# Patient Record
Sex: Female | Born: 1940 | Race: White | Hispanic: No | Smoking: Never smoker
Health system: Southern US, Community
[De-identification: ages and names within clinical notes are randomized; demographics above are authoritative.]

## PROBLEM LIST (undated history)

## (undated) DIAGNOSIS — N200 Calculus of kidney: Secondary | ICD-10-CM

## (undated) DIAGNOSIS — J449 Chronic obstructive pulmonary disease, unspecified: Secondary | ICD-10-CM

## (undated) DIAGNOSIS — M1990 Unspecified osteoarthritis, unspecified site: Secondary | ICD-10-CM

## (undated) DIAGNOSIS — I38 Endocarditis, valve unspecified: Secondary | ICD-10-CM

## (undated) DIAGNOSIS — J45909 Unspecified asthma, uncomplicated: Secondary | ICD-10-CM

## (undated) DIAGNOSIS — I8390 Asymptomatic varicose veins of unspecified lower extremity: Secondary | ICD-10-CM

## (undated) DIAGNOSIS — I10 Essential (primary) hypertension: Secondary | ICD-10-CM

## (undated) DIAGNOSIS — C801 Malignant (primary) neoplasm, unspecified: Secondary | ICD-10-CM

## (undated) DIAGNOSIS — E079 Disorder of thyroid, unspecified: Secondary | ICD-10-CM

## (undated) DIAGNOSIS — G4730 Sleep apnea, unspecified: Secondary | ICD-10-CM

## (undated) HISTORY — DX: Essential (primary) hypertension: I10

## (undated) HISTORY — PX: TONSILLECTOMY: SUR1361

## (undated) HISTORY — DX: Chronic obstructive pulmonary disease, unspecified: J44.9

## (undated) HISTORY — PX: CATARACT EXTRACTION: SUR2

## (undated) HISTORY — PX: VEIN SURGERY: SHX48

## (undated) HISTORY — DX: Disorder of thyroid, unspecified: E07.9

## (undated) HISTORY — DX: Asymptomatic varicose veins of unspecified lower extremity: I83.90

## (undated) HISTORY — DX: Calculus of kidney: N20.0

## (undated) HISTORY — DX: Unspecified osteoarthritis, unspecified site: M19.90

## (undated) HISTORY — DX: Sleep apnea, unspecified: G47.30

## (undated) HISTORY — PX: OTHER SURGICAL HISTORY: SHX169

## (undated) HISTORY — DX: Endocarditis, valve unspecified: I38

---

## 2005-01-22 ENCOUNTER — Ambulatory Visit: Payer: Self-pay | Admitting: Family Medicine

## 2005-07-02 ENCOUNTER — Ambulatory Visit: Payer: Self-pay | Admitting: Family Medicine

## 2005-10-27 ENCOUNTER — Emergency Department: Payer: Self-pay | Admitting: Emergency Medicine

## 2005-10-29 ENCOUNTER — Ambulatory Visit: Payer: Self-pay | Admitting: Family Medicine

## 2006-09-23 ENCOUNTER — Ambulatory Visit: Payer: Self-pay | Admitting: Family Medicine

## 2006-12-17 ENCOUNTER — Ambulatory Visit: Payer: Self-pay | Admitting: Unknown Physician Specialty

## 2008-02-10 ENCOUNTER — Ambulatory Visit: Payer: Self-pay | Admitting: Family Medicine

## 2008-09-06 ENCOUNTER — Ambulatory Visit: Payer: Self-pay | Admitting: Urology

## 2008-09-11 ENCOUNTER — Ambulatory Visit: Payer: Self-pay | Admitting: Urology

## 2008-09-14 ENCOUNTER — Ambulatory Visit: Payer: Self-pay | Admitting: Urology

## 2008-09-19 ENCOUNTER — Ambulatory Visit: Payer: Self-pay | Admitting: Urology

## 2008-09-22 ENCOUNTER — Ambulatory Visit: Payer: Self-pay | Admitting: Urology

## 2008-09-27 ENCOUNTER — Ambulatory Visit: Payer: Self-pay | Admitting: Urology

## 2008-09-29 ENCOUNTER — Ambulatory Visit: Payer: Self-pay | Admitting: Urology

## 2008-10-12 ENCOUNTER — Ambulatory Visit: Payer: Self-pay | Admitting: Urology

## 2008-11-06 ENCOUNTER — Ambulatory Visit: Payer: Self-pay | Admitting: Urology

## 2008-12-05 ENCOUNTER — Ambulatory Visit: Payer: Self-pay | Admitting: Unknown Physician Specialty

## 2008-12-12 ENCOUNTER — Ambulatory Visit: Payer: Self-pay | Admitting: Unknown Physician Specialty

## 2009-01-24 LAB — HM PAP SMEAR: HM Pap smear: NORMAL

## 2009-02-13 ENCOUNTER — Ambulatory Visit: Payer: Self-pay | Admitting: Family Medicine

## 2009-05-08 ENCOUNTER — Ambulatory Visit: Payer: Self-pay | Admitting: Urology

## 2009-10-22 ENCOUNTER — Ambulatory Visit: Payer: Self-pay | Admitting: Family Medicine

## 2009-12-26 ENCOUNTER — Ambulatory Visit: Payer: Self-pay | Admitting: Unknown Physician Specialty

## 2010-02-27 ENCOUNTER — Ambulatory Visit: Payer: Self-pay | Admitting: Internal Medicine

## 2010-03-26 ENCOUNTER — Inpatient Hospital Stay: Payer: Self-pay | Admitting: Internal Medicine

## 2010-06-10 ENCOUNTER — Ambulatory Visit: Payer: Self-pay | Admitting: Physician Assistant

## 2010-06-19 ENCOUNTER — Ambulatory Visit: Payer: Self-pay | Admitting: Unknown Physician Specialty

## 2010-06-21 LAB — PATHOLOGY REPORT

## 2010-09-12 ENCOUNTER — Ambulatory Visit: Payer: Self-pay | Admitting: Unknown Physician Specialty

## 2011-03-18 ENCOUNTER — Ambulatory Visit: Payer: Self-pay | Admitting: Internal Medicine

## 2011-03-31 ENCOUNTER — Encounter: Payer: Self-pay | Admitting: Internal Medicine

## 2011-04-11 ENCOUNTER — Encounter: Payer: Self-pay | Admitting: Internal Medicine

## 2011-05-11 ENCOUNTER — Encounter: Payer: Self-pay | Admitting: Internal Medicine

## 2011-07-02 ENCOUNTER — Ambulatory Visit (INDEPENDENT_AMBULATORY_CARE_PROVIDER_SITE_OTHER): Payer: Medicare Other | Admitting: Internal Medicine

## 2011-07-02 ENCOUNTER — Encounter: Payer: Self-pay | Admitting: Internal Medicine

## 2011-07-02 ENCOUNTER — Telehealth: Payer: Self-pay | Admitting: Internal Medicine

## 2011-07-02 DIAGNOSIS — E039 Hypothyroidism, unspecified: Secondary | ICD-10-CM | POA: Insufficient documentation

## 2011-07-02 DIAGNOSIS — R319 Hematuria, unspecified: Secondary | ICD-10-CM

## 2011-07-02 DIAGNOSIS — Z136 Encounter for screening for cardiovascular disorders: Secondary | ICD-10-CM

## 2011-07-02 DIAGNOSIS — E669 Obesity, unspecified: Secondary | ICD-10-CM

## 2011-07-02 DIAGNOSIS — K219 Gastro-esophageal reflux disease without esophagitis: Secondary | ICD-10-CM

## 2011-07-02 DIAGNOSIS — R42 Dizziness and giddiness: Secondary | ICD-10-CM | POA: Insufficient documentation

## 2011-07-02 LAB — COMPREHENSIVE METABOLIC PANEL
ALT: 23 U/L (ref 0–35)
AST: 31 U/L (ref 0–37)
Alkaline Phosphatase: 109 U/L (ref 39–117)
CO2: 27 mEq/L (ref 19–32)
Sodium: 142 mEq/L (ref 135–145)
Total Bilirubin: 0.7 mg/dL (ref 0.3–1.2)
Total Protein: 6.9 g/dL (ref 6.0–8.3)

## 2011-07-02 LAB — CBC WITH DIFFERENTIAL/PLATELET
Basophils Relative: 0.4 % (ref 0.0–3.0)
Eosinophils Absolute: 0.1 10*3/uL (ref 0.0–0.7)
Eosinophils Relative: 1.9 % (ref 0.0–5.0)
Hemoglobin: 14 g/dL (ref 12.0–15.0)
MCHC: 34 g/dL (ref 30.0–36.0)
MCV: 92.1 fl (ref 78.0–100.0)
Monocytes Absolute: 0.7 10*3/uL (ref 0.1–1.0)
Neutro Abs: 4.6 10*3/uL (ref 1.4–7.7)
RBC: 4.48 Mil/uL (ref 3.87–5.11)

## 2011-07-02 LAB — POCT URINALYSIS DIPSTICK
Bilirubin, UA: NEGATIVE
Blood, UA: NEGATIVE
Glucose, UA: NEGATIVE
Ketones, UA: NEGATIVE
Spec Grav, UA: 1.025

## 2011-07-02 MED ORDER — MELOXICAM 15 MG PO TABS: 15.0000 mg | ORAL_TABLET | Freq: Every day | ORAL | Status: DC

## 2011-07-02 MED ORDER — OMEPRAZOLE 20 MG PO CPDR: 40.0000 mg | DELAYED_RELEASE_CAPSULE | Freq: Two times a day (BID) | ORAL | Status: DC

## 2011-07-02 MED ORDER — LEVOTHYROXINE SODIUM 75 MCG PO TABS: 75.0000 ug | ORAL_TABLET | Freq: Every day | ORAL | Status: DC

## 2011-07-02 NOTE — Progress Notes (Signed)
Patient was referred to Dr Gwen Pounds on 07/09/11 at 11:15, I spoke with Lupita Leash at [facility] Patient is aware of appt./slj

## 2011-07-02 NOTE — Progress Notes (Signed)
Subjective:    Patient ID: Darlene Robbins, female    DOB: [birth year], 70 y.o.   MRN: [id number]  HPI Comments: Dizziness -  Patient complains of an episode of lightheadedness or dizziness. This was a single event. She reports sensation of lightheadedness upon waking. Feeling that she might "fall over." She was diaphoretic during this time. She denies any chest pain or palpitations. She also denies nausea, vomiting, or diarrhea. She took her pulse during this event and noted her pulse to be below 40. She reports that her symptoms gradually improved and she did not seek medical care for this event. She has been followed in the past by Dr. Gwen Pounds from cardiology. But has not had recent followup with him. She has not had any recurrent events.   Hypothyroidism - Patient with hypothyroidism. She reports some fatigue. But denies hot or cold intolerance, and constipation. Her most recent TSH was normal.  Abdominal Pain This is a chronic problem. The current episode started more than 1 year ago. The problem occurs intermittently. The problem has been waxing and waning. The pain is located in the periumbilical region and suprapubic region. The pain is moderate. The quality of the pain is aching and cramping. The abdominal pain does not radiate. Associated symptoms include dysuria (occasional) and nausea. Pertinent negatives include no arthralgias, constipation, diarrhea, fever, frequency, headaches, hematochezia, hematuria, myalgias, vomiting or weight loss. The pain is aggravated by nothing. The pain is relieved by nothing. She has tried antacids, antibiotics and proton pump inhibitors for the symptoms. The treatment provided mild relief. Prior diagnostic workup includes GI consult. kidney stones and bladder infections    Outpatient Encounter Prescriptions as of 07/02/2011  Medication Sig Dispense Refill  . albuterol (VENTOLIN HFA) 108 (90 BASE) MCG/ACT inhaler Inhale 2 puffs into the lungs daily.        .  diclofenac sodium (VOLTAREN) 1 % GEL Apply 1 application topically 2 (two) times daily as needed.        . fish oil-omega-3 fatty acids 1000 MG capsule Take 2 g by mouth daily.        . furosemide (LASIX) 20 MG tablet Take 20 mg by mouth daily as needed.        Marland Kitchen levothyroxine (SYNTHROID, LEVOTHROID) 75 MCG tablet Take 1 tablet (75 mcg total) by mouth daily.  90 tablet  4  . meloxicam (MOBIC) 15 MG tablet Take 1 tablet (15 mg total) by mouth daily.  90 tablet  4  . metoCLOPramide (REGLAN) 5 MG tablet Take 5 mg by mouth 2 (two) times daily as needed.           Review of Systems  Constitutional: Positive for diaphoresis and fatigue. Negative for fever, chills, weight loss, appetite change and unexpected weight change.  HENT: Negative for ear pain, congestion, sore throat, trouble swallowing, neck pain, voice change and sinus pressure.   Eyes: Negative for visual disturbance.  Respiratory: Negative for cough, chest tightness, shortness of breath, wheezing and stridor.   Cardiovascular: Negative for chest pain, palpitations and leg swelling.       Bradycardia symptomatic, to 40s  Gastrointestinal: Positive for nausea and abdominal pain. Negative for vomiting, diarrhea, constipation, blood in stool, hematochezia, abdominal distention and anal bleeding.  Genitourinary: Positive for dysuria (occasional). Negative for urgency, frequency, hematuria and flank pain.  Musculoskeletal: Negative for myalgias, arthralgias and gait problem.  Skin: Negative for color change and rash.  Neurological: Positive for dizziness. Negative for headaches.  Hematological: Negative  for adenopathy. Does not bruise/bleed easily.  Psychiatric/Behavioral: Negative for sleep disturbance and dysphoric mood. The patient is not nervous/anxious.    BP 158/86  Pulse 73  Temp(Src) 98.2 F (36.8 C) (Oral)  Resp 12  Ht 5' 4.5" (1.638 m)  Wt 262 lb (118.842 kg)  BMI 44.28 kg/m2  SpO2 100%     Objective:   Physical Exam    Constitutional: She is oriented to person, place, and time. She appears well-developed and well-nourished. No distress.  HENT:  Head: Normocephalic and atraumatic.  Nose: Nose normal.  Mouth/Throat: No oropharyngeal exudate.  Eyes: Conjunctivae are normal. Pupils are equal, round, and reactive to light. Right eye exhibits no discharge. Left eye exhibits no discharge. No scleral icterus.  Neck: Normal range of motion. Neck supple. No tracheal deviation present. No thyromegaly present.  Cardiovascular: Normal rate, regular rhythm, normal heart sounds and intact distal pulses.  Exam reveals no gallop and no friction rub.   No murmur heard. Pulmonary/Chest: Effort normal and breath sounds normal. No respiratory distress. She has no wheezes. She has no rales. She exhibits no tenderness.  Abdominal: Soft. She exhibits no distension. There is no tenderness. There is no guarding.  Musculoskeletal: Normal range of motion. She exhibits no edema and no tenderness.  Lymphadenopathy:    She has no cervical adenopathy.  Neurological: She is alert and oriented to person, place, and time. No cranial nerve deficit. She exhibits normal muscle tone. Coordination normal.  Skin: Skin is warm and dry. No rash noted. She is not diaphoretic. No erythema. No pallor.  Psychiatric: She has a normal mood and affect. Her behavior is normal. Judgment and thought content normal.          Assesment & Plan:  1. Lightheadedness -  Patient with a single episode of lightheadedness, during which time she describes being bradycardic to the 40s. She has been followed by Dr. Gwen Pounds from cardiology in the past. We will plan to set her up for followup appointment with him for evaluation to include a Holter monitor. She had an EKG performed in clinic today which was normal. She will also have electrolytes and thyroid lab work today. She will followup in our clinic in 2 weeks.  2. Hypothyroidism - Patient with hypothyroidism  which has been stable. Will recheck TSH with labs today.  3. Abdominal pain - Patient has chronic intermittent abdominal pain. She has no symptoms to suggest GI etiology and has had extensive workup in the past. She has a history of urinary tract infections in the past and has mild dysuria today. We will repeat a urinalysis and urine culture today.

## 2011-07-03 NOTE — Progress Notes (Signed)
Addended by: Melody Comas L on: 07/03/2011 11:45 AM   Modules accepted: Orders

## 2011-07-04 NOTE — Telephone Encounter (Signed)
I have faxed notes to [facility] to Dr. Vanessa Ralphs.,MD.  Ph  [phone].

## 2011-07-06 LAB — URINE CULTURE

## 2011-07-10 ENCOUNTER — Other Ambulatory Visit: Payer: Self-pay | Admitting: *Deleted

## 2011-07-10 MED ORDER — SULFAMETHOXAZOLE-TRIMETHOPRIM 800-160 MG PO TABS: 1.0000 | ORAL_TABLET | Freq: Two times a day (BID) | ORAL | Status: AC

## 2011-07-30 ENCOUNTER — Telehealth: Payer: Self-pay | Admitting: Internal Medicine

## 2011-07-30 NOTE — Telephone Encounter (Signed)
Pt received generic synriod from her mail order ph[facility].  Dr Dan Humphreys stated she did not want pt to take generic.  In order for her to returned this she needs a note from dr walker stated that she doesn't want her to take the generic synroid.  Then she needs another rx stated brand name only of synroid.

## 2011-07-31 ENCOUNTER — Encounter: Payer: Self-pay | Admitting: Internal Medicine

## 2011-07-31 NOTE — Telephone Encounter (Signed)
Fine to call in brand name synthroid and/or write a letter if she needs it.

## 2011-08-01 ENCOUNTER — Other Ambulatory Visit: Payer: Self-pay | Admitting: Internal Medicine

## 2011-08-01 MED ORDER — LEVOTHYROXINE SODIUM 75 MCG PO TABS: 75.0000 ug | ORAL_TABLET | Freq: Every day | ORAL | Status: DC

## 2011-08-04 ENCOUNTER — Encounter: Payer: Self-pay | Admitting: Internal Medicine

## 2011-08-04 ENCOUNTER — Ambulatory Visit (INDEPENDENT_AMBULATORY_CARE_PROVIDER_SITE_OTHER): Payer: Medicare Other | Admitting: Internal Medicine

## 2011-08-04 VITALS — BP 139/78 | HR 59 | Temp 97.6°F | Resp 12 | Ht 67.0 in | Wt 262.5 lb

## 2011-08-04 DIAGNOSIS — Z23 Encounter for immunization: Secondary | ICD-10-CM

## 2011-08-04 DIAGNOSIS — E785 Hyperlipidemia, unspecified: Secondary | ICD-10-CM

## 2011-08-04 DIAGNOSIS — R42 Dizziness and giddiness: Secondary | ICD-10-CM

## 2011-08-04 DIAGNOSIS — R300 Dysuria: Secondary | ICD-10-CM

## 2011-08-04 DIAGNOSIS — F4321 Adjustment disorder with depressed mood: Secondary | ICD-10-CM

## 2011-08-04 LAB — POCT URINALYSIS DIPSTICK
Nitrite, UA: NEGATIVE
Protein, UA: NEGATIVE
Spec Grav, UA: 1.01
Urobilinogen, UA: 0.2

## 2011-08-04 MED ORDER — SULFAMETHOXAZOLE-TRIMETHOPRIM 800-160 MG PO TABS: 1.0000 | ORAL_TABLET | Freq: Two times a day (BID) | ORAL | Status: DC

## 2011-08-04 MED ORDER — SULFAMETHOXAZOLE-TRIMETHOPRIM 800-160 MG PO TABS: 1.0000 | ORAL_TABLET | Freq: Two times a day (BID) | ORAL | Status: AC

## 2011-08-04 NOTE — Progress Notes (Signed)
Subjective:    Patient ID: Darlene Robbins, female    DOB: [birth year], 71 y.o.   MRN: [id number]  HPI Darlene Robbins is a 70 year old female who presents to followup dizziness. In the interim since her last visit she was seen by Dr. Gwen Pounds with cardiology and underwent both Holter monitor and echocardiogram which she is scheduled to followup today. She reports her dizziness is unchanged. She continues to have intermittent periods of dizziness described as lightheadedness. She denies any chest pain, diaphoresis, palpitations.  Also in the interim since her last visit she was seen for cognitive evaluation. Her cognitive evaluation was completely normal. The interviewer did notice some mild depression. We discussed this in detail today. Darlene Robbins reports a recent history of several friends passing away. She also reports some anxiety associated with caring for her grandchildren. She does not feel this is severe enough toward medication.  Darlene Robbins reports that after her last visit her symptoms of dysuria and frequency completely resolved with treatment with Bactrim. However, her symptoms have recurred over the last couple of days. She reports having to use the restroom up to every 2 hours. She denies any fever, chills, flank pain.  Outpatient Encounter Prescriptions as of 08/04/2011  Medication Sig Dispense Refill  . albuterol (VENTOLIN HFA) 108 (90 BASE) MCG/ACT inhaler Inhale 2 puffs into the lungs daily.        . diclofenac sodium (VOLTAREN) 1 % GEL Apply 1 application topically 2 (two) times daily as needed.        . fish oil-omega-3 fatty acids 1000 MG capsule Take 2 g by mouth daily.        . furosemide (LASIX) 20 MG tablet Take 20 mg by mouth daily as needed.        Marland Kitchen levothyroxine (SYNTHROID) 75 MCG tablet Take 1 tablet (75 mcg total) by mouth daily.  30 tablet  11  . meloxicam (MOBIC) 15 MG tablet Take 1 tablet (15 mg total) by mouth daily.  90 tablet  4  . metoCLOPramide (REGLAN) 5 MG tablet Take  5 mg by mouth 2 (two) times daily as needed.        . nystatin (MYCOSTATIN) cream       . omeprazole (PRILOSEC) 20 MG capsule Take 2 capsules (40 mg total) by mouth 2 (two) times daily.  180 capsule  4  . sulfamethoxazole-trimethoprim (SEPTRA DS) 800-160 MG per tablet Take 1 tablet by mouth 2 (two) times daily.  14 tablet  0    Review of Systems  Constitutional: Negative for fever, chills, appetite change, fatigue and unexpected weight change.  HENT: Negative for ear pain, congestion, sore throat, trouble swallowing, neck pain, voice change and sinus pressure.   Eyes: Negative for visual disturbance.  Respiratory: Negative for cough, shortness of breath, wheezing and stridor.   Cardiovascular: Negative for chest pain, palpitations and leg swelling.  Gastrointestinal: Negative for nausea, vomiting, abdominal pain, diarrhea, constipation, blood in stool, abdominal distention and anal bleeding.  Genitourinary: Positive for dysuria and frequency. Negative for flank pain.  Musculoskeletal: Negative for myalgias, arthralgias and gait problem.  Skin: Negative for color change and rash.  Neurological: Positive for dizziness. Negative for headaches.  Hematological: Negative for adenopathy. Does not bruise/bleed easily.  Psychiatric/Behavioral: Positive for dysphoric mood. Negative for suicidal ideas and sleep disturbance. The patient is nervous/anxious.    BP 139/78  Pulse 59  Temp(Src) 97.6 F (36.4 C) (Oral)  Resp 12  Ht 5\' 7"  (1.702 m)  Wt 262 lb 8 oz (119.069 kg)  BMI 41.11 kg/m2  SpO2 99%     Objective:   Physical Exam  Constitutional: She is oriented to person, place, and time. She appears well-developed and well-nourished. No distress.  HENT:  Head: Normocephalic and atraumatic.  Right Ear: External ear normal.  Left Ear: External ear normal.  Nose: Nose normal.  Mouth/Throat: Oropharynx is clear and moist. No oropharyngeal exudate.  Eyes: Conjunctivae are normal. Pupils are  equal, round, and reactive to light. Right eye exhibits no discharge. Left eye exhibits no discharge. No scleral icterus.  Neck: Normal range of motion. Neck supple. No tracheal deviation present. No thyromegaly present.  Cardiovascular: Normal rate, regular rhythm, normal heart sounds and intact distal pulses.  Exam reveals no gallop and no friction rub.   No murmur heard. Pulmonary/Chest: Effort normal and breath sounds normal. No respiratory distress. She has no wheezes. She has no rales. She exhibits no tenderness.  Abdominal: There is no CVA tenderness.  Musculoskeletal: Normal range of motion. She exhibits no edema and no tenderness.  Lymphadenopathy:    She has no cervical adenopathy.  Neurological: She is alert and oriented to person, place, and time. No cranial nerve deficit. She exhibits normal muscle tone. Coordination normal.  Skin: Skin is warm and dry. No rash noted. She is not diaphoretic. No erythema. No pallor.  Psychiatric: She has a normal mood and affect. Her behavior is normal. Judgment and thought content normal.          Assessment & Plan:  1. Dysuria - patient with dysuria. Urine dip is remarkable for leukocytes and blood. Will send urine for culture. Will treat with Bactrim double strength tablet twice daily x7 days. Patient will call or return to clinic if symptoms are not improving.  2. Dizziness - patient with recurrent episodes of dizziness. She has completed cardiac workup including Holter monitor and echocardiogram. She has an appointment this afternoon with Dr. Gwen Pounds for followup. We have asked her to request records from Dr. Philemon Kingdom visit.  3. Memory loss - patient complained of memory loss. We set her up for her cognitive testing. She completed this and cognitive testing showed no evidence of memory loss. They did note a history of mild depression. See below.  4. Anxiety/Depression - discussed today patient's recent life stressors including caring for  her grandchildren and several recent deaths. Patient admits that she has had increased stress recently, however does not feel that medication is worn to at this time. We will continue to revisit this issue. She will return to clinic in one month.

## 2011-08-07 LAB — URINE CULTURE

## 2011-08-07 NOTE — Progress Notes (Signed)
Addended by: Jobie Quaker on: 08/07/2011 11:36 AM   Modules accepted: Orders

## 2011-08-08 ENCOUNTER — Telehealth: Payer: Self-pay | Admitting: *Deleted

## 2011-08-08 LAB — LIPID PANEL
HDL: 45.3 mg/dL (ref >39.00)
LDL Cholesterol: 112 mg/dL — ABNORMAL HIGH (ref 0–99)
Total CHOL/HDL Ratio: 4
Triglycerides: 103 mg/dL (ref 0.0–149.0)

## 2011-08-08 MED ORDER — CIPROFLOXACIN HCL 250 MG PO TABS: 250.0000 mg | ORAL_TABLET | Freq: Two times a day (BID) | ORAL | Status: AC

## 2011-08-08 NOTE — Telephone Encounter (Signed)
Based on her recent kidney function, I think the macrobid is fine. However, if there are concerns, she can take Cipro 250mg  by mouth twice daily x 7 days.

## 2011-08-08 NOTE — Telephone Encounter (Signed)
Patient says that when she went to pick up the macrobid the pharmacist told her that she should not be taking that at her age. She is asking for something different to be called in.

## 2011-08-08 NOTE — Telephone Encounter (Signed)
Patient notified. [facility]

## 2011-09-01 ENCOUNTER — Encounter: Payer: Self-pay | Admitting: Internal Medicine

## 2011-09-04 ENCOUNTER — Encounter: Payer: Self-pay | Admitting: Internal Medicine

## 2011-09-04 ENCOUNTER — Ambulatory Visit (INDEPENDENT_AMBULATORY_CARE_PROVIDER_SITE_OTHER): Payer: Medicare Other | Admitting: Internal Medicine

## 2011-09-04 VITALS — BP 142/82 | HR 52 | Temp 98.0°F | Resp 12 | Ht 67.0 in | Wt 261.0 lb

## 2011-09-04 DIAGNOSIS — N390 Urinary tract infection, site not specified: Secondary | ICD-10-CM

## 2011-09-04 DIAGNOSIS — F419 Anxiety disorder, unspecified: Secondary | ICD-10-CM | POA: Insufficient documentation

## 2011-09-04 DIAGNOSIS — K219 Gastro-esophageal reflux disease without esophagitis: Secondary | ICD-10-CM

## 2011-09-04 DIAGNOSIS — R300 Dysuria: Secondary | ICD-10-CM

## 2011-09-04 DIAGNOSIS — F411 Generalized anxiety disorder: Secondary | ICD-10-CM

## 2011-09-04 DIAGNOSIS — R112 Nausea with vomiting, unspecified: Secondary | ICD-10-CM

## 2011-09-04 DIAGNOSIS — M1990 Unspecified osteoarthritis, unspecified site: Secondary | ICD-10-CM

## 2011-09-04 LAB — POCT URINALYSIS DIPSTICK
Ketones, UA: NEGATIVE
Protein, UA: NEGATIVE
pH, UA: 7

## 2011-09-04 MED ORDER — CITALOPRAM HYDROBROMIDE 20 MG PO TABS: 20.0000 mg | ORAL_TABLET | Freq: Every day | ORAL | Status: DC

## 2011-09-04 MED ORDER — OMEPRAZOLE 40 MG PO CPDR: 40.0000 mg | DELAYED_RELEASE_CAPSULE | Freq: Two times a day (BID) | ORAL | Status: DC

## 2011-09-04 MED ORDER — MELOXICAM 15 MG PO TABS: 15.0000 mg | ORAL_TABLET | Freq: Every day | ORAL | Status: DC

## 2011-09-04 NOTE — Patient Instructions (Signed)
Start celexa today. Follow up in 1 month.

## 2011-09-04 NOTE — Progress Notes (Signed)
Subjective:    Patient ID: Darlene Robbins, female    DOB: 1941/09/19, 70 y.o.   MRN: [id number]  HPI 70 year old female with a history of hypothyroidism presents for followup. Over the last several years, she has had intermittent episodes of spontaneous nausea and vomiting. This has been evaluated both by a local GI physician and at [facility]. She has had upper GI study and endoscopy both of which were normal. She notes that last week she had a recurrent episode of sudden onset nausea and vomiting. This resolved without any intervention. There was no obvious cause of the vomiting. She has not had any recurrent symptoms. She denies any abdominal pain, constipation, fever or chills.  Her primary concern today is a recent increase in anxiety. She notes that she has been short with her temper. She notes that other family members have noticed this as well. She denies any major recent stressors. She does care for several grandchildren on a regular basis. She would like to try medication to help with coping with normal daily stresses. She has occasional dysphoric mood. She denies any suicidal or homicidal ideation.  She also reports several days of dysuria and increased urinary frequency. She denies any fever or chills. She denies any flank pain. She denies any hematuria. Symptoms are consistent with previous urinary tract infections.  Outpatient Encounter Prescriptions as of 09/04/2011  Medication Sig Dispense Refill  . albuterol (VENTOLIN HFA) 108 (90 BASE) MCG/ACT inhaler Inhale 2 puffs into the lungs daily.        . citalopram (CELEXA) 20 MG tablet Take 1 tablet (20 mg total) by mouth daily.  30 tablet  2  . diclofenac sodium (VOLTAREN) 1 % GEL Apply 1 application topically 2 (two) times daily as needed.        . fish oil-omega-3 fatty acids 1000 MG capsule Take 2 g by mouth daily.        . furosemide (LASIX) 20 MG tablet Take 20 mg by mouth daily as needed.        Marland Kitchen levothyroxine (SYNTHROID) 75 MCG tablet  Take 1 tablet (75 mcg total) by mouth daily.  30 tablet  11  . meloxicam (MOBIC) 15 MG tablet Take 1 tablet (15 mg total) by mouth daily.  90 tablet  4  . metoCLOPramide (REGLAN) 5 MG tablet Take 5 mg by mouth 2 (two) times daily as needed.        . nystatin (MYCOSTATIN) cream         Review of Systems  Constitutional: Negative for fever, chills, appetite change, fatigue and unexpected weight change.  HENT: Negative for ear pain, congestion, sore throat, trouble swallowing, neck pain, voice change and sinus pressure.   Eyes: Negative for visual disturbance.  Respiratory: Negative for cough, shortness of breath, wheezing and stridor.   Cardiovascular: Negative for chest pain, palpitations and leg swelling.  Gastrointestinal: Positive for nausea and vomiting. Negative for abdominal pain, diarrhea, constipation, blood in stool, abdominal distention and anal bleeding.  Genitourinary: Negative for dysuria and flank pain.  Musculoskeletal: Negative for myalgias, arthralgias and gait problem.  Skin: Negative for color change and rash.  Neurological: Negative for dizziness and headaches.  Hematological: Negative for adenopathy. Does not bruise/bleed easily.  Psychiatric/Behavioral: Positive for dysphoric mood. Negative for suicidal ideas and sleep disturbance. The patient is nervous/anxious.    BP 142/82  Pulse 52  Temp(Src) 98 F (36.7 C) (Oral)  Resp 12  Ht 5\' 7"  (1.702 m)  Wt 261 lb (  118.389 kg)  BMI 40.88 kg/m2  SpO2 99%     Objective:   Physical Exam  Constitutional: She is oriented to person, place, and time. She appears well-developed and well-nourished. No distress.  HENT:  Head: Normocephalic and atraumatic.  Right Ear: External ear normal.  Left Ear: External ear normal.  Nose: Nose normal.  Mouth/Throat: Oropharynx is clear and moist. No oropharyngeal exudate.  Eyes: Conjunctivae are normal. Pupils are equal, round, and reactive to light. Right eye exhibits no discharge.  Left eye exhibits no discharge. No scleral icterus.  Neck: Normal range of motion. Neck supple. No tracheal deviation present. No thyromegaly present.  Cardiovascular: Normal rate, regular rhythm, normal heart sounds and intact distal pulses.  Exam reveals no gallop and no friction rub.   No murmur heard. Pulmonary/Chest: Effort normal and breath sounds normal. No respiratory distress. She has no wheezes. She has no rales. She exhibits no tenderness.  Musculoskeletal: Normal range of motion. She exhibits no edema and no tenderness.  Lymphadenopathy:    She has no cervical adenopathy.  Neurological: She is alert and oriented to person, place, and time. No cranial nerve deficit. She exhibits normal muscle tone. Coordination normal.  Skin: Skin is warm and dry. No rash noted. She is not diaphoretic. No erythema. No pallor.  Psychiatric: She has a normal mood and affect. Her behavior is normal. Judgment and thought content normal.          Assessment & Plan:  1. Anxiety - Offered support today. Will try adding citalopram to see if any improvement. Patient will take this daily and will return to clinic in one month for reevaluation. She will call or return to clinic sooner should she develop any problems at this medication.  2. Dysuria - urine positive for leukocytes and blood. Will treat with Bactrim double strength tablet twice daily x7 days. Will send urine for culture. Patient will call or return to clinic if symptoms are not improving.  3. Nausea/Vomiting -patient has had intermittent episodes of nausea and vomiting over the last several years. She has had extensive evaluation both locally and at [facility] for this. Question if she may have achalasia. Given that the episodes are so infrequent, will not add any additional medications at this time. She uses Reglan and Zofran as needed during acute episodes.

## 2011-09-08 ENCOUNTER — Other Ambulatory Visit: Payer: Self-pay | Admitting: Internal Medicine

## 2011-09-08 DIAGNOSIS — K219 Gastro-esophageal reflux disease without esophagitis: Secondary | ICD-10-CM

## 2011-09-08 MED ORDER — OMEPRAZOLE 40 MG PO CPDR: 40.0000 mg | DELAYED_RELEASE_CAPSULE | Freq: Two times a day (BID) | ORAL | Status: DC

## 2011-09-11 ENCOUNTER — Other Ambulatory Visit: Payer: Self-pay | Admitting: *Deleted

## 2011-09-11 MED ORDER — CIPROFLOXACIN HCL 500 MG PO TABS: 500.0000 mg | ORAL_TABLET | Freq: Two times a day (BID) | ORAL | Status: DC

## 2011-09-11 NOTE — Progress Notes (Signed)
Addended by: Ronna Polio A on: 09/11/2011 04:51 PM   Modules accepted: Orders

## 2011-09-12 ENCOUNTER — Telehealth: Payer: Self-pay | Admitting: Internal Medicine

## 2011-09-12 MED ORDER — CIPROFLOXACIN HCL 500 MG PO TABS: 500.0000 mg | ORAL_TABLET | Freq: Two times a day (BID) | ORAL | Status: AC

## 2011-09-12 NOTE — Telephone Encounter (Signed)
Done - needs to go to [facility] in [place].

## 2011-09-12 NOTE — Telephone Encounter (Signed)
Patient wants her cipro 500mg  called into [facility] in [place] and cancel the order at [facility].

## 2011-09-18 ENCOUNTER — Encounter: Payer: Self-pay | Admitting: Internal Medicine

## 2011-10-07 ENCOUNTER — Ambulatory Visit: Payer: Medicare Other | Admitting: Internal Medicine

## 2011-10-09 ENCOUNTER — Ambulatory Visit: Payer: Self-pay | Admitting: Urology

## 2011-10-17 ENCOUNTER — Telehealth: Payer: Self-pay | Admitting: *Deleted

## 2011-10-17 ENCOUNTER — Ambulatory Visit (INDEPENDENT_AMBULATORY_CARE_PROVIDER_SITE_OTHER): Payer: Medicare Other | Admitting: Internal Medicine

## 2011-10-17 ENCOUNTER — Encounter: Payer: Self-pay | Admitting: Internal Medicine

## 2011-10-17 VITALS — BP 140/78 | HR 68 | Temp 98.8°F | Wt 260.0 lb

## 2011-10-17 DIAGNOSIS — N390 Urinary tract infection, site not specified: Secondary | ICD-10-CM

## 2011-10-17 DIAGNOSIS — IMO0001 Reserved for inherently not codable concepts without codable children: Secondary | ICD-10-CM

## 2011-10-17 DIAGNOSIS — F411 Generalized anxiety disorder: Secondary | ICD-10-CM

## 2011-10-17 DIAGNOSIS — F419 Anxiety disorder, unspecified: Secondary | ICD-10-CM

## 2011-10-17 LAB — POCT URINALYSIS DIPSTICK
Bilirubin, UA: NEGATIVE
Ketones, UA: NEGATIVE
pH, UA: 6

## 2011-10-17 MED ORDER — CIPROFLOXACIN HCL 250 MG PO TABS: 250.0000 mg | ORAL_TABLET | Freq: Two times a day (BID) | ORAL | Status: AC

## 2011-10-17 NOTE — Telephone Encounter (Signed)
Message copied by Vernie Murders on Fri Oct 17, 2011  1:08 PM ------      Message from: Ronna Polio A      Created: Fri Oct 17, 2011 10:57 AM       Urine showed few leukocytes.  I have sent urine for culture. Until it is back, I would like to start Cipro 250mg  po bid x 7 days

## 2011-10-17 NOTE — Telephone Encounter (Signed)
Patient informed. 

## 2011-10-17 NOTE — Progress Notes (Signed)
Subjective:    Patient ID: Darlene Robbins, female    DOB: 06/18/1941, 70 y.o.   MRN: [id number]  HPI 70-year-old female with a history of anxiety/depression and recurrent urinary tract infections presents for followup. At her last visit, she was started on Celexa. She reports improvement in her mood and stress level with this medication. She reports feeling less irritable. She denies any side effects from the medications. She reports full compliance with the Celexa.  She notes that over the last week she has had some increased urinary frequency. She notes that at time she has urinated every 2 hours. She denies any burning with urination. She denies any flank pain. She denies any fever or chills. Next   Outpatient Encounter Prescriptions as of 10/17/2011  Medication Sig Dispense Refill  . albuterol (VENTOLIN HFA) 108 (90 BASE) MCG/ACT inhaler Inhale 2 puffs into the lungs daily.        . citalopram (CELEXA) 20 MG tablet Take 1 tablet (20 mg total) by mouth daily.  30 tablet  2  . diclofenac sodium (VOLTAREN) 1 % GEL Apply 1 application topically 2 (two) times daily as needed.        . fish oil-omega-3 fatty acids 1000 MG capsule Take 2 g by mouth daily.        . furosemide (LASIX) 20 MG tablet Take 20 mg by mouth daily as needed.        Marland Kitchen levothyroxine (SYNTHROID) 75 MCG tablet Take 1 tablet (75 mcg total) by mouth daily.  30 tablet  11  . meloxicam (MOBIC) 15 MG tablet Take 1 tablet (15 mg total) by mouth daily.  90 tablet  4  . metoCLOPramide (REGLAN) 5 MG tablet Take 5 mg by mouth 2 (two) times daily as needed.        . nystatin (MYCOSTATIN) cream       . omeprazole (PRILOSEC) 40 MG capsule Take 1 capsule (40 mg total) by mouth 2 (two) times daily.  180 capsule  4    Review of Systems  Constitutional: Negative for fever, chills, appetite change, fatigue and unexpected weight change.  Eyes: Negative for visual disturbance.  Respiratory: Negative for cough and shortness of breath.     Cardiovascular: Negative for chest pain, palpitations and leg swelling.  Genitourinary: Positive for urgency and frequency. Negative for dysuria, flank pain and pelvic pain.  Musculoskeletal: Negative for myalgias, arthralgias and gait problem.  Skin: Negative for color change and rash.  Neurological: Negative for dizziness and headaches.  Hematological: Negative for adenopathy. Does not bruise/bleed easily.  Psychiatric/Behavioral: Negative for suicidal ideas, sleep disturbance and dysphoric mood. The patient is not nervous/anxious.    BP 140/78  Pulse 68  Temp(Src) 98.8 F (37.1 C) (Oral)  Wt 260 lb (117.935 kg)  SpO2 98%     Objective:   Physical Exam  Constitutional: She is oriented to person, place, and time. She appears well-developed and well-nourished. No distress.  HENT:  Head: Normocephalic and atraumatic.  Right Ear: External ear normal.  Left Ear: External ear normal.  Nose: Nose normal.  Mouth/Throat: Oropharynx is clear and moist. No oropharyngeal exudate.  Eyes: Conjunctivae are normal. Pupils are equal, round, and reactive to light. Right eye exhibits no discharge. Left eye exhibits no discharge. No scleral icterus.  Neck: Normal range of motion. Neck supple. No tracheal deviation present. No thyromegaly present.  Cardiovascular: Normal rate, regular rhythm, normal heart sounds and intact distal pulses.  Exam reveals no gallop and  no friction rub.   No murmur heard. Pulmonary/Chest: Effort normal and breath sounds normal. No respiratory distress. She has no wheezes. She has no rales. She exhibits no tenderness.  Abdominal: There is no CVA tenderness.  Musculoskeletal: Normal range of motion. She exhibits no edema and no tenderness.  Lymphadenopathy:    She has no cervical adenopathy.  Neurological: She is alert and oriented to person, place, and time. No cranial nerve deficit. She exhibits normal muscle tone. Coordination normal.  Skin: Skin is warm and dry. No  rash noted. She is not diaphoretic. No erythema. No pallor.  Psychiatric: She has a normal mood and affect. Her behavior is normal. Judgment and thought content normal.          Assessment & Plan:  1. Anxiety/Depression - improved with Celexa. We'll continue to monitor. Patient will return to clinic in 3 months or sooner as needed.  2. Urinary tract infection -urinalysis today is remarkable for leukocytes. Will send urine for culture. Will treat patient with Cipro 250 mg twice daily for 7 days. She also has followup with urology next week.

## 2011-10-19 LAB — URINE CULTURE
Colony Count: NO GROWTH
Organism ID, Bacteria: NO GROWTH

## 2011-11-17 ENCOUNTER — Emergency Department: Payer: Self-pay | Admitting: *Deleted

## 2011-11-17 DIAGNOSIS — R0989 Other specified symptoms and signs involving the circulatory and respiratory systems: Secondary | ICD-10-CM | POA: Diagnosis not present

## 2011-11-17 DIAGNOSIS — J018 Other acute sinusitis: Secondary | ICD-10-CM | POA: Diagnosis not present

## 2011-11-17 DIAGNOSIS — J209 Acute bronchitis, unspecified: Secondary | ICD-10-CM | POA: Diagnosis not present

## 2011-11-17 DIAGNOSIS — R05 Cough: Secondary | ICD-10-CM | POA: Diagnosis not present

## 2011-11-17 DIAGNOSIS — Z79899 Other long term (current) drug therapy: Secondary | ICD-10-CM | POA: Diagnosis not present

## 2011-11-17 DIAGNOSIS — J45909 Unspecified asthma, uncomplicated: Secondary | ICD-10-CM | POA: Diagnosis not present

## 2011-11-17 DIAGNOSIS — IMO0002 Reserved for concepts with insufficient information to code with codable children: Secondary | ICD-10-CM | POA: Diagnosis not present

## 2011-11-17 LAB — URINALYSIS, COMPLETE
Glucose,UR: NEGATIVE mg/dL (ref 0–75)
Ph: 6 (ref 4.5–8.0)
Protein: NEGATIVE
WBC UR: <1 /HPF (ref 0–5)

## 2011-11-17 LAB — CBC
HCT: 43.2 % (ref 35.0–47.0)
MCH: 31.2 pg (ref 26.0–34.0)
MCHC: 33.7 g/dL (ref 32.0–36.0)
MCV: 92 fL (ref 80–100)
Platelet: 185 10*3/uL (ref 150–440)
RBC: 4.68 10*6/uL (ref 3.80–5.20)
RDW: 12.8 % (ref 11.5–14.5)
WBC: 13.3 10*3/uL — ABNORMAL HIGH (ref 3.6–11.0)

## 2011-11-17 LAB — COMPREHENSIVE METABOLIC PANEL
Anion Gap: 7 (ref 7–16)
Calcium, Total: 9.1 mg/dL (ref 8.5–10.1)
Co2: 29 mmol/L (ref 21–32)
EGFR (African American): >60
EGFR (Non-African Amer.): >60
Osmolality: 274 (ref 275–301)
Potassium: 4.8 mmol/L (ref 3.5–5.1)
SGPT (ALT): 44 U/L
Sodium: 136 mmol/L (ref 136–145)

## 2011-11-20 ENCOUNTER — Telehealth: Payer: Self-pay | Admitting: *Deleted

## 2011-11-20 NOTE — Telephone Encounter (Signed)
Pt c/o severe pain in legs and "swelling" of veins. Advised ER to f/u DVT, pt understands and will f/u w/us after ER.

## 2011-11-20 NOTE — Telephone Encounter (Signed)
Agree 

## 2011-11-24 DIAGNOSIS — J209 Acute bronchitis, unspecified: Secondary | ICD-10-CM | POA: Diagnosis not present

## 2011-11-24 DIAGNOSIS — R05 Cough: Secondary | ICD-10-CM | POA: Diagnosis not present

## 2011-12-09 ENCOUNTER — Encounter: Payer: Self-pay | Admitting: Internal Medicine

## 2011-12-15 ENCOUNTER — Telehealth: Payer: Self-pay | Admitting: Internal Medicine

## 2011-12-15 DIAGNOSIS — K219 Gastro-esophageal reflux disease without esophagitis: Secondary | ICD-10-CM

## 2011-12-15 DIAGNOSIS — R350 Frequency of micturition: Secondary | ICD-10-CM | POA: Diagnosis not present

## 2011-12-15 DIAGNOSIS — F419 Anxiety disorder, unspecified: Secondary | ICD-10-CM

## 2011-12-15 MED ORDER — OMEPRAZOLE 40 MG PO CPDR: 40.0000 mg | DELAYED_RELEASE_CAPSULE | Freq: Two times a day (BID) | ORAL | Status: DC

## 2011-12-15 MED ORDER — CITALOPRAM HYDROBROMIDE 20 MG PO TABS: 20.0000 mg | ORAL_TABLET | Freq: Every day | ORAL | Status: DC

## 2011-12-15 NOTE — Telephone Encounter (Signed)
Patient informed of Rf's

## 2011-12-25 ENCOUNTER — Telehealth: Payer: Self-pay | Admitting: *Deleted

## 2011-12-25 NOTE — Telephone Encounter (Signed)
Insurance requires PA on Omeprazole 40 mg 1 bid. PA # [id number] ID# [id number]

## 2012-01-05 DIAGNOSIS — L03211 Cellulitis of face: Secondary | ICD-10-CM | POA: Diagnosis not present

## 2012-01-05 DIAGNOSIS — L0201 Cutaneous abscess of face: Secondary | ICD-10-CM | POA: Diagnosis not present

## 2012-01-05 DIAGNOSIS — K1120 Sialoadenitis, unspecified: Secondary | ICD-10-CM | POA: Diagnosis not present

## 2012-01-09 NOTE — Telephone Encounter (Signed)
Form requested, being faxed today

## 2012-01-15 ENCOUNTER — Encounter: Payer: Medicare Other | Admitting: Internal Medicine

## 2012-01-15 DIAGNOSIS — Z0289 Encounter for other administrative examinations: Secondary | ICD-10-CM

## 2012-01-15 NOTE — Telephone Encounter (Signed)
Completed and response from insurance was that it was de

## 2012-01-15 NOTE — Telephone Encounter (Signed)
Completed and response from insurance was denial. Need to discuss at next OV. She missed OV today. I called patient, cell phone connection was poor, she stated she was at the [place] w/her husband. I will call her back tomorrow to get her rescheduled for medicare cpx and discuss omeprazole. She will need to call her GI MD (? Dr Markham Jordan) and they will need to try to get omeprazole covered.

## 2012-01-20 NOTE — Telephone Encounter (Signed)
Spoke w/pt - she states that she called herself and it was able to be filled. She will call office if she needs anything. Pt's husband was dx w/esophageal cancer. She will be tied up w/him for the next couple months during treatment. I advised her to call w/any concerns and schedule medicare wellness when things are less busy at home.

## 2012-02-11 ENCOUNTER — Telehealth: Payer: Self-pay | Admitting: Internal Medicine

## 2012-02-11 ENCOUNTER — Encounter: Payer: Self-pay | Admitting: Internal Medicine

## 2012-02-11 ENCOUNTER — Ambulatory Visit (INDEPENDENT_AMBULATORY_CARE_PROVIDER_SITE_OTHER): Payer: Medicare Other | Admitting: Internal Medicine

## 2012-02-11 VITALS — BP 128/78 | HR 75 | Temp 98.4°F | Wt 260.0 lb

## 2012-02-11 DIAGNOSIS — J40 Bronchitis, not specified as acute or chronic: Secondary | ICD-10-CM

## 2012-02-11 MED ORDER — BENZONATATE 200 MG PO CAPS: 200.0000 mg | ORAL_CAPSULE | Freq: Three times a day (TID) | ORAL | Status: AC | PRN

## 2012-02-11 MED ORDER — LEVOFLOXACIN 750 MG PO TABS: 750.0000 mg | ORAL_TABLET | Freq: Every day | ORAL | Status: AC

## 2012-02-11 NOTE — Telephone Encounter (Signed)
The pt was recently treated with 3 weeks of Levaquin and tolerated fine.  I think okay to proceed

## 2012-02-11 NOTE — Assessment & Plan Note (Signed)
Symptoms most consistent with bronchitis. Will treat with Levaquin. No wheezing or prolonged expiration noted on exam to suggest need for steroids. However, if symptoms persist, would favor adding prednisone taper. If symptoms persist, would also favor repeating chest x-ray. Patient will call with update in 48 hours. She will return to clinic in 2 weeks.

## 2012-02-11 NOTE — Telephone Encounter (Signed)
Pharm informed

## 2012-02-11 NOTE — Telephone Encounter (Signed)
Darlene Robbins is calling as there is an interaction with Levaquin and Celexa causing a prolonged QT interval. She questions if Dr. Dan Humphreys would like to change the abx.

## 2012-02-11 NOTE — Progress Notes (Signed)
Subjective:    Patient ID: Darlene Robbins, female    DOB: Sep 19, 1941, 71 y.o.   MRN: [id number]  HPI 71 year old female presents for acute visit complaining of a two-week history of sore throat, cough productive of thick purulent sputum, and shortness of breath with exertion. She denies any fever or chills. She denies any chest pain but does note some chest tightness with cough. She has been using [employer] which he had from a previous upper respiratory infection with minimal improvement in her symptoms. She notes that she was previously treated with Levaquin for a three-week period in January because of pneumonia. Her symptoms improved, then recurred approximately 2 weeks ago.  Outpatient Encounter Prescriptions as of 02/11/2012  Medication Sig Dispense Refill  . albuterol (VENTOLIN HFA) 108 (90 BASE) MCG/ACT inhaler Inhale 2 puffs into the lungs daily.        . citalopram (CELEXA) 20 MG tablet Take 1 tablet (20 mg total) by mouth daily.  90 tablet  1  . diclofenac sodium (VOLTAREN) 1 % GEL Apply 1 application topically 2 (two) times daily as needed.        . fish oil-omega-3 fatty acids 1000 MG capsule Take 2 g by mouth daily.        . furosemide (LASIX) 20 MG tablet Take 20 mg by mouth daily as needed.        Marland Kitchen levothyroxine (SYNTHROID) 75 MCG tablet Take 1 tablet (75 mcg total) by mouth daily.  30 tablet  11  . meloxicam (MOBIC) 15 MG tablet Take 1 tablet (15 mg total) by mouth daily.  90 tablet  4  . metoCLOPramide (REGLAN) 5 MG tablet Take 5 mg by mouth 2 (two) times daily as needed.        . nystatin (MYCOSTATIN) cream       . omeprazole (PRILOSEC) 40 MG capsule Take 1 capsule (40 mg total) by mouth 2 (two) times daily.  180 capsule  3  . benzonatate (TESSALON) 200 MG capsule Take 1 capsule (200 mg total) by mouth 3 (three) times daily as needed for cough.  20 capsule  0  . levofloxacin (LEVAQUIN) 750 MG tablet Take 1 tablet (750 mg total) by mouth daily.  7 tablet  0    Review of  Systems  Constitutional: Positive for fatigue. Negative for fever, chills and unexpected weight change.  HENT: Positive for congestion, rhinorrhea, postnasal drip and sinus pressure. Negative for hearing loss, ear pain, nosebleeds, sore throat, facial swelling, sneezing, mouth sores, trouble swallowing, neck pain, neck stiffness, voice change, tinnitus and ear discharge.   Eyes: Negative for pain, discharge, redness and visual disturbance.  Respiratory: Positive for cough and shortness of breath. Negative for chest tightness, wheezing and stridor.   Cardiovascular: Negative for chest pain, palpitations and leg swelling.  Musculoskeletal: Negative for myalgias and arthralgias.  Skin: Negative for color change and rash.  Neurological: Negative for dizziness, weakness, light-headedness and headaches.  Hematological: Negative for adenopathy.   BP 128/78  Pulse 75  Temp(Src) 98.4 F (36.9 C) (Oral)  Wt 260 lb (117.935 kg)  SpO2 98%     Objective:   Physical Exam  Constitutional: She is oriented to person, place, and time. She appears well-developed and well-nourished. No distress.  HENT:  Head: Normocephalic and atraumatic.  Right Ear: External ear normal.  Left Ear: External ear normal.  Nose: Nose normal.  Mouth/Throat: Oropharynx is clear and moist. No oropharyngeal exudate.  Eyes: Conjunctivae are normal. Pupils are  equal, round, and reactive to light. Right eye exhibits no discharge. Left eye exhibits no discharge. No scleral icterus.  Neck: Normal range of motion. Neck supple. No tracheal deviation present. No thyromegaly present.  Cardiovascular: Normal rate, regular rhythm, normal heart sounds and intact distal pulses.  Exam reveals no gallop and no friction rub.   No murmur heard. Pulmonary/Chest: Effort normal. No accessory muscle usage. Not tachypneic. No respiratory distress. She has no decreased breath sounds. She has no wheezes. She has rhonchi (scattered). She has no rales.  She exhibits no tenderness.  Musculoskeletal: Normal range of motion. She exhibits no edema and no tenderness.  Lymphadenopathy:    She has no cervical adenopathy.  Neurological: She is alert and oriented to person, place, and time. No cranial nerve deficit. She exhibits normal muscle tone. Coordination normal.  Skin: Skin is warm and dry. No rash noted. She is not diaphoretic. No erythema. No pallor.  Psychiatric: She has a normal mood and affect. Her behavior is normal. Judgment and thought content normal.          Assessment & Plan:

## 2012-02-26 ENCOUNTER — Ambulatory Visit (INDEPENDENT_AMBULATORY_CARE_PROVIDER_SITE_OTHER): Payer: Medicare Other | Admitting: Internal Medicine

## 2012-02-26 ENCOUNTER — Encounter: Payer: Self-pay | Admitting: Internal Medicine

## 2012-02-26 VITALS — BP 124/74 | HR 61 | Temp 98.4°F | Wt 263.5 lb

## 2012-02-26 DIAGNOSIS — J40 Bronchitis, not specified as acute or chronic: Secondary | ICD-10-CM | POA: Diagnosis not present

## 2012-02-26 DIAGNOSIS — Z1239 Encounter for other screening for malignant neoplasm of breast: Secondary | ICD-10-CM | POA: Diagnosis not present

## 2012-02-26 NOTE — Progress Notes (Signed)
Subjective:    Patient ID: Darlene Robbins, female    DOB: September 26, 1941, 71 y.o.   MRN: [id number]  HPI 71 year old female with recent history of bronchitis presents for followup. She reports symptoms are markedly improved after completing antibiotics. She continues to have some hoarseness of her voice and occasional dry cough. She also continues to have fatigue, but notes that she has been caring for her husband who is undergoing radiation treatment for cancer. She denies any recurrent fever or chills. She denies any shortness of breath or chest pain.  Outpatient Encounter Prescriptions as of 02/26/2012  Medication Sig Dispense Refill  . albuterol (VENTOLIN HFA) 108 (90 BASE) MCG/ACT inhaler Inhale 2 puffs into the lungs daily.        . citalopram (CELEXA) 20 MG tablet Take 1 tablet (20 mg total) by mouth daily.  90 tablet  1  . diclofenac sodium (VOLTAREN) 1 % GEL Apply 1 application topically 2 (two) times daily as needed.        . fish oil-omega-3 fatty acids 1000 MG capsule Take 2 g by mouth daily.        . furosemide (LASIX) 20 MG tablet Take 20 mg by mouth daily as needed.        Marland Kitchen levothyroxine (SYNTHROID) 75 MCG tablet Take 1 tablet (75 mcg total) by mouth daily.  30 tablet  11  . meloxicam (MOBIC) 15 MG tablet Take 1 tablet (15 mg total) by mouth daily.  90 tablet  4  . metoCLOPramide (REGLAN) 5 MG tablet Take 5 mg by mouth 2 (two) times daily as needed.        . nystatin (MYCOSTATIN) cream       . omeprazole (PRILOSEC) 40 MG capsule Take 1 capsule (40 mg total) by mouth 2 (two) times daily.  180 capsule  3    Review of Systems  Constitutional: Positive for fatigue. Negative for fever, chills and unexpected weight change.  HENT: Positive for voice change. Negative for hearing loss, ear pain, nosebleeds, congestion, sore throat, facial swelling, rhinorrhea, sneezing, mouth sores, trouble swallowing, neck pain, neck stiffness, postnasal drip, sinus pressure, tinnitus and ear discharge.     Eyes: Negative for pain, discharge, redness and visual disturbance.  Respiratory: Positive for cough. Negative for chest tightness, shortness of breath, wheezing and stridor.   Cardiovascular: Negative for chest pain, palpitations and leg swelling.  Musculoskeletal: Negative for myalgias and arthralgias.  Skin: Negative for color change and rash.  Neurological: Negative for dizziness, weakness, light-headedness and headaches.  Hematological: Negative for adenopathy.   BP 124/74  Pulse 61  Temp(Src) 98.4 F (36.9 C) (Oral)  Wt 263 lb 8 oz (119.523 kg)  SpO2 98%     Objective:   Physical Exam  Constitutional: She is oriented to person, place, and time. She appears well-developed and well-nourished. No distress.  HENT:  Head: Normocephalic and atraumatic.  Right Ear: External ear normal. A middle ear effusion is present.  Left Ear: External ear normal. A middle ear effusion is present.  Nose: Nose normal.  Mouth/Throat: Oropharynx is clear and moist. No oropharyngeal exudate.  Eyes: Conjunctivae are normal. Pupils are equal, round, and reactive to light. Right eye exhibits no discharge. Left eye exhibits no discharge. No scleral icterus.  Neck: Normal range of motion. Neck supple. No tracheal deviation present. No thyromegaly present.  Cardiovascular: Normal rate, regular rhythm, normal heart sounds and intact distal pulses.  Exam reveals no gallop and no friction rub.  No murmur heard. Pulmonary/Chest: Effort normal and breath sounds normal. No respiratory distress. She has no wheezes. She has no rales. She exhibits no tenderness.  Musculoskeletal: Normal range of motion. She exhibits no edema and no tenderness.  Lymphadenopathy:    She has no cervical adenopathy.  Neurological: She is alert and oriented to person, place, and time. No cranial nerve deficit. She exhibits normal muscle tone. Coordination normal.  Skin: Skin is warm and dry. No rash noted. She is not diaphoretic. No  erythema. No pallor.  Psychiatric: She has a normal mood and affect. Her behavior is normal. Judgment and thought content normal.          Assessment & Plan:

## 2012-02-26 NOTE — Assessment & Plan Note (Signed)
Symptoms improved with antibiotics. Will continue to monitor. Pt will call if symptoms worsening or other concerns.

## 2012-03-16 LAB — HM DIABETES EYE EXAM

## 2012-03-23 ENCOUNTER — Other Ambulatory Visit: Payer: Self-pay | Admitting: *Deleted

## 2012-03-23 DIAGNOSIS — K219 Gastro-esophageal reflux disease without esophagitis: Secondary | ICD-10-CM

## 2012-03-23 MED ORDER — OMEPRAZOLE 40 MG PO CPDR: 40.0000 mg | DELAYED_RELEASE_CAPSULE | Freq: Two times a day (BID) | ORAL | Status: DC

## 2012-03-23 NOTE — Telephone Encounter (Signed)
Done

## 2012-03-25 ENCOUNTER — Other Ambulatory Visit: Payer: Self-pay | Admitting: *Deleted

## 2012-03-25 NOTE — Telephone Encounter (Signed)
[employer] denied prior authorization for Omeprazole; they will not approve medication without proof of a completed EGD. LMOM with contact name & number with this information and instructed patient to contact GI physician at [facility] office for this needed information/proceudure.Clent Jacks

## 2012-04-07 DIAGNOSIS — H2510 Age-related nuclear cataract, unspecified eye: Secondary | ICD-10-CM | POA: Diagnosis not present

## 2012-04-26 DIAGNOSIS — R351 Nocturia: Secondary | ICD-10-CM | POA: Diagnosis not present

## 2012-04-26 DIAGNOSIS — N3941 Urge incontinence: Secondary | ICD-10-CM | POA: Diagnosis not present

## 2012-04-26 DIAGNOSIS — R350 Frequency of micturition: Secondary | ICD-10-CM | POA: Diagnosis not present

## 2012-05-18 ENCOUNTER — Ambulatory Visit: Payer: Self-pay | Admitting: Internal Medicine

## 2012-05-18 DIAGNOSIS — R1013 Epigastric pain: Secondary | ICD-10-CM | POA: Diagnosis not present

## 2012-05-18 DIAGNOSIS — G4730 Sleep apnea, unspecified: Secondary | ICD-10-CM | POA: Diagnosis not present

## 2012-05-18 DIAGNOSIS — Z1231 Encounter for screening mammogram for malignant neoplasm of breast: Secondary | ICD-10-CM | POA: Diagnosis not present

## 2012-05-18 DIAGNOSIS — K3189 Other diseases of stomach and duodenum: Secondary | ICD-10-CM | POA: Diagnosis not present

## 2012-05-18 DIAGNOSIS — R609 Edema, unspecified: Secondary | ICD-10-CM | POA: Diagnosis not present

## 2012-05-18 DIAGNOSIS — I872 Venous insufficiency (chronic) (peripheral): Secondary | ICD-10-CM | POA: Diagnosis not present

## 2012-05-21 ENCOUNTER — Ambulatory Visit: Payer: Self-pay | Admitting: Internal Medicine

## 2012-05-21 DIAGNOSIS — M25579 Pain in unspecified ankle and joints of unspecified foot: Secondary | ICD-10-CM | POA: Diagnosis not present

## 2012-05-21 DIAGNOSIS — M7989 Other specified soft tissue disorders: Secondary | ICD-10-CM | POA: Diagnosis not present

## 2012-05-21 DIAGNOSIS — M25569 Pain in unspecified knee: Secondary | ICD-10-CM | POA: Diagnosis not present

## 2012-05-21 DIAGNOSIS — M79609 Pain in unspecified limb: Secondary | ICD-10-CM | POA: Diagnosis not present

## 2012-05-21 LAB — SYNOVIAL CELL COUNT + DIFF, W/ CRYSTALS
Crystals, Joint Fluid: NONE SEEN
Eosinophil: 0 %
Neutrophils: 80 %
Nucleated Cell Count: 173 /mm3
Other Cells BF: 0 %
Other Mononuclear Cells: 4 %

## 2012-06-14 ENCOUNTER — Encounter: Payer: Self-pay | Admitting: Internal Medicine

## 2012-06-22 ENCOUNTER — Other Ambulatory Visit: Payer: Self-pay | Admitting: *Deleted

## 2012-06-22 DIAGNOSIS — F419 Anxiety disorder, unspecified: Secondary | ICD-10-CM

## 2012-06-22 MED ORDER — CITALOPRAM HYDROBROMIDE 20 MG PO TABS: 20.0000 mg | ORAL_TABLET | Freq: Every day | ORAL | Status: DC

## 2012-07-15 DIAGNOSIS — K219 Gastro-esophageal reflux disease without esophagitis: Secondary | ICD-10-CM | POA: Diagnosis not present

## 2012-07-21 DIAGNOSIS — R490 Dysphonia: Secondary | ICD-10-CM | POA: Diagnosis not present

## 2012-07-29 ENCOUNTER — Telehealth: Payer: Self-pay | Admitting: Internal Medicine

## 2012-07-29 NOTE — Telephone Encounter (Signed)
Caller: [place]/Patient; Patient Name: Darlene Robbins; PCP: Ronna Polio (Adults only); Best Callback Phone Number: [phone]; Reason for call: Hypertension and intermittent aching headaches.  Onset: 07/22/12   BP 165/93 L arm at 0820; 165/109 at 0700. Urologist recently started her on Oxybutyn for stress incontinence. Advised to see MD within 24 hours for sudden elevation of BP and had recently started taking prescription medication per Hypertension Diagnosed guideline. Appointment scheduled for 07/30/12 at 1345 with Dr Dan Humphreys.

## 2012-07-29 NOTE — Telephone Encounter (Signed)
In the interim until she is seen, can you please ask her to STOP oxybutynin.

## 2012-07-29 NOTE — Telephone Encounter (Signed)
Patient advised as instructed via telephone. 

## 2012-07-30 ENCOUNTER — Encounter: Payer: Self-pay | Admitting: Internal Medicine

## 2012-07-30 ENCOUNTER — Ambulatory Visit (INDEPENDENT_AMBULATORY_CARE_PROVIDER_SITE_OTHER): Payer: Medicare Other | Admitting: Internal Medicine

## 2012-07-30 VITALS — BP 142/82 | HR 63 | Temp 98.0°F | Ht 68.0 in | Wt 270.0 lb

## 2012-07-30 DIAGNOSIS — I10 Essential (primary) hypertension: Secondary | ICD-10-CM | POA: Diagnosis not present

## 2012-07-30 DIAGNOSIS — E039 Hypothyroidism, unspecified: Secondary | ICD-10-CM

## 2012-07-30 DIAGNOSIS — Z634 Disappearance and death of family member: Secondary | ICD-10-CM | POA: Insufficient documentation

## 2012-07-30 DIAGNOSIS — Z23 Encounter for immunization: Secondary | ICD-10-CM

## 2012-07-30 HISTORY — DX: Essential (primary) hypertension: I10

## 2012-07-30 MED ORDER — SYNTHROID 75 MCG PO TABS: 75.0000 ug | ORAL_TABLET | Freq: Every day | ORAL | Status: DC

## 2012-07-30 MED ORDER — AMLODIPINE BESYLATE 5 MG PO TABS: 5.0000 mg | ORAL_TABLET | Freq: Every day | ORAL | Status: DC

## 2012-07-30 NOTE — Progress Notes (Signed)
Subjective:    Patient ID: Darlene Robbins, female    DOB: [birth year], 71 y.o.   MRN: [id number]  HPI 71 year old female presents for acute visit complaining of recent elevated blood pressures. At home, she has been checking her blood pressure because of frequent headaches. She reports that blood pressure has been as high as 180/120. She denies any chest pain or palpitations. She reports significant increase in stress recently with the sudden passing of her husband. Her husband died one month ago from complications due to esophageal cancer. She reports good support from family and friends. She has not yet sought counseling. She is not interested in trying medications to help with depressed mood or anxiety.  Outpatient Encounter Prescriptions as of 07/30/2012  Medication Sig Dispense Refill  . albuterol (VENTOLIN HFA) 108 (90 BASE) MCG/ACT inhaler Inhale 2 puffs into the lungs daily.        . citalopram (CELEXA) 20 MG tablet Take 1 tablet (20 mg total) by mouth daily.  90 tablet  1  . diclofenac sodium (VOLTAREN) 1 % GEL Apply 1 application topically 2 (two) times daily as needed.        . fish oil-omega-3 fatty acids 1000 MG capsule Take 2 g by mouth daily.        . furosemide (LASIX) 20 MG tablet Take 20 mg by mouth daily as needed.        . meloxicam (MOBIC) 15 MG tablet Take 1 tablet (15 mg total) by mouth daily.  90 tablet  4  . metoCLOPramide (REGLAN) 5 MG tablet Take 5 mg by mouth 2 (two) times daily as needed.        . Multiple Vitamin (MULTIVITAMIN) tablet Take 1 tablet by mouth daily.      Marland Kitchen nystatin (MYCOSTATIN) cream       . omeprazole (PRILOSEC) 40 MG capsule Take 1 capsule (40 mg total) by mouth 2 (two) times daily.  180 capsule  1  . oxybutynin (DITROPAN-XL) 5 MG 24 hr tablet Take 5 mg by mouth daily.      Marland Kitchen SYNTHROID 75 MCG tablet Take 1 tablet (75 mcg total) by mouth daily.  30 tablet  11  . DISCONTD: levothyroxine (SYNTHROID) 75 MCG tablet Take 1 tablet (75 mcg total) by mouth  daily.  30 tablet  11  . amLODipine (NORVASC) 5 MG tablet Take 1 tablet (5 mg total) by mouth daily.  30 tablet  3   BP 142/82  Pulse 63  Temp 98 F (36.7 C) (Oral)  Ht 5\' 8"  (1.727 m)  Wt 270 lb (122.471 kg)  BMI 41.05 kg/m2  SpO2 98%  Review of Systems  Constitutional: Negative for fever, chills, appetite change, fatigue and unexpected weight change.  HENT: Negative for ear pain, congestion, sore throat, trouble swallowing, neck pain, voice change and sinus pressure.   Eyes: Negative for visual disturbance.  Respiratory: Negative for cough, shortness of breath, wheezing and stridor.   Cardiovascular: Negative for chest pain, palpitations and leg swelling.  Gastrointestinal: Negative for nausea, vomiting, abdominal pain, diarrhea, constipation, blood in stool, abdominal distention and anal bleeding.  Genitourinary: Negative for dysuria and flank pain.  Musculoskeletal: Negative for myalgias, arthralgias and gait problem.  Skin: Negative for color change and rash.  Neurological: Negative for dizziness and headaches.  Hematological: Negative for adenopathy. Does not bruise/bleed easily.  Psychiatric/Behavioral: Positive for dysphoric mood. Negative for suicidal ideas and disturbed wake/sleep cycle. The patient is nervous/anxious.  Objective:   Physical Exam  Constitutional: She is oriented to person, place, and time. She appears well-developed and well-nourished. No distress.  HENT:  Head: Normocephalic and atraumatic.  Right Ear: External ear normal.  Left Ear: External ear normal.  Nose: Nose normal.  Mouth/Throat: Oropharynx is clear and moist. No oropharyngeal exudate.  Eyes: Conjunctivae normal are normal. Pupils are equal, round, and reactive to light. Right eye exhibits no discharge. Left eye exhibits no discharge. No scleral icterus.  Neck: Normal range of motion. Neck supple. No tracheal deviation present. No thyromegaly present.  Cardiovascular: Normal rate,  regular rhythm, normal heart sounds and intact distal pulses.  Exam reveals no gallop and no friction rub.   No murmur heard. Pulmonary/Chest: Effort normal and breath sounds normal. No respiratory distress. She has no wheezes. She has no rales. She exhibits no tenderness.  Musculoskeletal: Normal range of motion. She exhibits no edema and no tenderness.  Lymphadenopathy:    She has no cervical adenopathy.  Neurological: She is alert and oriented to person, place, and time. No cranial nerve deficit. She exhibits normal muscle tone. Coordination normal.  Skin: Skin is warm and dry. No rash noted. She is not diaphoretic. No erythema. No pallor.  Psychiatric: Her speech is normal and behavior is normal. Judgment and thought content normal. Cognition and memory are normal. She exhibits a depressed mood.          Assessment & Plan:

## 2012-07-30 NOTE — Assessment & Plan Note (Signed)
Patient tearful today describing recent death of her husband. Offered support. Discussed potentially starting medication or counseling in the future. She would like to hold off on this for now. Followup in one month.

## 2012-07-30 NOTE — Assessment & Plan Note (Signed)
Blood pressure recently elevated. Likely secondary in part to increased stress at home. Will start amlodipine 5 mg daily. Patient will monitor blood pressure at home. She will call if any blood pressures greater than 140/90 or any problems with medication. Followup in one month.

## 2012-08-04 DIAGNOSIS — N200 Calculus of kidney: Secondary | ICD-10-CM | POA: Insufficient documentation

## 2012-08-04 DIAGNOSIS — N3941 Urge incontinence: Secondary | ICD-10-CM | POA: Insufficient documentation

## 2012-08-26 DIAGNOSIS — R490 Dysphonia: Secondary | ICD-10-CM | POA: Diagnosis not present

## 2012-09-06 ENCOUNTER — Encounter: Payer: Self-pay | Admitting: Internal Medicine

## 2012-09-06 ENCOUNTER — Other Ambulatory Visit: Payer: Self-pay | Admitting: Internal Medicine

## 2012-09-06 ENCOUNTER — Ambulatory Visit (INDEPENDENT_AMBULATORY_CARE_PROVIDER_SITE_OTHER): Payer: Medicare Other | Admitting: Internal Medicine

## 2012-09-06 VITALS — BP 120/64 | HR 60 | Temp 98.2°F | Ht 68.0 in | Wt 269.8 lb

## 2012-09-06 DIAGNOSIS — E785 Hyperlipidemia, unspecified: Secondary | ICD-10-CM | POA: Diagnosis not present

## 2012-09-06 DIAGNOSIS — I10 Essential (primary) hypertension: Secondary | ICD-10-CM | POA: Diagnosis not present

## 2012-09-06 DIAGNOSIS — E669 Obesity, unspecified: Secondary | ICD-10-CM

## 2012-09-06 DIAGNOSIS — E039 Hypothyroidism, unspecified: Secondary | ICD-10-CM

## 2012-09-06 DIAGNOSIS — D649 Anemia, unspecified: Secondary | ICD-10-CM | POA: Diagnosis not present

## 2012-09-06 LAB — MICROALBUMIN / CREATININE URINE RATIO: Microalb Creat Ratio: 1.3 mg/g (ref 0.0–30.0)

## 2012-09-06 LAB — COMPREHENSIVE METABOLIC PANEL
ALT: 20 U/L (ref 0–35)
AST: 22 U/L (ref 0–37)
CO2: 27 mEq/L (ref 19–32)
Chloride: 104 mEq/L (ref 96–112)
Creatinine, Ser: 0.7 mg/dL (ref 0.4–1.2)
GFR: 89.17 mL/min (ref >60.00)
Sodium: 139 mEq/L (ref 135–145)
Total Bilirubin: 0.6 mg/dL (ref 0.3–1.2)
Total Protein: 6.4 g/dL (ref 6.0–8.3)

## 2012-09-06 LAB — LIPID PANEL
Cholesterol: 158 mg/dL (ref 0–200)
HDL: 37.9 mg/dL — ABNORMAL LOW (ref >39.00)
LDL Cholesterol: 96 mg/dL (ref 0–99)
VLDL: 24.6 mg/dL (ref 0.0–40.0)

## 2012-09-06 MED ORDER — FUROSEMIDE 20 MG PO TABS: 20.0000 mg | ORAL_TABLET | Freq: Every day | ORAL | Status: AC | PRN

## 2012-09-06 MED ORDER — SYNTHROID 75 MCG PO TABS: 75.0000 ug | ORAL_TABLET | Freq: Every day | ORAL | Status: DC

## 2012-09-06 NOTE — Assessment & Plan Note (Signed)
TSH normal today. We'll continue Synthroid at current dose. Followup in 6 months or sooner as needed.

## 2012-09-06 NOTE — Progress Notes (Signed)
Subjective:    Patient ID: Darlene Robbins, female    DOB: [birth year], 71 y.o.   MRN: [id number]  HPI 71 year old female with history of hypertension, depression, and hypothyroidism presents for followup. She reports she is generally doing well. She reports full compliance with her medications. She notes that she recently took a vacation to the beach for 2 weeks which she found very relaxing. She is also planning to take a trip in the upcoming weeks to visit a relative in [place]. She denies any new concerns today.  Outpatient Encounter Prescriptions as of 09/06/2012  Medication Sig Dispense Refill  . albuterol (VENTOLIN HFA) 108 (90 BASE) MCG/ACT inhaler Inhale 2 puffs into the lungs daily.        Marland Kitchen amLODipine (NORVASC) 5 MG tablet Take 1 tablet (5 mg total) by mouth daily.  30 tablet  3  . citalopram (CELEXA) 20 MG tablet Take 1 tablet (20 mg total) by mouth daily.  90 tablet  1  . diclofenac sodium (VOLTAREN) 1 % GEL Apply 1 application topically 2 (two) times daily as needed.        . fish oil-omega-3 fatty acids 1000 MG capsule Take 2 g by mouth daily.        . furosemide (LASIX) 20 MG tablet Take 1 tablet (20 mg total) by mouth daily as needed.  90 tablet  4  . meloxicam (MOBIC) 15 MG tablet Take 1 tablet (15 mg total) by mouth daily.  90 tablet  4  . metoCLOPramide (REGLAN) 5 MG tablet Take 5 mg by mouth 2 (two) times daily as needed.        . Multiple Vitamin (MULTIVITAMIN) tablet Take 1 tablet by mouth daily.      Marland Kitchen nystatin (MYCOSTATIN) cream       . omeprazole (PRILOSEC) 40 MG capsule Take 1 capsule (40 mg total) by mouth 2 (two) times daily.  180 capsule  1  . oxybutynin (DITROPAN-XL) 5 MG 24 hr tablet Take 5 mg by mouth daily.      Marland Kitchen SYNTHROID 75 MCG tablet Take 1 tablet (75 mcg total) by mouth daily.  90 tablet  4  . DISCONTD: furosemide (LASIX) 20 MG tablet Take 20 mg by mouth daily as needed.        Marland Kitchen DISCONTD: SYNTHROID 75 MCG tablet Take 1 tablet (75 mcg total) by mouth daily.   30 tablet  11   BP 120/64  Pulse 60  Temp 98.2 F (36.8 C) (Oral)  Ht 5\' 8"  (1.727 m)  Wt 269 lb 12 oz (122.358 kg)  BMI 41.02 kg/m2  SpO2 99%  Review of Systems  Constitutional: Negative for fever, chills, appetite change, fatigue and unexpected weight change.  HENT: Negative for ear pain, congestion, sore throat, trouble swallowing, neck pain, voice change and sinus pressure.   Eyes: Negative for visual disturbance.  Respiratory: Negative for cough, shortness of breath, wheezing and stridor.   Cardiovascular: Negative for chest pain, palpitations and leg swelling.  Gastrointestinal: Negative for nausea, vomiting, abdominal pain, diarrhea, constipation, blood in stool, abdominal distention and anal bleeding.  Genitourinary: Negative for dysuria and flank pain.  Musculoskeletal: Negative for myalgias, arthralgias and gait problem.  Skin: Negative for color change and rash.  Neurological: Negative for dizziness and headaches.  Hematological: Negative for adenopathy. Does not bruise/bleed easily.  Psychiatric/Behavioral: Negative for suicidal ideas, disturbed wake/sleep cycle and dysphoric mood. The patient is not nervous/anxious.        Objective:  Physical Exam  Constitutional: She is oriented to person, place, and time. She appears well-developed and well-nourished. No distress.  HENT:  Head: Normocephalic and atraumatic.  Right Ear: External ear normal.  Left Ear: External ear normal.  Nose: Nose normal.  Mouth/Throat: Oropharynx is clear and moist. No oropharyngeal exudate.  Eyes: Conjunctivae normal are normal. Pupils are equal, round, and reactive to light. Right eye exhibits no discharge. Left eye exhibits no discharge. No scleral icterus.  Neck: Normal range of motion. Neck supple. No tracheal deviation present. No thyromegaly present.  Cardiovascular: Normal rate, regular rhythm, normal heart sounds and intact distal pulses.  Exam reveals no gallop and no friction rub.    No murmur heard. Pulmonary/Chest: Effort normal and breath sounds normal. No respiratory distress. She has no wheezes. She has no rales. She exhibits no tenderness.  Musculoskeletal: Normal range of motion. She exhibits no edema and no tenderness.  Lymphadenopathy:    She has no cervical adenopathy.  Neurological: She is alert and oriented to person, place, and time. No cranial nerve deficit. She exhibits normal muscle tone. Coordination normal.  Skin: Skin is warm and dry. No rash noted. She is not diaphoretic. No erythema. No pallor.  Psychiatric: She has a normal mood and affect. Her behavior is normal. Judgment and thought content normal.          Assessment & Plan:

## 2012-09-06 NOTE — Assessment & Plan Note (Signed)
Weight is unchanged. Encouraged healthy diet and regular physical activity.

## 2012-09-06 NOTE — Assessment & Plan Note (Addendum)
Blood pressure well-controlled with current medications. Renal function normal with labs today. Followup in 6 months or sooner as needed.

## 2012-09-07 ENCOUNTER — Encounter: Payer: Self-pay | Admitting: *Deleted

## 2012-09-09 LAB — CBC
HCT: 40.4 % (ref 34.0–46.6)
MCH: 31 pg (ref 26.6–33.0)
MCHC: 33.4 g/dL (ref 31.5–35.7)
MCV: 93 fL (ref 79–97)
Platelets: 199 10*3/uL (ref 155–379)
RDW: 13 % (ref 12.3–15.4)

## 2012-09-14 ENCOUNTER — Encounter: Payer: Self-pay | Admitting: *Deleted

## 2012-09-14 NOTE — Progress Notes (Signed)
Result letter mailed to patient's home address.

## 2012-09-28 ENCOUNTER — Encounter: Payer: Self-pay | Admitting: Internal Medicine

## 2012-10-19 ENCOUNTER — Other Ambulatory Visit: Payer: Self-pay | Admitting: General Practice

## 2012-10-19 DIAGNOSIS — M1990 Unspecified osteoarthritis, unspecified site: Secondary | ICD-10-CM

## 2012-10-19 MED ORDER — MELOXICAM 15 MG PO TABS: 15.0000 mg | ORAL_TABLET | Freq: Every day | ORAL | Status: DC

## 2012-10-19 NOTE — Telephone Encounter (Signed)
Med sent to [facility] for Pt.

## 2012-12-06 DIAGNOSIS — R05 Cough: Secondary | ICD-10-CM | POA: Diagnosis not present

## 2012-12-06 DIAGNOSIS — G4730 Sleep apnea, unspecified: Secondary | ICD-10-CM | POA: Diagnosis not present

## 2012-12-06 DIAGNOSIS — R609 Edema, unspecified: Secondary | ICD-10-CM | POA: Diagnosis not present

## 2012-12-06 DIAGNOSIS — I059 Rheumatic mitral valve disease, unspecified: Secondary | ICD-10-CM | POA: Diagnosis not present

## 2012-12-09 ENCOUNTER — Other Ambulatory Visit: Payer: Self-pay | Admitting: *Deleted

## 2012-12-09 DIAGNOSIS — I10 Essential (primary) hypertension: Secondary | ICD-10-CM

## 2012-12-10 MED ORDER — AMLODIPINE BESYLATE 5 MG PO TABS: 5.0000 mg | ORAL_TABLET | Freq: Every day | ORAL | Status: DC

## 2013-02-09 ENCOUNTER — Other Ambulatory Visit: Payer: Self-pay | Admitting: *Deleted

## 2013-02-09 DIAGNOSIS — F419 Anxiety disorder, unspecified: Secondary | ICD-10-CM

## 2013-02-10 MED ORDER — CITALOPRAM HYDROBROMIDE 20 MG PO TABS: 20.0000 mg | ORAL_TABLET | Freq: Every day | ORAL | Status: DC

## 2013-02-10 NOTE — Telephone Encounter (Signed)
Eprescribed.

## 2013-02-11 ENCOUNTER — Telehealth: Payer: Self-pay | Admitting: *Deleted

## 2013-02-11 DIAGNOSIS — E039 Hypothyroidism, unspecified: Secondary | ICD-10-CM

## 2013-02-11 DIAGNOSIS — K219 Gastro-esophageal reflux disease without esophagitis: Secondary | ICD-10-CM

## 2013-02-11 MED ORDER — OMEPRAZOLE 40 MG PO CPDR: 40.0000 mg | DELAYED_RELEASE_CAPSULE | Freq: Two times a day (BID) | ORAL | Status: DC

## 2013-02-11 MED ORDER — LEVOTHYROXINE SODIUM 75 MCG PO TABS: 75.0000 ug | ORAL_TABLET | Freq: Every day | ORAL | Status: DC

## 2013-02-11 NOTE — Telephone Encounter (Signed)
Patient called requesting 90 day supply refills for Synthroid (generic version) and Prilosec sent to [facility].

## 2013-02-11 NOTE — Telephone Encounter (Signed)
Prescriptions has been sent to [facility]

## 2013-03-07 ENCOUNTER — Encounter: Payer: Medicare Other | Admitting: Internal Medicine

## 2013-03-07 DIAGNOSIS — R05 Cough: Secondary | ICD-10-CM | POA: Diagnosis not present

## 2013-03-07 DIAGNOSIS — J301 Allergic rhinitis due to pollen: Secondary | ICD-10-CM | POA: Diagnosis not present

## 2013-03-11 DIAGNOSIS — Z881 Allergy status to other antibiotic agents status: Secondary | ICD-10-CM | POA: Diagnosis not present

## 2013-03-11 DIAGNOSIS — Z885 Allergy status to narcotic agent status: Secondary | ICD-10-CM | POA: Diagnosis not present

## 2013-03-11 DIAGNOSIS — G4733 Obstructive sleep apnea (adult) (pediatric): Secondary | ICD-10-CM | POA: Diagnosis not present

## 2013-03-11 DIAGNOSIS — E669 Obesity, unspecified: Secondary | ICD-10-CM | POA: Insufficient documentation

## 2013-03-11 DIAGNOSIS — K219 Gastro-esophageal reflux disease without esophagitis: Secondary | ICD-10-CM | POA: Diagnosis not present

## 2013-03-11 DIAGNOSIS — I509 Heart failure, unspecified: Secondary | ICD-10-CM | POA: Diagnosis not present

## 2013-03-11 DIAGNOSIS — R0609 Other forms of dyspnea: Secondary | ICD-10-CM | POA: Diagnosis not present

## 2013-03-11 DIAGNOSIS — R0602 Shortness of breath: Secondary | ICD-10-CM | POA: Diagnosis not present

## 2013-03-11 DIAGNOSIS — M47814 Spondylosis without myelopathy or radiculopathy, thoracic region: Secondary | ICD-10-CM | POA: Diagnosis not present

## 2013-03-11 DIAGNOSIS — Z880 Allergy status to penicillin: Secondary | ICD-10-CM | POA: Diagnosis not present

## 2013-03-11 DIAGNOSIS — Z87442 Personal history of urinary calculi: Secondary | ICD-10-CM | POA: Diagnosis not present

## 2013-03-11 DIAGNOSIS — M129 Arthropathy, unspecified: Secondary | ICD-10-CM | POA: Diagnosis not present

## 2013-03-11 DIAGNOSIS — Z79899 Other long term (current) drug therapy: Secondary | ICD-10-CM | POA: Diagnosis not present

## 2013-03-12 DIAGNOSIS — R0602 Shortness of breath: Secondary | ICD-10-CM | POA: Diagnosis not present

## 2013-03-12 DIAGNOSIS — G4733 Obstructive sleep apnea (adult) (pediatric): Secondary | ICD-10-CM | POA: Diagnosis not present

## 2013-03-14 ENCOUNTER — Telehealth: Payer: Self-pay | Admitting: Internal Medicine

## 2013-03-14 NOTE — Telephone Encounter (Signed)
Patient was seen in the ER Friday May 2,2014 for shortness of breath in [place]. They instructed her to follow up with her primary physician this week Dr. Dan Humphreys does not have any available appointments. Please advise.

## 2013-03-14 NOTE — Telephone Encounter (Signed)
Patient scheduled for Monday to see Dr. Dan Humphreys. Offered her an appointment with Raquel this week but she declined. Appointment for tomorrow was already taken.

## 2013-03-14 NOTE — Telephone Encounter (Signed)
You me to put her in tomorrow at 11:15

## 2013-03-21 ENCOUNTER — Ambulatory Visit (INDEPENDENT_AMBULATORY_CARE_PROVIDER_SITE_OTHER): Payer: Medicare Other | Admitting: Internal Medicine

## 2013-03-21 ENCOUNTER — Encounter: Payer: Self-pay | Admitting: Internal Medicine

## 2013-03-21 VITALS — BP 124/70 | HR 66 | Temp 98.3°F | Wt 275.0 lb

## 2013-03-21 DIAGNOSIS — F411 Generalized anxiety disorder: Secondary | ICD-10-CM

## 2013-03-21 DIAGNOSIS — F419 Anxiety disorder, unspecified: Secondary | ICD-10-CM

## 2013-03-21 DIAGNOSIS — R0609 Other forms of dyspnea: Secondary | ICD-10-CM | POA: Diagnosis not present

## 2013-03-21 DIAGNOSIS — K219 Gastro-esophageal reflux disease without esophagitis: Secondary | ICD-10-CM

## 2013-03-21 DIAGNOSIS — R0600 Dyspnea, unspecified: Secondary | ICD-10-CM

## 2013-03-21 DIAGNOSIS — E039 Hypothyroidism, unspecified: Secondary | ICD-10-CM

## 2013-03-21 DIAGNOSIS — E669 Obesity, unspecified: Secondary | ICD-10-CM

## 2013-03-21 DIAGNOSIS — I10 Essential (primary) hypertension: Secondary | ICD-10-CM | POA: Diagnosis not present

## 2013-03-21 DIAGNOSIS — J449 Chronic obstructive pulmonary disease, unspecified: Secondary | ICD-10-CM | POA: Insufficient documentation

## 2013-03-21 DIAGNOSIS — R0989 Other specified symptoms and signs involving the circulatory and respiratory systems: Secondary | ICD-10-CM

## 2013-03-21 MED ORDER — AMLODIPINE BESYLATE 5 MG PO TABS: 5.0000 mg | ORAL_TABLET | Freq: Every day | ORAL | Status: DC

## 2013-03-21 MED ORDER — OMEPRAZOLE 20 MG PO CPDR: 20.0000 mg | DELAYED_RELEASE_CAPSULE | Freq: Two times a day (BID) | ORAL | Status: DC

## 2013-03-21 MED ORDER — LEVOTHYROXINE SODIUM 75 MCG PO TABS: 75.0000 ug | ORAL_TABLET | Freq: Every day | ORAL | Status: DC

## 2013-03-21 MED ORDER — CITALOPRAM HYDROBROMIDE 20 MG PO TABS: 20.0000 mg | ORAL_TABLET | Freq: Every day | ORAL | Status: DC

## 2013-03-21 NOTE — Assessment & Plan Note (Signed)
Wt Readings from Last 3 Encounters:  03/21/13 275 lb (124.739 kg)  09/06/12 269 lb 12 oz (122.358 kg)  07/30/12 270 lb (122.471 kg)   Weight increased over last 6 months. Pt notes sedentary lifestyle after death of her husband. Encouraged increased physical activity and limiting caloric intake with goal of weight loss.

## 2013-03-21 NOTE — Assessment & Plan Note (Signed)
BP Readings from Last 3 Encounters:  03/21/13 124/70  09/06/12 120/64  07/30/12 142/82   BP well controlled on current medications. Will continue.

## 2013-03-21 NOTE — Assessment & Plan Note (Signed)
Recent episode of dyspnea, s/p evaluation in [facility] ED. Reviewed records through [facility]. BNP was elevated 1170 during evaluation suggestion CHF with hypervolemia. Suspect that use of prednisone led to fluid overload and pulmonary edema. Symptoms have now completely resolved with use of furosemide. Will set up follow up with her cardiologist. Will plan to repeat ECHO, given last ECHO 07/2011. Follow up 4 weeks and prn.

## 2013-03-21 NOTE — Progress Notes (Signed)
Subjective:    Patient ID: Darlene Robbins, female    DOB: [birth year], 72 y.o.   MRN: [id number]  HPI 72 year old female presents for hospital followup. She reports that about 2 weeks ago she developed bronchitis and was seen by her ENT physician. He placed her on a prednisone taper. She completed 4 days of the prednisone taper and then developed worsening shortness of breath. She denies chest pain or palpitations during that time. Because of the shortness of breath, she went to the [facility] emergency room where she was evaluated and found to have pulmonary edema with BNP of 1170. EKG was normal. She was given IV furosemide and monitored overnight. She had significant improvement and shortness of breath. Prednisone was stopped. She was discharged home with 4 additional days of furosemide. She denies any recurrent shortness of breath. She denies any swelling in her legs. She has a history of hypertension but no known history of congestive heart failure. Last echo was in September 2012 and showed normal systolic function.  Outpatient Encounter Prescriptions as of 03/21/2013  Medication Sig Dispense Refill  . amLODipine (NORVASC) 5 MG tablet Take 1 tablet (5 mg total) by mouth daily.  90 tablet  4  . citalopram (CELEXA) 20 MG tablet Take 1 tablet (20 mg total) by mouth daily.  90 tablet  4  . diclofenac sodium (VOLTAREN) 1 % GEL Apply 1 application topically 2 (two) times daily as needed.        . fish oil-omega-3 fatty acids 1000 MG capsule Take 2 g by mouth daily.        Marland Kitchen levothyroxine (SYNTHROID) 75 MCG tablet Take 1 tablet (75 mcg total) by mouth daily.  90 tablet  4  . meloxicam (MOBIC) 15 MG tablet Take 1 tablet (15 mg total) by mouth daily.  90 tablet  4  . Multiple Vitamin (MULTIVITAMIN) tablet Take 1 tablet by mouth daily.      Marland Kitchen nystatin (MYCOSTATIN) cream       . omeprazole (PRILOSEC) 20 MG capsule Take 1 capsule (20 mg total) by mouth 2 (two) times daily.  180 capsule  0  . oxybutynin  (DITROPAN-XL) 5 MG 24 hr tablet Take 5 mg by mouth daily.      Marland Kitchen albuterol (VENTOLIN HFA) 108 (90 BASE) MCG/ACT inhaler Inhale 2 puffs into the lungs daily.        . furosemide (LASIX) 20 MG tablet Take 1 tablet (20 mg total) by mouth daily as needed.  90 tablet  4  . metoCLOPramide (REGLAN) 5 MG tablet Take 5 mg by mouth 2 (two) times daily as needed.        No facility-administered encounter medications on file as of 03/21/2013.   BP 124/70  Pulse 66  Temp(Src) 98.3 F (36.8 C) (Oral)  Wt 275 lb (124.739 kg)  BMI 41.82 kg/m2  SpO2 97%  Review of Systems  Constitutional: Negative for fever, chills, appetite change, fatigue and unexpected weight change.  HENT: Negative for ear pain, congestion, sore throat, trouble swallowing, neck pain, voice change and sinus pressure.   Eyes: Negative for visual disturbance.  Respiratory: Negative for cough, shortness of breath, wheezing and stridor.   Cardiovascular: Negative for chest pain, palpitations and leg swelling.  Gastrointestinal: Negative for nausea, vomiting, abdominal pain, diarrhea, constipation, blood in stool, abdominal distention and anal bleeding.  Genitourinary: Negative for dysuria and flank pain.  Musculoskeletal: Negative for myalgias, arthralgias and gait problem.  Skin: Negative for color change and rash.  Neurological: Negative for dizziness and headaches.  Hematological: Negative for adenopathy. Does not bruise/bleed easily.  Psychiatric/Behavioral: Negative for suicidal ideas, sleep disturbance and dysphoric mood. The patient is not nervous/anxious.        Objective:   Physical Exam  Constitutional: She is oriented to person, place, and time. She appears well-developed and well-nourished. No distress.  HENT:  Head: Normocephalic and atraumatic.  Right Ear: External ear normal.  Left Ear: External ear normal.  Nose: Nose normal.  Mouth/Throat: Oropharynx is clear and moist. No oropharyngeal exudate.  Eyes:  Conjunctivae are normal. Pupils are equal, round, and reactive to light. Right eye exhibits no discharge. Left eye exhibits no discharge. No scleral icterus.  Neck: Normal range of motion. Neck supple. No tracheal deviation present. No thyromegaly present.  Cardiovascular: Normal rate, regular rhythm, normal heart sounds and intact distal pulses.  Exam reveals no gallop and no friction rub.   No murmur heard. Pulmonary/Chest: Effort normal and breath sounds normal. No accessory muscle usage. Not tachypneic. No respiratory distress. She has no decreased breath sounds. She has no wheezes. She has no rhonchi. She has no rales. She exhibits no tenderness.  Musculoskeletal: Normal range of motion. She exhibits no edema and no tenderness.  Lymphadenopathy:    She has no cervical adenopathy.  Neurological: She is alert and oriented to person, place, and time. No cranial nerve deficit. She exhibits normal muscle tone. Coordination normal.  Skin: Skin is warm and dry. No rash noted. She is not diaphoretic. No erythema. No pallor.  Psychiatric: She has a normal mood and affect. Her behavior is normal. Judgment and thought content normal.          Assessment & Plan:

## 2013-03-24 ENCOUNTER — Ambulatory Visit: Payer: Self-pay | Admitting: Internal Medicine

## 2013-03-24 DIAGNOSIS — I079 Rheumatic tricuspid valve disease, unspecified: Secondary | ICD-10-CM | POA: Diagnosis not present

## 2013-03-24 DIAGNOSIS — I509 Heart failure, unspecified: Secondary | ICD-10-CM | POA: Diagnosis not present

## 2013-03-24 DIAGNOSIS — I059 Rheumatic mitral valve disease, unspecified: Secondary | ICD-10-CM | POA: Diagnosis not present

## 2013-04-01 ENCOUNTER — Telehealth: Payer: Self-pay | Admitting: *Deleted

## 2013-04-01 NOTE — Telephone Encounter (Signed)
It was done at the hospital

## 2013-04-01 NOTE — Telephone Encounter (Signed)
Who performed the ECHO? I have not received results on this?

## 2013-04-01 NOTE — Telephone Encounter (Signed)
Can we request ECHO from [facility]?

## 2013-04-01 NOTE — Telephone Encounter (Signed)
Patient had an echocardiogram last Thursday 5/15 and has not heard anything about it.

## 2013-04-01 NOTE — Telephone Encounter (Signed)
Informed patient we requested this from [facility], but not likely to get results today.

## 2013-04-06 NOTE — Telephone Encounter (Signed)
Reviewed ECHO report. This showed normal heart function and normal heart valves with only mild mitral valve regurgitation.

## 2013-04-06 NOTE — Telephone Encounter (Signed)
Results in folder.

## 2013-04-06 NOTE — Telephone Encounter (Signed)
Patient verbally informed and agreed.  

## 2013-04-28 ENCOUNTER — Ambulatory Visit: Payer: Medicare Other | Admitting: Internal Medicine

## 2013-05-02 ENCOUNTER — Ambulatory Visit (INDEPENDENT_AMBULATORY_CARE_PROVIDER_SITE_OTHER): Payer: Medicare Other | Admitting: Internal Medicine

## 2013-05-02 ENCOUNTER — Encounter: Payer: Self-pay | Admitting: Internal Medicine

## 2013-05-02 VITALS — BP 128/62 | HR 60 | Temp 98.0°F | Wt 280.0 lb

## 2013-05-02 DIAGNOSIS — F4323 Adjustment disorder with mixed anxiety and depressed mood: Secondary | ICD-10-CM | POA: Diagnosis not present

## 2013-05-02 DIAGNOSIS — R0989 Other specified symptoms and signs involving the circulatory and respiratory systems: Secondary | ICD-10-CM | POA: Diagnosis not present

## 2013-05-02 DIAGNOSIS — E119 Type 2 diabetes mellitus without complications: Secondary | ICD-10-CM | POA: Diagnosis not present

## 2013-05-02 DIAGNOSIS — I10 Essential (primary) hypertension: Secondary | ICD-10-CM

## 2013-05-02 DIAGNOSIS — R0600 Dyspnea, unspecified: Secondary | ICD-10-CM

## 2013-05-02 DIAGNOSIS — R0609 Other forms of dyspnea: Secondary | ICD-10-CM

## 2013-05-02 LAB — COMPREHENSIVE METABOLIC PANEL
Albumin: 4 g/dL (ref 3.5–5.2)
BUN: 14 mg/dL (ref 6–23)
CO2: 28 mEq/L (ref 19–32)
GFR: 84.74 mL/min (ref >60.00)
Glucose, Bld: 152 mg/dL — ABNORMAL HIGH (ref 70–99)
Potassium: 4.5 mEq/L (ref 3.5–5.1)
Sodium: 139 mEq/L (ref 135–145)
Total Protein: 6.5 g/dL (ref 6.0–8.3)

## 2013-05-02 LAB — TSH: TSH: 1.62 u[IU]/mL (ref 0.35–5.50)

## 2013-05-02 LAB — VITAMIN B12: Vitamin B-12: 326 pg/mL (ref 211–911)

## 2013-05-02 MED ORDER — BUPROPION HCL ER (XL) 150 MG PO TB24: 150.0000 mg | ORAL_TABLET | Freq: Every day | ORAL | Status: DC

## 2013-05-02 NOTE — Assessment & Plan Note (Signed)
Symptoms have improved however still having significant exercise tolerance likely from deconditioning. Encouraged increased physical activity such as walking.  Once symptoms of depression better controlled, may be good candidate for [facility] rehab.

## 2013-05-02 NOTE — Patient Instructions (Addendum)
Feeling Good by Darlene Robbins - Book about improving symptoms of depression or anxiety  Start Wellbutrin 150mg  every morning.  Follow up in 4 weeks.

## 2013-05-02 NOTE — Progress Notes (Signed)
Subjective:    Patient ID: Darlene Robbins, female    DOB: 04-30-1941, 72 y.o.   MRN: [id number]  HPI 72 year old female with history of hypertension, obesity, hypothyroidism presents for followup. At her last visit, she recently been admitted at [facility] with dyspnea and found to be hypervolemic. She responded to furosemide and was ultimately discharged home on oral furosemide which she has tolerated well. She denies any recurrent symptoms of dyspnea at rest. However, she acknowledges exercise intolerance with dyspnea with activity such as walking up 1 flight of stairs. She denies any chest pain, palpitations. She has not yet followed up with her cardiologist. She did have echocardiogram which showed normal LV function.  Her primary concern today is worsening anxiety and depression. She reports that she feels overwhelmed after the death of her husband in dealing with day-to-day activities in her home. She reports lack of interest in her usual activities. She has isolated herself and has been reading books almost nonstop. She has read 200 books since March of this year.  She reports depressed mood. She has to force herself to go to church or out with family. She sleeps most of the day and does not leave her home unless she absolutely has to. No suicidal ideation.  Outpatient Encounter Prescriptions as of 05/02/2013  Medication Sig Dispense Refill  . amLODipine (NORVASC) 5 MG tablet Take 1 tablet (5 mg total) by mouth daily.  90 tablet  4  . citalopram (CELEXA) 20 MG tablet Take 1 tablet (20 mg total) by mouth daily.  90 tablet  4  . diclofenac sodium (VOLTAREN) 1 % GEL Apply 1 application topically 2 (two) times daily as needed.        . fish oil-omega-3 fatty acids 1000 MG capsule Take 2 g by mouth daily.        . furosemide (LASIX) 20 MG tablet Take 1 tablet (20 mg total) by mouth daily as needed.  90 tablet  4  . levothyroxine (SYNTHROID) 75 MCG tablet Take 1 tablet (75 mcg total) by mouth  daily.  90 tablet  4  . meloxicam (MOBIC) 15 MG tablet Take 1 tablet (15 mg total) by mouth daily.  90 tablet  4  . Multiple Vitamin (MULTIVITAMIN) tablet Take 1 tablet by mouth daily.      Marland Kitchen nystatin (MYCOSTATIN) cream       . omeprazole (PRILOSEC) 20 MG capsule Take 1 capsule (20 mg total) by mouth 2 (two) times daily.  180 capsule  0  . oxybutynin (DITROPAN-XL) 5 MG 24 hr tablet Take 5 mg by mouth daily.      Marland Kitchen albuterol (VENTOLIN HFA) 108 (90 BASE) MCG/ACT inhaler Inhale 2 puffs into the lungs daily.        . metoCLOPramide (REGLAN) 5 MG tablet Take 5 mg by mouth 2 (two) times daily as needed.        No facility-administered encounter medications on file as of 05/02/2013.   BP 128/62  Pulse 60  Temp(Src) 98 F (36.7 C) (Oral)  Wt 280 lb (127.007 kg)  BMI 42.58 kg/m2  SpO2 97%  Review of Systems  Constitutional: Negative for fever, chills, appetite change, fatigue and unexpected weight change.  HENT: Negative for ear pain, congestion, sore throat, trouble swallowing, neck pain, voice change and sinus pressure.   Eyes: Negative for visual disturbance.  Respiratory: Negative for cough, shortness of breath, wheezing and stridor.   Cardiovascular: Negative for chest pain, palpitations and leg swelling.  Gastrointestinal: Negative for nausea, vomiting, abdominal pain, diarrhea, constipation, blood in stool, abdominal distention and anal bleeding.  Genitourinary: Negative for dysuria and flank pain.  Musculoskeletal: Negative for myalgias, arthralgias and gait problem.  Skin: Negative for color change and rash.  Neurological: Negative for dizziness and headaches.  Hematological: Negative for adenopathy. Does not bruise/bleed easily.  Psychiatric/Behavioral: Positive for sleep disturbance and dysphoric mood. Negative for suicidal ideas. The patient is nervous/anxious.        Objective:   Physical Exam  Constitutional: She is oriented to person, place, and time. She appears  well-developed and well-nourished. No distress.  HENT:  Head: Normocephalic and atraumatic.  Right Ear: External ear normal.  Left Ear: External ear normal.  Nose: Nose normal.  Mouth/Throat: Oropharynx is clear and moist. No oropharyngeal exudate.  Eyes: Conjunctivae are normal. Pupils are equal, round, and reactive to light. Right eye exhibits no discharge. Left eye exhibits no discharge. No scleral icterus.  Neck: Normal range of motion. Neck supple. No tracheal deviation present. No thyromegaly present.  Cardiovascular: Normal rate, regular rhythm, normal heart sounds and intact distal pulses.  Exam reveals no gallop and no friction rub.   No murmur heard. Pulmonary/Chest: Effort normal and breath sounds normal. No accessory muscle usage. Not tachypneic. No respiratory distress. She has no decreased breath sounds. She has no wheezes. She has no rhonchi. She has no rales. She exhibits no tenderness.  Musculoskeletal: Normal range of motion. She exhibits no edema and no tenderness.  Lymphadenopathy:    She has no cervical adenopathy.  Neurological: She is alert and oriented to person, place, and time. No cranial nerve deficit. She exhibits normal muscle tone. Coordination normal.  Skin: Skin is warm and dry. No rash noted. She is not diaphoretic. No erythema. No pallor.  Psychiatric: Her speech is normal and behavior is normal. Judgment and thought content normal. Her mood appears anxious. She exhibits a depressed mood. She expresses no suicidal ideation.          Assessment & Plan:

## 2013-05-02 NOTE — Assessment & Plan Note (Signed)
Symptoms consistent with adjustment disorder with mixed anxiety and depressed mood as well as a debridement. Will start Wellbutrin in addition to Celexa. Encouraged her to read Feeling Good by Lawerance Bach. Discussed setting up counseling but she would like to hold off for now. She will call if symptoms are not improving. Followup in 4 weeks or sooner as needed.

## 2013-05-06 ENCOUNTER — Other Ambulatory Visit: Payer: Self-pay | Admitting: Internal Medicine

## 2013-05-06 DIAGNOSIS — R7309 Other abnormal glucose: Secondary | ICD-10-CM

## 2013-05-06 DIAGNOSIS — E669 Obesity, unspecified: Secondary | ICD-10-CM

## 2013-05-10 ENCOUNTER — Other Ambulatory Visit: Payer: Medicare Other

## 2013-05-11 ENCOUNTER — Other Ambulatory Visit (INDEPENDENT_AMBULATORY_CARE_PROVIDER_SITE_OTHER): Payer: Medicare Other

## 2013-05-11 DIAGNOSIS — R7309 Other abnormal glucose: Secondary | ICD-10-CM

## 2013-05-12 ENCOUNTER — Telehealth: Payer: Self-pay | Admitting: Internal Medicine

## 2013-05-12 MED ORDER — METFORMIN HCL 500 MG PO TABS: 500.0000 mg | ORAL_TABLET | Freq: Two times a day (BID) | ORAL | Status: DC

## 2013-05-12 NOTE — Telephone Encounter (Signed)
Patient was given lab results and prescription sent to pharmacy

## 2013-05-12 NOTE — Telephone Encounter (Signed)
Pt calling checking on her labs results she had done Tuesday am

## 2013-05-16 ENCOUNTER — Telehealth: Payer: Self-pay | Admitting: Internal Medicine

## 2013-05-16 DIAGNOSIS — E119 Type 2 diabetes mellitus without complications: Secondary | ICD-10-CM

## 2013-05-16 NOTE — Telephone Encounter (Signed)
Pt is needing refill on on her Strips for the Accu-Check Aviva and for the medication. Pt uses [facility] in [place] and is needing it EScribed they will not take it called in.

## 2013-05-17 ENCOUNTER — Telehealth: Payer: Self-pay | Admitting: Internal Medicine

## 2013-05-17 MED ORDER — ACCU-CHEK AVIVA VI SOLN: 1.0000 | Freq: Once | Status: AC

## 2013-05-17 MED ORDER — GLUCOSE BLOOD VI STRP: ORAL_STRIP | Status: DC

## 2013-05-17 MED ORDER — ACCU-CHEK FASTCLIX LANCETS MISC: 1.0000 | Freq: Two times a day (BID) | Status: DC

## 2013-05-17 NOTE — Telephone Encounter (Signed)
Rx sent to the pharmacy.

## 2013-05-17 NOTE — Telephone Encounter (Signed)
Spoke with patient, she need lancets called to pharmacy. Rx sent to pharmacy

## 2013-05-17 NOTE — Telephone Encounter (Signed)
Patient needs lantus called in to [facility] in [place] she is going out of town in the morning. Please call in today and call patient once they have been called in.

## 2013-05-30 ENCOUNTER — Other Ambulatory Visit: Payer: Self-pay | Admitting: Internal Medicine

## 2013-06-16 ENCOUNTER — Ambulatory Visit (INDEPENDENT_AMBULATORY_CARE_PROVIDER_SITE_OTHER): Payer: Medicare Other | Admitting: Internal Medicine

## 2013-06-16 ENCOUNTER — Encounter: Payer: Self-pay | Admitting: Internal Medicine

## 2013-06-16 VITALS — BP 128/68 | HR 67 | Temp 98.3°F | Ht 66.5 in | Wt 272.0 lb

## 2013-06-16 DIAGNOSIS — M129 Arthropathy, unspecified: Secondary | ICD-10-CM | POA: Diagnosis not present

## 2013-06-16 DIAGNOSIS — Z23 Encounter for immunization: Secondary | ICD-10-CM | POA: Diagnosis not present

## 2013-06-16 DIAGNOSIS — E119 Type 2 diabetes mellitus without complications: Secondary | ICD-10-CM

## 2013-06-16 DIAGNOSIS — E669 Obesity, unspecified: Secondary | ICD-10-CM | POA: Diagnosis not present

## 2013-06-16 DIAGNOSIS — I119 Hypertensive heart disease without heart failure: Secondary | ICD-10-CM | POA: Diagnosis not present

## 2013-06-16 DIAGNOSIS — M1990 Unspecified osteoarthritis, unspecified site: Secondary | ICD-10-CM

## 2013-06-16 DIAGNOSIS — Z0000 Encounter for general adult medical examination without abnormal findings: Secondary | ICD-10-CM | POA: Diagnosis not present

## 2013-06-16 DIAGNOSIS — I059 Rheumatic mitral valve disease, unspecified: Secondary | ICD-10-CM | POA: Diagnosis not present

## 2013-06-16 DIAGNOSIS — E782 Mixed hyperlipidemia: Secondary | ICD-10-CM | POA: Diagnosis not present

## 2013-06-16 DIAGNOSIS — F4323 Adjustment disorder with mixed anxiety and depressed mood: Secondary | ICD-10-CM

## 2013-06-16 LAB — COMPREHENSIVE METABOLIC PANEL
Albumin: 4.3 g/dL (ref 3.5–5.2)
Alkaline Phosphatase: 88 U/L (ref 39–117)
CO2: 27 mEq/L (ref 19–32)
Glucose, Bld: 101 mg/dL — ABNORMAL HIGH (ref 70–99)
Potassium: 4.6 mEq/L (ref 3.5–5.1)
Sodium: 138 mEq/L (ref 135–145)
Total Protein: 7 g/dL (ref 6.0–8.3)

## 2013-06-16 LAB — MICROALBUMIN / CREATININE URINE RATIO
Creatinine,U: 127.9 mg/dL
Microalb Creat Ratio: 0.7 mg/g (ref 0.0–30.0)
Microalb, Ur: 0.9 mg/dL (ref 0.0–1.9)

## 2013-06-16 LAB — HM DIABETES FOOT EXAM: HM Diabetic Foot Exam: NORMAL

## 2013-06-16 MED ORDER — OXYBUTYNIN CHLORIDE ER 5 MG PO TB24: 5.0000 mg | ORAL_TABLET | Freq: Every day | ORAL | Status: DC

## 2013-06-16 MED ORDER — METFORMIN HCL 500 MG PO TABS: 500.0000 mg | ORAL_TABLET | Freq: Two times a day (BID) | ORAL | Status: DC

## 2013-06-16 MED ORDER — ACCU-CHEK FASTCLIX LANCETS MISC: 1.0000 | Freq: Two times a day (BID) | Status: DC

## 2013-06-16 MED ORDER — OMEPRAZOLE 20 MG PO CPDR: DELAYED_RELEASE_CAPSULE | ORAL | Status: DC

## 2013-06-16 MED ORDER — BUPROPION HCL ER (XL) 150 MG PO TB24: 150.0000 mg | ORAL_TABLET | Freq: Every day | ORAL | Status: DC

## 2013-06-16 MED ORDER — GLUCOSE BLOOD VI STRP: ORAL_STRIP | Status: DC

## 2013-06-16 MED ORDER — MELOXICAM 15 MG PO TABS: 15.0000 mg | ORAL_TABLET | Freq: Every day | ORAL | Status: DC

## 2013-06-16 NOTE — Assessment & Plan Note (Signed)
Review of recent blood sugar shows excellent control. We'll continue metformin. Plan to recheck A1c with labs at the end of September 2014.

## 2013-06-16 NOTE — Assessment & Plan Note (Signed)
Wt Readings from Last 3 Encounters:  06/16/13 272 lb (123.378 kg)  05/02/13 280 lb (127.007 kg)  03/21/13 275 lb (124.739 kg)   Encouraged continued effort at healthy diet and regular physical activity. Patient has lost 8 pounds since her last visit.

## 2013-06-16 NOTE — Progress Notes (Signed)
Subjective:    Patient ID: Darlene Robbins, female    DOB: [birth year], 72 y.o.   MRN: [id number]  HPI The patient is here for annual Medicare wellness examination and management of other chronic and acute problems.   The risk factors are reflected in the social history.  The roster of all physicians providing medical care to patient - is listed in the Snapshot section of the chart.  Activities of daily living:  The patient is 100% independent in all ADLs: dressing, toileting, feeding as well as independent mobility  Home safety : The patient has smoke detectors in the home. They wear seatbelts.  There are no firearms at home. There is no violence in the home.   There is no risks for hepatitis, STDs or HIV. There is no history of blood transfusion. They have no travel history to infectious disease endemic areas of the world.  The patient has seen their dentist in the last six month. Dentist - in [place] They have seen their eye doctor in the last year. Opthalmology - Dr. Oren Bracket No issues with hearing. They have deferred audiologic testing in the last year.   They do not  have excessive sun exposure. Discussed the need for sun protection: hats, long sleeves and use of sunscreen if there is significant sun exposure.  Dermatologist - Dr. Roseanne Reno  Diet: the importance of a healthy diet is discussed. They do have a healthy diet.  The benefits of regular aerobic exercise were discussed. She does not exercise on a regular basis, but does yardwork and housework.   Depression screen: there are no signs or vegative symptoms of depression- irritability, change in appetite, anhedonia, sadness/tearfullness. Symptoms much improved with wellbutrin.  Cognitive assessment: the patient manages all their financial and personal affairs and is actively engaged. They could relate day,date,year and events.  HCPOA not completed, however comfortable with children making decisions, Living Will in place.  The  following portions of the patient's history were reviewed and updated as appropriate: allergies, current medications, past family history, past medical history,  past surgical history, past social history  and problem list.  Visual acuity was not assessed per patient preference since she has regular follow up with her ophthalmologist. Hearing and body mass index were assessed and reviewed.   During the course of the visit the patient was educated and counseled about appropriate screening and preventive services including : fall prevention , diabetes screening, nutrition counseling, colorectal cancer screening, and recommended immunizations.    Outpatient Encounter Prescriptions as of 06/16/2013  Medication Sig Dispense Refill  . ACCU-CHEK FASTCLIX LANCETS MISC 1 each by Does not apply route 2 (two) times daily.  102 each  6  . amLODipine (NORVASC) 5 MG tablet Take 1 tablet (5 mg total) by mouth daily.  90 tablet  4  . Blood Glucose Calibration (ACCU-CHEK AVIVA) SOLN 1 each by In Vitro route once.  1 each  2  . buPROPion (WELLBUTRIN XL) 150 MG 24 hr tablet Take 1 tablet (150 mg total) by mouth daily.  30 tablet  3  . citalopram (CELEXA) 20 MG tablet Take 1 tablet (20 mg total) by mouth daily.  90 tablet  4  . diclofenac sodium (VOLTAREN) 1 % GEL Apply 1 application topically 2 (two) times daily as needed.        . furosemide (LASIX) 20 MG tablet Take 1 tablet (20 mg total) by mouth daily as needed.  90 tablet  4  . glucose blood (ACCU-CHEK  AVIVA) test strip Please test blood sugars at home 1-2 times a day.  100 each  12  . levothyroxine (SYNTHROID) 75 MCG tablet Take 1 tablet (75 mcg total) by mouth daily.  90 tablet  4  . meloxicam (MOBIC) 15 MG tablet Take 1 tablet (15 mg total) by mouth daily.  90 tablet  4  . metFORMIN (GLUCOPHAGE) 500 MG tablet Take 1 tablet (500 mg total) by mouth 2 (two) times daily with a meal.  90 tablet  3  . Multiple Vitamin (MULTIVITAMIN) tablet Take 1 tablet by mouth  daily.      Marland Kitchen nystatin (MYCOSTATIN) cream       . omeprazole (PRILOSEC) 20 MG capsule TAKE 1 CAPSULE TWICE DAILY  180 capsule  1  . oxybutynin (DITROPAN-XL) 5 MG 24 hr tablet Take 5 mg by mouth daily.      Marland Kitchen albuterol (VENTOLIN HFA) 108 (90 BASE) MCG/ACT inhaler Inhale 2 puffs into the lungs daily.        . fish oil-omega-3 fatty acids 1000 MG capsule Take 2 g by mouth daily.        . metoCLOPramide (REGLAN) 5 MG tablet Take 5 mg by mouth 2 (two) times daily as needed.        No facility-administered encounter medications on file as of 06/16/2013.   BP 128/68  Pulse 67  Temp(Src) 98.3 F (36.8 C) (Oral)  Ht 5' 6.5" (1.689 m)  Wt 272 lb (123.378 kg)  BMI 43.25 kg/m2  SpO2 97%    Review of Systems  Constitutional: Negative for fever, chills, appetite change, fatigue and unexpected weight change.  HENT: Negative for ear pain, congestion, sore throat, trouble swallowing, neck pain, voice change and sinus pressure.   Eyes: Negative for visual disturbance.  Respiratory: Negative for cough, shortness of breath, wheezing and stridor.   Cardiovascular: Negative for chest pain, palpitations and leg swelling.  Gastrointestinal: Negative for nausea, vomiting, abdominal pain, diarrhea, constipation, blood in stool, abdominal distention and anal bleeding.  Genitourinary: Negative for dysuria and flank pain.  Musculoskeletal: Negative for myalgias, arthralgias and gait problem.  Skin: Negative for color change and rash.  Neurological: Negative for dizziness and headaches.  Hematological: Negative for adenopathy. Does not bruise/bleed easily.  Psychiatric/Behavioral: Negative for suicidal ideas, sleep disturbance and dysphoric mood. The patient is not nervous/anxious.        Objective:   Physical Exam  Constitutional: She is oriented to person, place, and time. She appears well-developed and well-nourished. No distress.  HENT:  Head: Normocephalic and atraumatic.  Right Ear: External ear  normal.  Left Ear: External ear normal.  Nose: Nose normal.  Mouth/Throat: Oropharynx is clear and moist. No oropharyngeal exudate.  Eyes: Conjunctivae are normal. Pupils are equal, round, and reactive to light. Right eye exhibits no discharge. Left eye exhibits no discharge. No scleral icterus.  Neck: Normal range of motion. Neck supple. No tracheal deviation present. No thyromegaly present.  Cardiovascular: Normal rate, regular rhythm, normal heart sounds and intact distal pulses.  Exam reveals no gallop and no friction rub.   No murmur heard. Pulmonary/Chest: Effort normal and breath sounds normal. No accessory muscle usage. Not tachypneic. No respiratory distress. She has no decreased breath sounds. She has no wheezes. She has no rales. She exhibits no tenderness. Right breast exhibits no inverted nipple, no mass, no nipple discharge, no skin change and no tenderness. Left breast exhibits no inverted nipple, no mass, no nipple discharge, no skin change and no  tenderness. Breasts are symmetrical.  Abdominal: Soft. Bowel sounds are normal. She exhibits no distension and no mass. There is no tenderness. There is no rebound and no guarding.  Musculoskeletal: Normal range of motion. She exhibits no edema and no tenderness.  Lymphadenopathy:    She has no cervical adenopathy.  Neurological: She is alert and oriented to person, place, and time. No cranial nerve deficit. She exhibits normal muscle tone. Coordination normal.  Skin: Skin is warm and dry. No rash noted. She is not diaphoretic. No erythema. No pallor.  Psychiatric: She has a normal mood and affect. Her behavior is normal. Judgment and thought content normal.          Assessment & Plan:

## 2013-06-16 NOTE — Assessment & Plan Note (Signed)
General medical exam including breast exam normal today. Pap and pelvic deferred given patient's age and history of abnormal Paps. Mammogram ordered. Will check labs including CBC, CMP, lipid profile. Encouraged healthy diet and regular physical activity with goal of weight loss.

## 2013-07-29 ENCOUNTER — Ambulatory Visit: Payer: Self-pay | Admitting: Internal Medicine

## 2013-07-29 DIAGNOSIS — Z1231 Encounter for screening mammogram for malignant neoplasm of breast: Secondary | ICD-10-CM | POA: Diagnosis not present

## 2013-08-12 ENCOUNTER — Encounter: Payer: Self-pay | Admitting: Internal Medicine

## 2013-09-15 ENCOUNTER — Other Ambulatory Visit: Payer: Self-pay

## 2013-09-19 ENCOUNTER — Other Ambulatory Visit: Payer: Medicare Other

## 2013-09-20 ENCOUNTER — Telehealth: Payer: Self-pay | Admitting: *Deleted

## 2013-09-20 NOTE — Telephone Encounter (Signed)
Pt is coming in tomorrow what labs and dx?  

## 2013-09-20 NOTE — Telephone Encounter (Signed)
cmp and a1c 250.00

## 2013-09-21 ENCOUNTER — Other Ambulatory Visit (INDEPENDENT_AMBULATORY_CARE_PROVIDER_SITE_OTHER): Payer: Medicare Other

## 2013-09-21 DIAGNOSIS — E119 Type 2 diabetes mellitus without complications: Secondary | ICD-10-CM

## 2013-09-21 DIAGNOSIS — E1059 Type 1 diabetes mellitus with other circulatory complications: Secondary | ICD-10-CM

## 2013-09-21 LAB — COMPREHENSIVE METABOLIC PANEL
ALT: 29 U/L (ref 0–35)
AST: 34 U/L (ref 0–37)
Alkaline Phosphatase: 88 U/L (ref 39–117)
Calcium: 9.6 mg/dL (ref 8.4–10.5)
Chloride: 103 mEq/L (ref 96–112)
Creatinine, Ser: 0.8 mg/dL (ref 0.4–1.2)
Potassium: 4.7 mEq/L (ref 3.5–5.1)

## 2013-09-21 LAB — HEMOGLOBIN A1C: Hgb A1c MFr Bld: 6.4 % (ref 4.6–6.5)

## 2013-09-23 ENCOUNTER — Ambulatory Visit: Payer: Medicare Other | Admitting: Internal Medicine

## 2013-10-17 ENCOUNTER — Ambulatory Visit: Payer: Medicare Other | Admitting: Internal Medicine

## 2013-10-20 ENCOUNTER — Telehealth: Payer: Self-pay | Admitting: Internal Medicine

## 2013-10-20 ENCOUNTER — Ambulatory Visit (INDEPENDENT_AMBULATORY_CARE_PROVIDER_SITE_OTHER)
Admission: RE | Admit: 2013-10-20 | Discharge: 2013-10-20 | Disposition: A | Discharge status: Home / Self Care | Payer: Medicare Other | Source: Ambulatory Visit | Attending: Internal Medicine | Admitting: Internal Medicine

## 2013-10-20 ENCOUNTER — Ambulatory Visit (INDEPENDENT_AMBULATORY_CARE_PROVIDER_SITE_OTHER): Payer: Medicare Other | Admitting: Internal Medicine

## 2013-10-20 ENCOUNTER — Encounter: Payer: Self-pay | Admitting: Internal Medicine

## 2013-10-20 VITALS — BP 138/68 | HR 82 | Temp 97.9°F | Wt 277.0 lb

## 2013-10-20 DIAGNOSIS — R05 Cough: Secondary | ICD-10-CM

## 2013-10-20 DIAGNOSIS — R0600 Dyspnea, unspecified: Secondary | ICD-10-CM

## 2013-10-20 DIAGNOSIS — I10 Essential (primary) hypertension: Secondary | ICD-10-CM | POA: Diagnosis not present

## 2013-10-20 DIAGNOSIS — E119 Type 2 diabetes mellitus without complications: Secondary | ICD-10-CM

## 2013-10-20 DIAGNOSIS — R0609 Other forms of dyspnea: Secondary | ICD-10-CM

## 2013-10-20 MED ORDER — OMEPRAZOLE 20 MG PO CPDR: DELAYED_RELEASE_CAPSULE | ORAL | Status: DC

## 2013-10-20 MED ORDER — ALBUTEROL SULFATE HFA 108 (90 BASE) MCG/ACT IN AERS: 2.0000 | INHALATION_SPRAY | Freq: Every day | RESPIRATORY_TRACT | Status: DC

## 2013-10-20 NOTE — Assessment & Plan Note (Signed)
Chronic cough, with reported h/o asthma. Exam today is normal. CXR showed cardiomegaly. Reviewed ECHO from 03/2013 which showed normal EF. Suspect asthma, likely exacerbated with recent addition of dog in the home. Will set up pulmonary evaluation with PFTs. Will start Albuterol prn to see if any improvement.

## 2013-10-20 NOTE — Telephone Encounter (Signed)
No, I have not seen this

## 2013-10-20 NOTE — Telephone Encounter (Signed)
Have you seen any forms in reference to this?

## 2013-10-20 NOTE — Progress Notes (Signed)
Pre-visit discussion using our clinic review tool. No additional management support is needed unless otherwise documented below in the visit note.  

## 2013-10-20 NOTE — Assessment & Plan Note (Signed)
Suspect related to deconditioning and asthma. CXR normal today except for cardiomegaly. ECHO 03/2013 showed normal EF. Reviewed cardiology notes today. I cannot find record of recent stress test. Consider stress test in future, as dyspnea may be anginal equivalent for her. Will set up pulmonary evaluation with PFTs.

## 2013-10-20 NOTE — Telephone Encounter (Signed)
States they have faxed form to [place] today that needs to be completed by [place] and faxed back to [facility] regarding pt diabetic supplies for Medicare.  Fax to [phone].

## 2013-10-20 NOTE — Assessment & Plan Note (Signed)
Lab Results  Component Value Date   HGBA1C 6.4 09/21/2013   BG well controlled on metformin alone. Will continue. In review of records, I do not see that pt has ever been on ACEi. Will consider this at next visit.

## 2013-10-20 NOTE — Progress Notes (Signed)
Subjective:    Patient ID: Darlene Robbins, female    DOB: December 21, 1940, 72 y.o.   MRN: [id number]  HPI 72 year old female with history of obesity, diabetes, hypertension, hypothyroidism presents for followup. Her primary concern today is several month history of cough. She reports that cough is most prominent in the morning and productive of whitish sputum. It seems to subside during the day. She denies any fever, chills, chest pain. She does have some shortness of breath with exertion such as walking up a flight of stairs. In the distant past, she reports that she was diagnosed with asthma and treated with albuterol. She is not a smoker but her husband used to smoke in the home. She also recently got a new dog. She is not currently taking anything for cough or using any inhalers.  In regards to diabetes, recent blood sugar control has been excellent. She is compliant with medications.  Outpatient Encounter Prescriptions as of 10/20/2013  Medication Sig  . ACCU-CHEK FASTCLIX LANCETS MISC 1 each by Does not apply route 2 (two) times daily.  Marland Kitchen amLODipine (NORVASC) 5 MG tablet Take 1 tablet (5 mg total) by mouth daily.  . Blood Glucose Calibration (ACCU-CHEK AVIVA) SOLN 1 each by In Vitro route once.  Marland Kitchen buPROPion (WELLBUTRIN XL) 150 MG 24 hr tablet Take 1 tablet (150 mg total) by mouth daily.  . citalopram (CELEXA) 20 MG tablet Take 1 tablet (20 mg total) by mouth daily.  . diclofenac sodium (VOLTAREN) 1 % GEL Apply 1 application topically 2 (two) times daily as needed.    . fish oil-omega-3 fatty acids 1000 MG capsule Take 2 g by mouth daily.    . furosemide (LASIX) 20 MG tablet Take 1 tablet (20 mg total) by mouth daily as needed.  Marland Kitchen glucose blood (ACCU-CHEK AVIVA) test strip Please test blood sugars at home 1-2 times a day.  . levothyroxine (SYNTHROID) 75 MCG tablet Take 1 tablet (75 mcg total) by mouth daily.  . meloxicam (MOBIC) 15 MG tablet Take 1 tablet (15 mg total) by mouth daily.  .  metFORMIN (GLUCOPHAGE) 500 MG tablet Take 1 tablet (500 mg total) by mouth 2 (two) times daily with a meal.  . Multiple Vitamin (MULTIVITAMIN) tablet Take 1 tablet by mouth daily.  Marland Kitchen nystatin (MYCOSTATIN) cream   . omeprazole (PRILOSEC) 20 MG capsule TAKE 1 CAPSULE TWICE DAILY  . oxybutynin (DITROPAN-XL) 5 MG 24 hr tablet Take 1 tablet (5 mg total) by mouth daily.   BP 138/68  Pulse 82  Temp(Src) 97.9 F (36.6 C) (Oral)  Wt 277 lb (125.646 kg)  SpO2 99%  Review of Systems  Constitutional: Negative for fever, chills, appetite change, fatigue and unexpected weight change.  HENT: Negative for congestion, ear pain, sinus pressure, sore throat, trouble swallowing and voice change.   Eyes: Negative for visual disturbance.  Respiratory: Positive for cough and shortness of breath (with exertion such as walking 1 flight of stairs). Negative for wheezing and stridor.   Cardiovascular: Negative for chest pain, palpitations and leg swelling.  Gastrointestinal: Negative for nausea, vomiting, abdominal pain, diarrhea, constipation, blood in stool, abdominal distention and anal bleeding.  Genitourinary: Negative for dysuria and flank pain.  Musculoskeletal: Negative for arthralgias, gait problem, myalgias and neck pain.  Skin: Negative for color change and rash.  Neurological: Negative for dizziness and headaches.  Hematological: Negative for adenopathy. Does not bruise/bleed easily.  Psychiatric/Behavioral: Negative for suicidal ideas, sleep disturbance and dysphoric mood. The patient is not  nervous/anxious.        Objective:   Physical Exam  Constitutional: She is oriented to person, place, and time. She appears well-developed and well-nourished. No distress.  HENT:  Head: Normocephalic and atraumatic.  Right Ear: External ear normal.  Left Ear: External ear normal.  Nose: Nose normal.  Mouth/Throat: Oropharynx is clear and moist. No oropharyngeal exudate.  Eyes: Conjunctivae are normal.  Pupils are equal, round, and reactive to light. Right eye exhibits no discharge. Left eye exhibits no discharge. No scleral icterus.  Neck: Normal range of motion. Neck supple. No tracheal deviation present. No thyromegaly present.  Cardiovascular: Normal rate, regular rhythm, normal heart sounds and intact distal pulses.  Exam reveals no gallop and no friction rub.   No murmur heard. Pulmonary/Chest: Effort normal and breath sounds normal. No accessory muscle usage. Not tachypneic. No respiratory distress. She has no decreased breath sounds. She has no wheezes. She has no rhonchi. She has no rales. She exhibits no tenderness.  Musculoskeletal: Normal range of motion. She exhibits no edema and no tenderness.  Lymphadenopathy:    She has no cervical adenopathy.  Neurological: She is alert and oriented to person, place, and time. No cranial nerve deficit. She exhibits normal muscle tone. Coordination normal.  Skin: Skin is warm and dry. No rash noted. She is not diaphoretic. No erythema. No pallor.  Psychiatric: She has a normal mood and affect. Her behavior is normal. Judgment and thought content normal.          Assessment & Plan:

## 2013-10-20 NOTE — Assessment & Plan Note (Signed)
BP Readings from Last 3 Encounters:  10/20/13 138/68  06/16/13 128/68  05/02/13 128/62   BP has been well controlled. Unclear why she has never been on an ACEi. Will consider this next visit.

## 2013-10-21 NOTE — Telephone Encounter (Signed)
The patient has called [facility] and they will fax the request back to this office asking for her prescription to be changed from twice a day to once a week for her diabetic supplies.

## 2013-10-21 NOTE — Telephone Encounter (Signed)
Left message pt to have form refaxed

## 2013-10-24 NOTE — Telephone Encounter (Signed)
Noted  

## 2013-10-25 NOTE — Telephone Encounter (Signed)
Did you ever get this? I have not seen anything cross my path?

## 2013-10-26 NOTE — Telephone Encounter (Signed)
No, I have not seen this

## 2013-10-27 NOTE — Telephone Encounter (Signed)
They will re-fax form today and fax it back to [facility]

## 2013-10-27 NOTE — Telephone Encounter (Signed)
Form received, completed and faxed back to [facility].

## 2013-10-28 ENCOUNTER — Telehealth: Payer: Self-pay | Admitting: Internal Medicine

## 2013-10-28 NOTE — Telephone Encounter (Signed)
Form completed and re-faxed

## 2013-10-28 NOTE — Telephone Encounter (Signed)
[facility] states [facility] form was received but question #6 is not complete.  Please complete form and fax again to [phone].

## 2013-11-22 ENCOUNTER — Ambulatory Visit (INDEPENDENT_AMBULATORY_CARE_PROVIDER_SITE_OTHER): Payer: Medicare Other | Admitting: Pulmonary Disease

## 2013-11-22 ENCOUNTER — Encounter: Payer: Self-pay | Admitting: Pulmonary Disease

## 2013-11-22 VITALS — BP 138/80 | HR 69 | Temp 98.4°F | Ht 67.0 in | Wt 276.0 lb

## 2013-11-22 DIAGNOSIS — R0989 Other specified symptoms and signs involving the circulatory and respiratory systems: Secondary | ICD-10-CM

## 2013-11-22 DIAGNOSIS — R0609 Other forms of dyspnea: Secondary | ICD-10-CM | POA: Diagnosis not present

## 2013-11-22 DIAGNOSIS — R0600 Dyspnea, unspecified: Secondary | ICD-10-CM

## 2013-11-22 DIAGNOSIS — R059 Cough, unspecified: Secondary | ICD-10-CM | POA: Diagnosis not present

## 2013-11-22 DIAGNOSIS — R0602 Shortness of breath: Secondary | ICD-10-CM | POA: Diagnosis not present

## 2013-11-22 DIAGNOSIS — R05 Cough: Secondary | ICD-10-CM

## 2013-11-22 MED ORDER — PANTOPRAZOLE SODIUM 40 MG PO TBEC: 40.0000 mg | DELAYED_RELEASE_TABLET | Freq: Two times a day (BID) | ORAL | Status: DC

## 2013-11-22 NOTE — Progress Notes (Signed)
Subjective:    Patient ID: Darlene Robbins, female    DOB: [birth year], 73 y.o.   MRN: [id number]  HPI  Mr. Lomibao is here to see me for cough and shortness of breath. She says that she can push mow her yard and run the rota-tiller and vacuum without getting shortness of breath for long periods of time at a reasonable pace without difficulty. However, she gets short of breath with short bursts of activity such as climbing a flight of stairs.  She also notes dyspnea with lying in a supine position.  This will lead to wheezing at night.  Because of this she sits up in a chair at night.  When she feels exertional dyspnea she feels chest tightness and some wheezing.    Recently she had an overnight stay at [facility] (sometime in 2014) for what sounds like pulmonary edema.  She has noticed more ankle swelling lately and this is associated with worsening shortness of breath.  The swelling and breathing trouble improve with lasix.  She notes a "terrible cough" at night and in the mornings.  She has been told that she "whistles" with normal breathing in the mornings.  She often coughs up thick mucus which is white or clear in the mornings. She often goes for hours throughout the day without coughing but she often has coughing spells which will take her breath away. The cough has gone on for months.    She takes prilosec for acid reflux twice a day.  Despite this she still has heartburn despite this.    She also has sleep apnea and uses CPAP every night.  Her last CPAP titration study was two years ago.    She has some sinus congestion and post nasal drip.  She doesn't take anything for it right now.  She has more sinus congestion in the spring time with pollen.    She has been told that she has COPD which was mild and has previously seen Dr Vella Kohler.  He felt she may also have some asthma.    She has never smoked but her husband smoked and her coworkers always smoked.    She had a 24 hour pH probe not long  ago which was strongly positive. She recently made an effort to cut back on eating after 8PM, the cough improved some with this (less mucus production).   Past Medical History  Diagnosis Date  . Thyroid disease   . Kidney stones     laser surgery in the past  . COPD (chronic obstructive pulmonary disease)     followed by Dr.Fleming  . Arthritis     uses celebrex  . Sleep apnea     CPAP 7?  . Abdominal pain     Resolved,MRSA and yeast likely colonization in setting of multiple antibiotics.  . Vitamin D deficiency 07-11-2010    drisdol 50,000 units 1 weekly x 12 weeks no refill active     No family history on file.   History   Social History  . Marital Status: Married    Spouse Name: N/A    Number of Children: N/A  . Years of Education: N/A   Occupational History  . Not on file.   Social History Main Topics  . Smoking status: Never Smoker   . Smokeless tobacco: Never Used  . Alcohol Use: No  . Drug Use: No  . Sexual Activity: Not on file   Other Topics Concern  . Not on file  Social History Narrative  . No narrative on file     Allergies  Allergen Reactions  . Codeine   . Penicillins   . Sulfa Antibiotics      Outpatient Prescriptions Prior to Visit  Medication Sig Dispense Refill  . ACCU-CHEK FASTCLIX LANCETS MISC 1 each by Does not apply route 2 (two) times daily.  102 each  6  . albuterol (VENTOLIN HFA) 108 (90 BASE) MCG/ACT inhaler Inhale 2 puffs into the lungs daily.  18 g  6  . amLODipine (NORVASC) 5 MG tablet Take 1 tablet (5 mg total) by mouth daily.  90 tablet  4  . Blood Glucose Calibration (ACCU-CHEK AVIVA) SOLN 1 each by In Vitro route once.  1 each  2  . buPROPion (WELLBUTRIN XL) 150 MG 24 hr tablet Take 1 tablet (150 mg total) by mouth daily.  90 tablet  3  . citalopram (CELEXA) 20 MG tablet Take 1 tablet (20 mg total) by mouth daily.  90 tablet  4  . diclofenac sodium (VOLTAREN) 1 % GEL Apply 1 application topically 2 (two) times daily as  needed.        . fish oil-omega-3 fatty acids 1000 MG capsule Take 2 g by mouth daily.        . furosemide (LASIX) 20 MG tablet Take 1 tablet (20 mg total) by mouth daily as needed.  90 tablet  4  . glucose blood (ACCU-CHEK AVIVA) test strip Please test blood sugars at home 1-2 times a day.  100 each  12  . levothyroxine (SYNTHROID) 75 MCG tablet Take 1 tablet (75 mcg total) by mouth daily.  90 tablet  4  . meloxicam (MOBIC) 15 MG tablet Take 1 tablet (15 mg total) by mouth daily.  90 tablet  4  . metFORMIN (GLUCOPHAGE) 500 MG tablet Take 1 tablet (500 mg total) by mouth 2 (two) times daily with a meal.  180 tablet  3  . Multiple Vitamin (MULTIVITAMIN) tablet Take 1 tablet by mouth daily.      Marland Kitchen nystatin (MYCOSTATIN) cream       . omeprazole (PRILOSEC) 20 MG capsule TAKE 1 CAPSULE TWICE DAILY  180 capsule  4  . oxybutynin (DITROPAN-XL) 5 MG 24 hr tablet Take 1 tablet (5 mg total) by mouth daily.  90 tablet  4   No facility-administered medications prior to visit.       Review of Systems  Constitutional: Negative for fever and unexpected weight change.  HENT: Positive for congestion, sinus pressure and trouble swallowing. Negative for dental problem, ear pain, nosebleeds, postnasal drip, rhinorrhea, sneezing and sore throat.   Eyes: Negative for redness and itching.  Respiratory: Positive for cough, chest tightness, shortness of breath and wheezing.   Cardiovascular: Negative for palpitations and leg swelling.  Gastrointestinal: Negative for nausea and vomiting.  Genitourinary: Negative for dysuria.  Musculoskeletal: Negative for joint swelling.  Skin: Negative for rash.  Neurological: Negative for headaches.  Hematological: Does not bruise/bleed easily.  Psychiatric/Behavioral: Negative for dysphoric mood. The patient is not nervous/anxious.        Objective:   Physical Exam Filed Vitals:   11/22/13 1416  BP: 138/80  Pulse: 69  Temp: 98.4 F (36.9 C)  TempSrc: Oral  Height:  5\' 7"  (1.702 m)  Weight: 276 lb (125.193 kg)  SpO2: 98%   RA  Ambulated 500 feet on RA > did not desaturate < 93% RA  Gen: well appearing, obese, no acute distress HEENT: NCAT,  PERRL, EOMi, OP clear, neck supple without masses PULM: CTA B CV: RRR, no mgr, no JVD AB: BS+, soft, nontender, no hsm Ext: warm, no edema, no clubbing, no cyanosis Derm: no rash or skin breakdown Neuro: A&Ox4, CN II-XII intact, strength 5/5 in all 4 extremities       Assessment & Plan:   Dyspnea I think the most likely etiology of Ms. Holdsworth's dyspnea is diastolic dysfunction based on her ankle swelling, orthopnea, and dyspnea that responds to lasix. She is also obese which contributes to her dyspnea.   Today's spirometry is completely normal, so she does not have COPD.  She could have occult lung disease, but her normal lung exam, oximetry, and recent Chest x-ray would suggest otherwise.  Other possible explanations for her dyspnea include tracheomalacia which occurs in obese folks and can cause orthopnea.  The only way to sort this out is to do an airway protocol CT.  Plan: -encouraged weight loss -Full PFT -discuss possibility of diastolic dysfunction with cardiologist later this month -CT chest to look at pulmonary parenchyma and airway -discuss   Cough I don't think this is due to a lung problem, see explanation above.  The most likely etiology is GERD since her cough recently improved by not eating after 8PM.  Post nasal drip is also like contributing.  Plan: -GERD lifestyle modification reviewed -if no improvement with GERD lifestyle changes, double PPI -if no improvement with that, then start allergic rhinitis therapy (nasal steroid, instructions given)   Updated Medication List Outpatient Encounter Prescriptions as of 11/22/2013  Medication Sig  . ACCU-CHEK FASTCLIX LANCETS MISC 1 each by Does not apply route 2 (two) times daily.  Marland Kitchen albuterol (VENTOLIN HFA) 108 (90 BASE) MCG/ACT inhaler  Inhale 2 puffs into the lungs daily.  Marland Kitchen amLODipine (NORVASC) 5 MG tablet Take 1 tablet (5 mg total) by mouth daily.  . Blood Glucose Calibration (ACCU-CHEK AVIVA) SOLN 1 each by In Vitro route once.  Marland Kitchen buPROPion (WELLBUTRIN XL) 150 MG 24 hr tablet Take 1 tablet (150 mg total) by mouth daily.  . citalopram (CELEXA) 20 MG tablet Take 1 tablet (20 mg total) by mouth daily.  . diclofenac sodium (VOLTAREN) 1 % GEL Apply 1 application topically 2 (two) times daily as needed.    . fish oil-omega-3 fatty acids 1000 MG capsule Take 2 g by mouth daily.    . furosemide (LASIX) 20 MG tablet Take 1 tablet (20 mg total) by mouth daily as needed.  Marland Kitchen glucose blood (ACCU-CHEK AVIVA) test strip Please test blood sugars at home 1-2 times a day.  . levothyroxine (SYNTHROID) 75 MCG tablet Take 1 tablet (75 mcg total) by mouth daily.  . meloxicam (MOBIC) 15 MG tablet Take 1 tablet (15 mg total) by mouth daily.  . metFORMIN (GLUCOPHAGE) 500 MG tablet Take 1 tablet (500 mg total) by mouth 2 (two) times daily with a meal.  . Multiple Vitamin (MULTIVITAMIN) tablet Take 1 tablet by mouth daily.  Marland Kitchen nystatin (MYCOSTATIN) cream   . omeprazole (PRILOSEC) 20 MG capsule TAKE 1 CAPSULE TWICE DAILY  . oxybutynin (DITROPAN-XL) 5 MG 24 hr tablet Take 1 tablet (5 mg total) by mouth daily.  . pantoprazole (PROTONIX) 40 MG tablet Take 1 tablet (40 mg total) by mouth 2 (two) times daily.

## 2013-11-22 NOTE — Assessment & Plan Note (Signed)
I think the most likely etiology of Darlene Robbins's dyspnea is diastolic dysfunction based on her ankle swelling, orthopnea, and dyspnea that responds to lasix. She is also obese which contributes to her dyspnea.   Today's spirometry is completely normal, so she does not have COPD.  She could have occult lung disease, but her normal lung exam, oximetry, and recent Chest x-ray would suggest otherwise.  Other possible explanations for her dyspnea include tracheomalacia which occurs in obese folks and can cause orthopnea.  The only way to sort this out is to do an airway protocol CT.  Plan: -encouraged weight loss -Full PFT -discuss possibility of diastolic dysfunction with cardiologist later this month -CT chest to look at pulmonary parenchyma and airway -discuss

## 2013-11-22 NOTE — Assessment & Plan Note (Signed)
I don't think this is due to a lung problem, see explanation above.  The most likely etiology is GERD since her cough recently improved by not eating after 8PM.  Post nasal drip is also like contributing.  Plan: -GERD lifestyle modification reviewed -if no improvement with GERD lifestyle changes, double PPI -if no improvement with that, then start allergic rhinitis therapy (nasal steroid, instructions given)

## 2013-11-22 NOTE — Patient Instructions (Signed)
For the shortness of breath: We will arrange full pulmonary function testing and a CT of your chest at [facility] Talk to your cardiologist about congestive heart failure Stay active and try to lose weight  For the cough: First focus on acid reflux  Follow the GERD lifestyle modification sheet we gave you If this doesn't help, then fill the prescription for pantoprazole 40mg  twice a day   If that doesn't help, then focus on the sinuses: start taking nasacort two puffs each nostril daily and saline rinses; also try using chlorpheniramine and phenylephrine for nasal congestion (ask the pharmacist to help you find these).  We will see you back in 4-6 weeks or sooner if needed

## 2013-11-25 ENCOUNTER — Ambulatory Visit: Payer: Self-pay | Admitting: Pulmonary Disease

## 2013-11-25 DIAGNOSIS — R0602 Shortness of breath: Secondary | ICD-10-CM | POA: Diagnosis not present

## 2013-12-01 DIAGNOSIS — G4730 Sleep apnea, unspecified: Secondary | ICD-10-CM | POA: Diagnosis not present

## 2013-12-01 DIAGNOSIS — J449 Chronic obstructive pulmonary disease, unspecified: Secondary | ICD-10-CM | POA: Diagnosis not present

## 2013-12-01 DIAGNOSIS — R0609 Other forms of dyspnea: Secondary | ICD-10-CM | POA: Diagnosis not present

## 2013-12-01 DIAGNOSIS — R609 Edema, unspecified: Secondary | ICD-10-CM | POA: Diagnosis not present

## 2013-12-02 ENCOUNTER — Encounter: Payer: Self-pay | Admitting: Pulmonary Disease

## 2013-12-05 ENCOUNTER — Telehealth: Payer: Self-pay

## 2013-12-05 NOTE — Telephone Encounter (Signed)
Pt aware of ct chest results.  Nothing further needed.

## 2013-12-05 NOTE — Telephone Encounter (Signed)
Message copied by Len Blalock on Mon Dec 05, 2013  2:51 PM ------      Message from: Darlene Robbins      Created: Fri Dec 02, 2013  8:35 PM       A,            Please let her know that her CT chest looked ok            Thanks      B ------

## 2013-12-06 ENCOUNTER — Ambulatory Visit: Payer: Self-pay | Admitting: Pulmonary Disease

## 2013-12-06 DIAGNOSIS — R0602 Shortness of breath: Secondary | ICD-10-CM | POA: Diagnosis not present

## 2013-12-06 LAB — PULMONARY FUNCTION TEST

## 2013-12-07 DIAGNOSIS — J449 Chronic obstructive pulmonary disease, unspecified: Secondary | ICD-10-CM | POA: Diagnosis not present

## 2013-12-09 DIAGNOSIS — I209 Angina pectoris, unspecified: Secondary | ICD-10-CM | POA: Diagnosis not present

## 2013-12-14 ENCOUNTER — Encounter: Payer: Self-pay | Admitting: Pulmonary Disease

## 2013-12-14 ENCOUNTER — Telehealth: Payer: Self-pay

## 2013-12-14 NOTE — Telephone Encounter (Signed)
Message copied by Len Blalock on Wed Dec 14, 2013  9:10 AM ------      Message from: Simonne Maffucci B      Created: Wed Dec 14, 2013  8:49 AM       A,            Please let her know that her PFTs showed some improvement to Albuterol which may mean she has some asthma            I would like her to try some Advair for a while; one puff 250/50 twice a day.  Tell her she can pick up a sample from [place] here in [place].            Thanks      B ------

## 2013-12-14 NOTE — Telephone Encounter (Signed)
Spoke with pt, she is aware of PFT results and recs.  She is coming in today to pick up her advair sample.  I will go over how to use this with her.  Nothing further needed.

## 2013-12-15 ENCOUNTER — Other Ambulatory Visit: Payer: Self-pay

## 2013-12-15 DIAGNOSIS — R0602 Shortness of breath: Secondary | ICD-10-CM | POA: Diagnosis not present

## 2013-12-15 DIAGNOSIS — I2789 Other specified pulmonary heart diseases: Secondary | ICD-10-CM | POA: Diagnosis not present

## 2013-12-15 DIAGNOSIS — E782 Mixed hyperlipidemia: Secondary | ICD-10-CM | POA: Diagnosis not present

## 2013-12-15 DIAGNOSIS — R609 Edema, unspecified: Secondary | ICD-10-CM | POA: Diagnosis not present

## 2013-12-15 MED ORDER — FLUTICASONE-SALMETEROL 250-50 MCG/DOSE IN AEPB: 1.0000 | INHALATION_SPRAY | Freq: Two times a day (BID) | RESPIRATORY_TRACT | Status: DC

## 2014-01-17 ENCOUNTER — Encounter: Payer: Self-pay | Admitting: Pulmonary Disease

## 2014-01-19 ENCOUNTER — Ambulatory Visit: Payer: Medicare Other | Admitting: Internal Medicine

## 2014-02-09 ENCOUNTER — Encounter: Payer: Self-pay | Admitting: Internal Medicine

## 2014-02-09 ENCOUNTER — Ambulatory Visit (INDEPENDENT_AMBULATORY_CARE_PROVIDER_SITE_OTHER): Payer: Medicare Other | Admitting: Internal Medicine

## 2014-02-09 VITALS — BP 124/70 | HR 83 | Temp 97.9°F | Wt 266.0 lb

## 2014-02-09 DIAGNOSIS — F4323 Adjustment disorder with mixed anxiety and depressed mood: Secondary | ICD-10-CM

## 2014-02-09 DIAGNOSIS — I10 Essential (primary) hypertension: Secondary | ICD-10-CM

## 2014-02-09 DIAGNOSIS — R0609 Other forms of dyspnea: Secondary | ICD-10-CM

## 2014-02-09 DIAGNOSIS — F411 Generalized anxiety disorder: Secondary | ICD-10-CM

## 2014-02-09 DIAGNOSIS — R0989 Other specified symptoms and signs involving the circulatory and respiratory systems: Secondary | ICD-10-CM

## 2014-02-09 DIAGNOSIS — E119 Type 2 diabetes mellitus without complications: Secondary | ICD-10-CM

## 2014-02-09 DIAGNOSIS — F419 Anxiety disorder, unspecified: Secondary | ICD-10-CM

## 2014-02-09 DIAGNOSIS — E039 Hypothyroidism, unspecified: Secondary | ICD-10-CM

## 2014-02-09 DIAGNOSIS — R0600 Dyspnea, unspecified: Secondary | ICD-10-CM

## 2014-02-09 LAB — COMPREHENSIVE METABOLIC PANEL
ALT: 28 U/L (ref 0–35)
AST: 30 U/L (ref 0–37)
Albumin: 4.1 g/dL (ref 3.5–5.2)
Alkaline Phosphatase: 93 U/L (ref 39–117)
BUN: 15 mg/dL (ref 6–23)
CALCIUM: 9.2 mg/dL (ref 8.4–10.5)
CHLORIDE: 101 meq/L (ref 96–112)
CO2: 25 meq/L (ref 19–32)
Creatinine, Ser: 0.9 mg/dL (ref 0.4–1.2)
GFR: 64.53 mL/min (ref >60.00)
Glucose, Bld: 147 mg/dL — ABNORMAL HIGH (ref 70–99)
Potassium: 4.6 mEq/L (ref 3.5–5.1)
Sodium: 137 mEq/L (ref 135–145)
Total Bilirubin: 1 mg/dL (ref 0.3–1.2)
Total Protein: 6.5 g/dL (ref 6.0–8.3)

## 2014-02-09 LAB — MICROALBUMIN / CREATININE URINE RATIO
Creatinine,U: 300.6 mg/dL
MICROALB/CREAT RATIO: 0.5 mg/g (ref 0.0–30.0)
Microalb, Ur: 1.4 mg/dL (ref 0.0–1.9)

## 2014-02-09 LAB — HEMOGLOBIN A1C: Hgb A1c MFr Bld: 6.6 % — ABNORMAL HIGH (ref 4.6–6.5)

## 2014-02-09 LAB — LIPID PANEL
CHOL/HDL RATIO: 4
Cholesterol: 179 mg/dL (ref 0–200)
HDL: 43.3 mg/dL (ref >39.00)
LDL CALC: 104 mg/dL — AB (ref 0–99)
Triglycerides: 159 mg/dL — ABNORMAL HIGH (ref 0.0–149.0)
VLDL: 31.8 mg/dL (ref 0.0–40.0)

## 2014-02-09 LAB — TSH: TSH: 2.27 u[IU]/mL (ref 0.35–5.50)

## 2014-02-09 NOTE — Assessment & Plan Note (Addendum)
Pt did not bring record of BG today. Will check A1c with labs. Continue metformin. Will check renal function and LFTs with labs today. Consider adding Lisinopril. Consider adding atorvastatin.

## 2014-02-09 NOTE — Assessment & Plan Note (Signed)
Persistent symptoms of dyspnea with exertion. Reviewed CT chest with patient today. Patient reports recent stress test was normal. Suspect a significant component of her symptoms related to deconditioning and morbid obesity. Question if she might benefit from pulmonary rehabilitation.

## 2014-02-09 NOTE — Patient Instructions (Signed)
Decrease Citalopram to 10mg  daily (1/2 tablet) for 1-2 weeks, then stop.  Call if any questions or concerns.

## 2014-02-09 NOTE — Assessment & Plan Note (Signed)
BP Readings from Last 3 Encounters:  02/09/14 124/70  11/22/13 138/80  10/20/13 138/68   Blood pressure well-controlled on current medications. Will continue.

## 2014-02-09 NOTE — Progress Notes (Signed)
Subjective:    Patient ID: Darlene Robbins, female    DOB: 10-17-1941, 73 y.o.   MRN: [id number]  HPI 73YO female with DM presents for follow up.  DM - BG have been well controlled. Max 150. Compliant with meds.  HTN - Compliant with meds. NO chest pain or headache.  Depression - symptoms have been improved. She would like to taper off her Celexa.  Dyspnea - evaluation including CT chest was normal. She was also seen by cardiology and had stress test which she reports was normal. She continues to use Advair with some improvement in symptoms. However, she acknowledges significant shortness of breath with minimal exertion such as walking from her car into our office. Follow up with pulmonary is scheduled for Monday.  Review of Systems  Constitutional: Negative for fever, chills, appetite change, fatigue and unexpected weight change.  HENT: Negative for congestion, ear pain, sinus pressure, sore throat, trouble swallowing and voice change.   Eyes: Negative for visual disturbance.  Respiratory: Positive for shortness of breath. Negative for cough, wheezing and stridor.   Cardiovascular: Negative for chest pain, palpitations and leg swelling.  Gastrointestinal: Negative for nausea, vomiting, abdominal pain, diarrhea, constipation, blood in stool, abdominal distention and anal bleeding.  Genitourinary: Negative for dysuria and flank pain.  Musculoskeletal: Negative for arthralgias, gait problem, myalgias and neck pain.  Skin: Negative for color change and rash.  Neurological: Negative for dizziness and headaches.  Hematological: Negative for adenopathy. Does not bruise/bleed easily.  Psychiatric/Behavioral: Negative for suicidal ideas, sleep disturbance and dysphoric mood. The patient is not nervous/anxious.        Objective:    BP 124/70  Pulse 83  Temp(Src) 97.9 F (36.6 C) (Oral)  Wt 266 lb (120.657 kg)  SpO2 97% Physical Exam  Constitutional: She is oriented to person, place,  and time. She appears well-developed and well-nourished. No distress.  HENT:  Head: Normocephalic and atraumatic.  Right Ear: External ear normal.  Left Ear: External ear normal.  Nose: Nose normal.  Mouth/Throat: Oropharynx is clear and moist. No oropharyngeal exudate.  Eyes: Conjunctivae are normal. Pupils are equal, round, and reactive to light. Right eye exhibits no discharge. Left eye exhibits no discharge. No scleral icterus.  Neck: Normal range of motion. Neck supple. No tracheal deviation present. No thyromegaly present.  Cardiovascular: Normal rate, regular rhythm, normal heart sounds and intact distal pulses.  Exam reveals no gallop and no friction rub.   No murmur heard. Pulmonary/Chest: Effort normal and breath sounds normal. No accessory muscle usage. Not tachypneic. No respiratory distress. She has no decreased breath sounds. She has no wheezes. She has no rhonchi. She has no rales. She exhibits no tenderness.  Musculoskeletal: Normal range of motion. She exhibits no edema and no tenderness.  Lymphadenopathy:    She has no cervical adenopathy.  Neurological: She is alert and oriented to person, place, and time. No cranial nerve deficit. She exhibits normal muscle tone. Coordination normal.  Skin: Skin is warm and dry. No rash noted. She is not diaphoretic. No erythema. No pallor.  Psychiatric: She has a normal mood and affect. Her behavior is normal. Judgment and thought content normal.          Assessment & Plan:   Problem List Items Addressed This Visit   Adjustment disorder with mixed anxiety and depressed mood     Symptoms have improved. Will taper her Celexa to 10 mg daily for 2 weeks and then stop. Continue Wellbutrin.  Anxiety   Dyspnea     Persistent symptoms of dyspnea with exertion. Reviewed CT chest with patient today. Patient reports recent stress test was normal. Suspect a significant component of her symptoms related to deconditioning and morbid obesity.  Question if she might benefit from pulmonary rehabilitation.    Hypertension      BP Readings from Last 3 Encounters:  02/09/14 124/70  11/22/13 138/80  10/20/13 138/68   Blood pressure well-controlled on current medications. Will continue.    Hypothyroidism     Will check TSH with labs.    Relevant Orders      TSH   Type II or unspecified type diabetes mellitus without mention of complication, not stated as uncontrolled - Primary     Pt did not bring record of BG today. Will check A1c with labs. Continue metformin. Will check renal function and LFTs with labs today. Consider adding Lisinopril. Consider adding atorvastatin.    Relevant Orders      Comprehensive metabolic panel      Hemoglobin A1c      Lipid panel      Microalbumin / creatinine urine ratio       Return in about 3 months (around 05/11/2014) for Recheck of Diabetes.

## 2014-02-09 NOTE — Assessment & Plan Note (Signed)
Symptoms have improved. Will taper her Celexa to 10 mg daily for 2 weeks and then stop. Continue Wellbutrin.

## 2014-02-09 NOTE — Progress Notes (Signed)
Pre visit review using our clinic review tool, if applicable. No additional management support is needed unless otherwise documented below in the visit note. 

## 2014-02-09 NOTE — Assessment & Plan Note (Signed)
Will check TSH with labs. 

## 2014-02-13 ENCOUNTER — Ambulatory Visit (INDEPENDENT_AMBULATORY_CARE_PROVIDER_SITE_OTHER): Payer: Medicare Other | Admitting: Pulmonary Disease

## 2014-02-13 ENCOUNTER — Encounter: Payer: Self-pay | Admitting: Pulmonary Disease

## 2014-02-13 VITALS — BP 130/68 | HR 74 | Ht 67.0 in | Wt 279.0 lb

## 2014-02-13 DIAGNOSIS — R05 Cough: Secondary | ICD-10-CM | POA: Diagnosis not present

## 2014-02-13 DIAGNOSIS — R0989 Other specified symptoms and signs involving the circulatory and respiratory systems: Secondary | ICD-10-CM

## 2014-02-13 DIAGNOSIS — R059 Cough, unspecified: Secondary | ICD-10-CM

## 2014-02-13 DIAGNOSIS — R0609 Other forms of dyspnea: Secondary | ICD-10-CM

## 2014-02-13 DIAGNOSIS — J45909 Unspecified asthma, uncomplicated: Secondary | ICD-10-CM | POA: Insufficient documentation

## 2014-02-13 DIAGNOSIS — R0600 Dyspnea, unspecified: Secondary | ICD-10-CM

## 2014-02-13 MED ORDER — PANTOPRAZOLE SODIUM 40 MG PO TBEC: 40.0000 mg | DELAYED_RELEASE_TABLET | Freq: Two times a day (BID) | ORAL | Status: DC

## 2014-02-13 MED ORDER — ALBUTEROL SULFATE HFA 108 (90 BASE) MCG/ACT IN AERS: 2.0000 | INHALATION_SPRAY | Freq: Every day | RESPIRATORY_TRACT | Status: DC

## 2014-02-13 NOTE — Assessment & Plan Note (Signed)
This improved greatly with increasing the dose of her Protonix and close adherence to a GERD diet.  Plan: -Continue GERD lifestyle modification -Protonix twice a day

## 2014-02-13 NOTE — Patient Instructions (Signed)
Take the protonix two times per day Take the Advair one puff bid Exercise regularly and lose weight If you can get the dog out of the house, try stopping the Advair.  If you feel more short of breath resume the advair Use albuterol 2 puffs every four hours as needed for shortness of breath  Follow up with me in 6 months

## 2014-02-13 NOTE — Assessment & Plan Note (Signed)
This problem is multifactorial due to both asthma and obesity and deconditioning.  In addition to treating asthma with Advair I have recommended diet, exercise and weight loss.

## 2014-02-13 NOTE — Progress Notes (Signed)
Subjective:    Patient ID: Darlene Robbins, female    DOB: [birth year], 73 y.o.   MRN: [id number]  Synopsis:: 73 year-old female with morbid obesity first seen by the [facility] pulmonary clinic in 2015 for shortness of breath. Pulmonary function testing showed significant airflow reversibility and a CT scan of her chest showed air trapping. All this was felt likely to be consistent with asthma. Her shortness of breath is compounded by deconditioning and obesity. She also had cough which was related to acid reflux.  HPI  02/13/2014 ROV >  Darlene Robbins has been doing well since the last visit. She started taking Advair twice a day and since then she has been feeling much less shortness of breath. She has not had to use the albuterol inhaler since starting the Advair. Her cough is also improved markedly. She is taking Protonix twice a day and has been very careful about not eating before lying flat. She says that her cough is nearly completely resolved. She thinks that the reason why she has asthma is because she recently brought a dog into the house.   Past Medical History  Diagnosis Date  . Thyroid disease   . Kidney stones     laser surgery in the past  . COPD (chronic obstructive pulmonary disease)     followed by Dr.Fleming  . Arthritis     uses celebrex  . Sleep apnea     CPAP 7?  . Abdominal pain     Resolved,MRSA and yeast likely colonization in setting of multiple antibiotics.  . Vitamin D deficiency 07-11-2010    drisdol 50,000 units 1 weekly x 12 weeks no refill active     Review of Systems     Objective:   Physical Exam  Filed Vitals:   02/13/14 1208  BP: 130/68  Pulse: 74  Height: 5\' 7"  (1.702 m)  Weight: 279 lb (126.554 kg)  SpO2: 100%   Gen: well appearing, no acute distress HEENT: NCAT, EOMi, OP clear PULM: CTA B CV: RRR, no mgr, no JVD AB: BS+, soft, nontender, no hsm Ext: warm, no edema, no clubbing, no cyanosis       Assessment & Plan:    Dyspnea This problem is multifactorial due to both asthma and obesity and deconditioning.  In addition to treating asthma with Advair I have recommended diet, exercise and weight loss.  Intrinsic asthma She had a airflow reversibility on her pulmonary function testing which was clinically significant. Combined with her CT chest which showed air trapping consistent with asthma I feel confident with the diagnosis of asthma. She thinks that it may be related to a dog which she brought in to the house recently.  She has done well with the Advair.  Plan: -Continue Advair twice a day of the 250/50 dose -Get the dog out of the house -After the dog is out of the house try to stop the Advair -Followup with me in 6 months -Use albuterol when necessary  Cough This improved greatly with increasing the dose of her Protonix and close adherence to a GERD diet.  Plan: -Continue GERD lifestyle modification -Protonix twice a day   Updated Medication List Outpatient Encounter Prescriptions as of 02/13/2014  Medication Sig  . ACCU-CHEK FASTCLIX LANCETS MISC 1 each by Does not apply route 2 (two) times daily.  Marland Kitchen amLODipine (NORVASC) 5 MG tablet Take 1 tablet (5 mg total) by mouth daily.  . Blood Glucose Calibration (ACCU-CHEK AVIVA) SOLN 1 each  by In Vitro route once.  Marland Kitchen buPROPion (WELLBUTRIN XL) 150 MG 24 hr tablet Take 1 tablet (150 mg total) by mouth daily.  . citalopram (CELEXA) 20 MG tablet Take 10 mg by mouth daily.  . diclofenac sodium (VOLTAREN) 1 % GEL Apply 1 application topically 2 (two) times daily as needed.    . fish oil-omega-3 fatty acids 1000 MG capsule Take 2 g by mouth daily.    . Fluticasone-Salmeterol (ADVAIR DISKUS) 250-50 MCG/DOSE AEPB Inhale 1 puff into the lungs 2 (two) times daily.  . furosemide (LASIX) 20 MG tablet Take 1 tablet (20 mg total) by mouth daily as needed.  Marland Kitchen glucose blood (ACCU-CHEK AVIVA) test strip Please test blood sugars at home 1-2 times a day.  .  levothyroxine (SYNTHROID) 75 MCG tablet Take 1 tablet (75 mcg total) by mouth daily.  . meloxicam (MOBIC) 15 MG tablet Take 1 tablet (15 mg total) by mouth daily.  . metFORMIN (GLUCOPHAGE) 500 MG tablet Take 1 tablet (500 mg total) by mouth 2 (two) times daily with a meal.  . Multiple Vitamin (MULTIVITAMIN) tablet Take 1 tablet by mouth daily.  Marland Kitchen nystatin (MYCOSTATIN) cream Apply 1 application topically 3 (three) times daily as needed.   Marland Kitchen oxybutynin (DITROPAN-XL) 5 MG 24 hr tablet Take 1 tablet (5 mg total) by mouth daily.  . pantoprazole (PROTONIX) 40 MG tablet Take 1 tablet (40 mg total) by mouth 2 (two) times daily.  . [DISCONTINUED] citalopram (CELEXA) 20 MG tablet Take 1 tablet (20 mg total) by mouth daily.  . [DISCONTINUED] pantoprazole (PROTONIX) 40 MG tablet Take 1 tablet (40 mg total) by mouth 2 (two) times daily.  Marland Kitchen albuterol (VENTOLIN HFA) 108 (90 BASE) MCG/ACT inhaler Inhale 2 puffs into the lungs daily.  . [DISCONTINUED] albuterol (VENTOLIN HFA) 108 (90 BASE) MCG/ACT inhaler Inhale 2 puffs into the lungs daily.

## 2014-02-13 NOTE — Assessment & Plan Note (Signed)
She had a airflow reversibility on her pulmonary function testing which was clinically significant. Combined with her CT chest which showed air trapping consistent with asthma I feel confident with the diagnosis of asthma. She thinks that it may be related to a dog which she brought in to the house recently.  She has done well with the Advair.  Plan: -Continue Advair twice a day of the 250/50 dose -Get the dog out of the house -After the dog is out of the house try to stop the Advair -Followup with me in 6 months -Use albuterol when necessary

## 2014-02-20 DIAGNOSIS — R079 Chest pain, unspecified: Secondary | ICD-10-CM | POA: Diagnosis not present

## 2014-02-20 DIAGNOSIS — E119 Type 2 diabetes mellitus without complications: Secondary | ICD-10-CM | POA: Diagnosis not present

## 2014-02-20 DIAGNOSIS — R071 Chest pain on breathing: Secondary | ICD-10-CM | POA: Diagnosis not present

## 2014-02-20 DIAGNOSIS — R109 Unspecified abdominal pain: Secondary | ICD-10-CM | POA: Diagnosis not present

## 2014-02-20 DIAGNOSIS — J45909 Unspecified asthma, uncomplicated: Secondary | ICD-10-CM | POA: Diagnosis not present

## 2014-03-16 ENCOUNTER — Other Ambulatory Visit: Payer: Self-pay

## 2014-03-16 MED ORDER — FLUTICASONE-SALMETEROL 250-50 MCG/DOSE IN AEPB: 1.0000 | INHALATION_SPRAY | Freq: Two times a day (BID) | RESPIRATORY_TRACT | Status: DC

## 2014-03-20 DIAGNOSIS — N390 Urinary tract infection, site not specified: Secondary | ICD-10-CM | POA: Diagnosis not present

## 2014-04-06 ENCOUNTER — Ambulatory Visit (INDEPENDENT_AMBULATORY_CARE_PROVIDER_SITE_OTHER): Payer: Medicare Other | Admitting: Adult Health

## 2014-04-06 ENCOUNTER — Telehealth: Payer: Self-pay | Admitting: *Deleted

## 2014-04-06 ENCOUNTER — Encounter: Payer: Self-pay | Admitting: Adult Health

## 2014-04-06 VITALS — BP 130/80 | HR 74 | Temp 97.7°F | Resp 14 | Wt 271.5 lb

## 2014-04-06 DIAGNOSIS — R3129 Other microscopic hematuria: Secondary | ICD-10-CM

## 2014-04-06 DIAGNOSIS — R42 Dizziness and giddiness: Secondary | ICD-10-CM | POA: Diagnosis not present

## 2014-04-06 LAB — CBC WITH DIFFERENTIAL/PLATELET
BASOS PCT: 0.4 % (ref 0.0–3.0)
Basophils Absolute: 0 10*3/uL (ref 0.0–0.1)
Eosinophils Absolute: 0.3 10*3/uL (ref 0.0–0.7)
Eosinophils Relative: 3.7 % (ref 0.0–5.0)
HCT: 42.3 % (ref 36.0–46.0)
Hemoglobin: 14.3 g/dL (ref 12.0–15.0)
LYMPHS PCT: 23.6 % (ref 12.0–46.0)
Lymphs Abs: 1.8 10*3/uL (ref 0.7–4.0)
MCHC: 33.8 g/dL (ref 30.0–36.0)
MCV: 92.1 fl (ref 78.0–100.0)
Monocytes Absolute: 0.6 10*3/uL (ref 0.1–1.0)
Monocytes Relative: 7.9 % (ref 3.0–12.0)
NEUTROS PCT: 64.4 % (ref 43.0–77.0)
Neutro Abs: 4.8 10*3/uL (ref 1.4–7.7)
PLATELETS: 217 10*3/uL (ref 150.0–400.0)
RBC: 4.6 Mil/uL (ref 3.87–5.11)
RDW: 13.3 % (ref 11.5–15.5)
WBC: 7.5 10*3/uL (ref 4.0–10.5)

## 2014-04-06 LAB — BASIC METABOLIC PANEL
BUN: 18 mg/dL (ref 6–23)
CALCIUM: 9.5 mg/dL (ref 8.4–10.5)
CO2: 27 mEq/L (ref 19–32)
CREATININE: 1 mg/dL (ref 0.4–1.2)
Chloride: 106 mEq/L (ref 96–112)
GFR: 57.85 mL/min — ABNORMAL LOW (ref >60.00)
Glucose, Bld: 149 mg/dL — ABNORMAL HIGH (ref 70–99)
Potassium: 4.2 mEq/L (ref 3.5–5.1)
Sodium: 142 mEq/L (ref 135–145)

## 2014-04-06 LAB — URINALYSIS, ROUTINE W REFLEX MICROSCOPIC
Bilirubin Urine: NEGATIVE
Hgb urine dipstick: NEGATIVE
Ketones, ur: NEGATIVE
Leukocytes, UA: NEGATIVE
NITRITE: NEGATIVE
Total Protein, Urine: NEGATIVE
UROBILINOGEN UA: 0.2 (ref 0.0–1.0)
Urine Glucose: NEGATIVE
pH: 5.5 (ref 5.0–8.0)

## 2014-04-06 NOTE — Progress Notes (Signed)
Subjective:    Patient ID: Darlene Robbins, female    DOB: 1940-11-23, 73 y.o.   MRN: [id number]  Dizziness Pertinent negatives include no chest pain, chills, fatigue, fever, nausea or vomiting.  Been in [place] x 3 weeks - had UTI and was seen at a local walk-in clinic. Prescribed Cipro and began drinking cranberry juice. Indicated they found some blood in urine. Had some dizziness and sweats. Describes lightheadedness and the need to use a cane. Denies medication change since Cipro, reports blood sugar and blood pressures have been okay. UTI symptoms have gone away.   She reports that her dizziness was mainly occuring with any movement. Felt that this worsened with taking Cipro. She called the doctor at the urgent care but was told it was unrelated. No dizziness in the past 2-3 days. Feels that she may have been dehydrated.    Past Medical History  Diagnosis Date  . Thyroid disease   . Kidney stones     laser surgery in the past  . COPD (chronic obstructive pulmonary disease)     followed by Dr.Fleming  . Arthritis     uses celebrex  . Sleep apnea     CPAP 7?  . Abdominal pain     Resolved,MRSA and yeast likely colonization in setting of multiple antibiotics.  . Vitamin D deficiency 07-11-2010    drisdol 50,000 units 1 weekly x 12 weeks no refill active    Current Outpatient Prescriptions on File Prior to Visit  Medication Sig Dispense Refill  . ACCU-CHEK FASTCLIX LANCETS MISC 1 each by Does not apply route 2 (two) times daily.  102 each  6  . albuterol (VENTOLIN HFA) 108 (90 BASE) MCG/ACT inhaler Inhale 2 puffs into the lungs daily.  18 g  6  . amLODipine (NORVASC) 5 MG tablet Take 1 tablet (5 mg total) by mouth daily.  90 tablet  4  . Blood Glucose Calibration (ACCU-CHEK AVIVA) SOLN 1 each by In Vitro route once.  1 each  2  . buPROPion (WELLBUTRIN XL) 150 MG 24 hr tablet Take 1 tablet (150 mg total) by mouth daily.  90 tablet  3  . citalopram (CELEXA) 20 MG tablet Take 10 mg by  mouth daily.      . diclofenac sodium (VOLTAREN) 1 % GEL Apply 1 application topically 2 (two) times daily as needed.        . fish oil-omega-3 fatty acids 1000 MG capsule Take 2 g by mouth daily.        . Fluticasone-Salmeterol (ADVAIR DISKUS) 250-50 MCG/DOSE AEPB Inhale 1 puff into the lungs 2 (two) times daily.  60 each  11  . furosemide (LASIX) 20 MG tablet Take 1 tablet (20 mg total) by mouth daily as needed.  90 tablet  4  . glucose blood (ACCU-CHEK AVIVA) test strip Please test blood sugars at home 1-2 times a day.  100 each  12  . levothyroxine (SYNTHROID) 75 MCG tablet Take 1 tablet (75 mcg total) by mouth daily.  90 tablet  4  . meloxicam (MOBIC) 15 MG tablet Take 1 tablet (15 mg total) by mouth daily.  90 tablet  4  . metFORMIN (GLUCOPHAGE) 500 MG tablet Take 1 tablet (500 mg total) by mouth 2 (two) times daily with a meal.  180 tablet  3  . Multiple Vitamin (MULTIVITAMIN) tablet Take 1 tablet by mouth daily.      Marland Kitchen nystatin (MYCOSTATIN) cream Apply 1 application topically 3 (three)  times daily as needed.       Marland Kitchen oxybutynin (DITROPAN-XL) 5 MG 24 hr tablet Take 1 tablet (5 mg total) by mouth daily.  90 tablet  4  . pantoprazole (PROTONIX) 40 MG tablet Take 1 tablet (40 mg total) by mouth 2 (two) times daily.  180 tablet  3  . [DISCONTINUED] oxybutynin (DITROPAN-XL) 5 MG 24 hr tablet Take 5 mg by mouth daily.       No current facility-administered medications on file prior to visit.     Review of Systems  Constitutional: Negative for fever, chills and fatigue.  HENT: Negative for postnasal drip and sinus pressure.   Respiratory: Negative for shortness of breath.   Cardiovascular: Negative for chest pain and leg swelling.  Gastrointestinal: Negative for nausea, vomiting, diarrhea, constipation and blood in stool.  Endocrine: Negative for polydipsia, polyphagia and polyuria.  Neurological: Positive for dizziness and light-headedness.       When standing up quick - reports head feels  like it is full of fluid.   All other systems reviewed and are negative.      Objective:  BP 130/80  Pulse 74  Temp(Src) 97.7 F (36.5 C) (Oral)  Resp 14  Wt 271 lb 8 oz (123.152 kg)  SpO2 97%   Physical Exam  Constitutional: She is oriented to person, place, and time.  Morbidly obese, pleasant female in NAD  HENT:  Head: Normocephalic and atraumatic.  Cardiovascular: Normal rate, regular rhythm, normal heart sounds and intact distal pulses.  Exam reveals no gallop and no friction rub.   No murmur heard. Pulmonary/Chest: Effort normal and breath sounds normal. No respiratory distress. She has no wheezes. She has no rales.  Musculoskeletal: Normal range of motion.  Neurological: She is alert and oriented to person, place, and time.  Skin: Skin is warm and dry.  Psychiatric: She has a normal mood and affect. Her behavior is normal. Judgment and thought content normal.       Assessment & Plan:   1. Dizziness BPPV vs dehydration vs infection vs medication side effect. Her symptoms have improved. No dizziness in past 3 days. I will check a metabolic panel to assess renal function, electrolytes and if any dehydration. Labs were not checked when she was seen at urgent care in [place] so none to compare. If symptoms return she will need to rtc for further work up. - Basic metabolic panel - CBC with Differential  2. Microscopic hematuria I am check a urinalysis to evaluate for any RBC given her report of microscopic hematuria. This may have been 2/2 uti however need to confirm resolution. If still present will refer to urology. - POCT urinalysis dipstick - Urinalysis, Routine w reflex microscopic

## 2014-04-06 NOTE — Telephone Encounter (Signed)
Labs and dx?  

## 2014-04-06 NOTE — Progress Notes (Signed)
Pre visit review using our clinic review tool, if applicable. No additional management support is needed unless otherwise documented below in the visit note. 

## 2014-04-09 ENCOUNTER — Other Ambulatory Visit: Payer: Self-pay | Admitting: Internal Medicine

## 2014-04-25 DIAGNOSIS — I8390 Asymptomatic varicose veins of unspecified lower extremity: Secondary | ICD-10-CM

## 2014-04-25 HISTORY — DX: Asymptomatic varicose veins of unspecified lower extremity: I83.90

## 2014-04-26 DIAGNOSIS — K219 Gastro-esophageal reflux disease without esophagitis: Secondary | ICD-10-CM | POA: Diagnosis not present

## 2014-04-26 DIAGNOSIS — G4730 Sleep apnea, unspecified: Secondary | ICD-10-CM | POA: Diagnosis not present

## 2014-04-26 DIAGNOSIS — I38 Endocarditis, valve unspecified: Secondary | ICD-10-CM | POA: Diagnosis not present

## 2014-04-26 DIAGNOSIS — I10 Essential (primary) hypertension: Secondary | ICD-10-CM | POA: Diagnosis not present

## 2014-05-16 ENCOUNTER — Ambulatory Visit: Payer: Medicare Other | Admitting: Internal Medicine

## 2014-06-07 ENCOUNTER — Encounter: Payer: Self-pay | Admitting: *Deleted

## 2014-06-07 NOTE — Progress Notes (Signed)
Chart reviewed for DM bundle. Has appt scheduled 06/29/14 for follow up

## 2014-06-18 ENCOUNTER — Ambulatory Visit: Payer: Self-pay | Admitting: Physician Assistant

## 2014-06-18 DIAGNOSIS — K219 Gastro-esophageal reflux disease without esophagitis: Secondary | ICD-10-CM | POA: Diagnosis not present

## 2014-06-18 DIAGNOSIS — Z79899 Other long term (current) drug therapy: Secondary | ICD-10-CM | POA: Diagnosis not present

## 2014-06-18 DIAGNOSIS — G4730 Sleep apnea, unspecified: Secondary | ICD-10-CM | POA: Diagnosis not present

## 2014-06-18 DIAGNOSIS — J40 Bronchitis, not specified as acute or chronic: Secondary | ICD-10-CM | POA: Diagnosis not present

## 2014-06-18 DIAGNOSIS — R059 Cough, unspecified: Secondary | ICD-10-CM | POA: Diagnosis not present

## 2014-06-18 DIAGNOSIS — R05 Cough: Secondary | ICD-10-CM | POA: Diagnosis not present

## 2014-06-20 ENCOUNTER — Ambulatory Visit: Payer: Self-pay | Admitting: Emergency Medicine

## 2014-06-20 DIAGNOSIS — G4730 Sleep apnea, unspecified: Secondary | ICD-10-CM | POA: Diagnosis not present

## 2014-06-20 DIAGNOSIS — J9801 Acute bronchospasm: Secondary | ICD-10-CM | POA: Diagnosis not present

## 2014-06-20 DIAGNOSIS — K219 Gastro-esophageal reflux disease without esophagitis: Secondary | ICD-10-CM | POA: Diagnosis not present

## 2014-06-20 DIAGNOSIS — J45909 Unspecified asthma, uncomplicated: Secondary | ICD-10-CM | POA: Diagnosis not present

## 2014-06-20 DIAGNOSIS — Z79899 Other long term (current) drug therapy: Secondary | ICD-10-CM | POA: Diagnosis not present

## 2014-06-20 DIAGNOSIS — E669 Obesity, unspecified: Secondary | ICD-10-CM | POA: Diagnosis not present

## 2014-06-22 ENCOUNTER — Ambulatory Visit (INDEPENDENT_AMBULATORY_CARE_PROVIDER_SITE_OTHER): Payer: Medicare Other | Admitting: Internal Medicine

## 2014-06-22 ENCOUNTER — Encounter: Payer: Self-pay | Admitting: Internal Medicine

## 2014-06-22 VITALS — BP 132/68 | HR 82 | Temp 98.1°F | Ht 67.0 in | Wt 273.0 lb

## 2014-06-22 DIAGNOSIS — J209 Acute bronchitis, unspecified: Secondary | ICD-10-CM | POA: Insufficient documentation

## 2014-06-22 DIAGNOSIS — R0609 Other forms of dyspnea: Secondary | ICD-10-CM

## 2014-06-22 DIAGNOSIS — J45909 Unspecified asthma, uncomplicated: Secondary | ICD-10-CM | POA: Diagnosis not present

## 2014-06-22 DIAGNOSIS — R059 Cough, unspecified: Secondary | ICD-10-CM

## 2014-06-22 DIAGNOSIS — R0600 Dyspnea, unspecified: Secondary | ICD-10-CM

## 2014-06-22 DIAGNOSIS — R0989 Other specified symptoms and signs involving the circulatory and respiratory systems: Secondary | ICD-10-CM

## 2014-06-22 DIAGNOSIS — R05 Cough: Secondary | ICD-10-CM

## 2014-06-22 NOTE — Assessment & Plan Note (Signed)
Multifactorial at this time: Acute bronchitis, asthma exacerbation, cough. Albuterol nebulizers in the office x3, moderate improvement in dyspnea. Use albuterol inhaler every 4 hours for the next 4 days and then as needed only for shortness of breath and wheezing. Continue with antibiotics, steroids, cough suppressant (Tessalon Perles).

## 2014-06-22 NOTE — Patient Instructions (Signed)
Return in 1 month

## 2014-06-22 NOTE — Assessment & Plan Note (Signed)
Seen in urgent care twice. Currently on day 5 of 10 of Levaquin Currently on a prednisone taper, starting at 60 mg, on day 5 right now being tapered by 10 every 2 days. Albuterol and nebulizers in the office x3, reexamine the patient showed that she had moderate improvement and alleviation of cough and headache. Patient advised his symptoms continue to worsen she should go to the emergency room. The daughter in the room and agrees with assessment and plan.

## 2014-06-22 NOTE — Progress Notes (Signed)
MRN# [id number] Darlene Robbins Feb 12, 1941 PCP:Dr.   [facility] Fellow: @MECRED @      Attending:  Dr.    Laurel Dimmer Complaint  Patient presents with  . Acute Visit    [facility] pt here for acute bronchitis.  Went to Urgent Care on Sunday, was placed on Levaquin, Tessalon Pearles, Prednisone taper.  Pt c/o wheezing, SOB with exertion, hoarseness, nonprod cough.         Events since last clinic visit:  The patient presents today for an acute care visit and worsening symptoms of acute bronchitis. Accompanied today by her daughter. Patient states on Thursday of last week she started having a dry cough, on Sunday the cough got worse and she was started to experience shortness of breath, she went to the urgent care on Sunday, had a chest x-ray done (which did not show any acute process or infiltrate), at that time she was diagnosed with acute bronchitis and was given a ten-day course of Levaquin and a 12 day course of steroid taper starting at 60 mg. On Tuesday she went back to urgent care with increasing cough and shortness of breath, at that time she was given albuterol nebulizer treatment advised to continue with prednisone and Levaquin; she states after albuterol treatment she did have some mild improvement in her shortness of breath and cough. last night her cough and sinus congestion became acutely worse, again her cough is nonproductive.  Last night she stated that she had moderate amount of wheezing and had chest tightness. However, at today's visit she states her breathing is mildly improved but she still has sinus congestion and headache. She does admit to hot flashes but denies any chills, vomiting, nausea and diarrhea. She denies any recent sick contacts.   PMHX:   Past Medical History  Diagnosis Date  . Thyroid disease   . Kidney stones     laser surgery in the past  . COPD (chronic obstructive pulmonary disease)     followed by Dr.Fleming  . Arthritis     uses celebrex  . Sleep apnea    CPAP 7?  . Abdominal pain     Resolved,MRSA and yeast likely colonization in setting of multiple antibiotics.  . Vitamin D deficiency 07-11-2010    drisdol 50,000 units 1 weekly x 12 weeks no refill active   Surgical Hx:  Past Surgical History  Procedure Laterality Date  . Straighten nasal septum      Dr.McQueen  . Laser surgery for kidney stones      followed by Dr.Humphries  . Vein surgery      Dr.Schnier   Family Hx:  No family history on file. Social Hx:   History  Substance Use Topics  . Smoking status: Never Smoker   . Smokeless tobacco: Never Used  . Alcohol Use: No   Medication:   Current Outpatient Rx  Name  Route  Sig  Dispense  Refill  . ACCU-CHEK FASTCLIX LANCETS MISC   Does not apply   1 each by Does not apply route 2 (two) times daily.   102 each   6   . albuterol (VENTOLIN HFA) 108 (90 BASE) MCG/ACT inhaler   Inhalation   Inhale 2 puffs into the lungs daily.   18 g   6   . amLODipine (NORVASC) 5 MG tablet      TAKE 1 TABLET EVERY DAY   90 tablet   1   . benzonatate (TESSALON) 200 MG capsule   Oral  Take 200 mg by mouth 3 (three) times daily as needed for cough.         . Blood Glucose Calibration (ACCU-CHEK AVIVA) SOLN   In Vitro   1 each by In Vitro route once.   1 each   2   . buPROPion (WELLBUTRIN XL) 150 MG 24 hr tablet   Oral   Take 1 tablet (150 mg total) by mouth daily.   90 tablet   3   . citalopram (CELEXA) 20 MG tablet   Oral   Take 10 mg by mouth daily.         . diclofenac sodium (VOLTAREN) 1 % GEL   Topical   Apply 1 application topically 2 (two) times daily as needed.           . fish oil-omega-3 fatty acids 1000 MG capsule   Oral   Take 2 g by mouth daily.           . Fluticasone-Salmeterol (ADVAIR DISKUS) 250-50 MCG/DOSE AEPB   Inhalation   Inhale 1 puff into the lungs 2 (two) times daily.   60 each   11   . furosemide (LASIX) 20 MG tablet   Oral   Take 1 tablet (20 mg total) by mouth daily as  needed.   90 tablet   4   . glucose blood (ACCU-CHEK AVIVA) test strip      Please test blood sugars at home 1-2 times a day.   100 each   12     Dx code 250.0   . levofloxacin (LEVAQUIN) 500 MG tablet   Oral   Take 500 mg by mouth daily.         Marland Kitchen levothyroxine (SYNTHROID, LEVOTHROID) 75 MCG tablet      TAKE 1 TABLET EVERY DAY   90 tablet   3   . meloxicam (MOBIC) 15 MG tablet   Oral   Take 1 tablet (15 mg total) by mouth daily.   90 tablet   4   . metFORMIN (GLUCOPHAGE) 500 MG tablet   Oral   Take 1 tablet (500 mg total) by mouth 2 (two) times daily with a meal.   180 tablet   3   . Multiple Vitamin (MULTIVITAMIN) tablet   Oral   Take 1 tablet by mouth daily.         Marland Kitchen nystatin (MYCOSTATIN) cream   Topical   Apply 1 application topically 3 (three) times daily as needed.          Marland Kitchen oxybutynin (DITROPAN-XL) 5 MG 24 hr tablet   Oral   Take 1 tablet (5 mg total) by mouth daily.   90 tablet   4   . pantoprazole (PROTONIX) 40 MG tablet   Oral   Take 1 tablet (40 mg total) by mouth 2 (two) times daily.   180 tablet   3   . predniSONE (DELTASONE) 10 MG tablet   Oral   Take 10 mg by mouth. Taper pack- take as directed            Review of Systems: Gen:  Denies  fever, sweats, chills HEENT: Denies blurred vision, double vision, ear pain, eye pain, hearing loss, nose bleeds, sore throat. Admits to headache Cvc:  No dizziness, chest pain or heaviness Resp:   Admits to cough (nonproductive), shortness of breath and wheezing Gi: Denies swallowing difficulty, stomach pain, nausea or vomiting, diarrhea, constipation, bowel incontinence Gu:  Denies bladder incontinence, burning urine  Ext:   No Joint pain, stiffness or swelling Skin: No skin rash, easy bruising or bleeding or hives Endoc:  No polyuria, polydipsia , polyphagia or weight change Psych: No depression, insomnia or hallucinations  Other:  All other systems negative  Allergies:  Codeine;  Penicillins; and Sulfa antibiotics  Physical Examination:  VS: BP 132/68  Pulse 82  Temp(Src) 98.1 F (36.7 C) (Oral)  Ht 5\' 7"  (1.702 m)  Wt 273 lb (123.832 kg)  BMI 42.75 kg/m2  SpO2 97%  General Appearance: No distress  Neuro: EXAM: without focal findings, mental status, speech normal, alert and oriented, cranial nerves 2-12 grossly normal  HEENT: PERRLA, EOM intact, no ptosis, no other lesions noticed Pulmonary:Exam: Decreased breath sounds at the bases, moderate expiratory wheezing. After nebulizer treatment: Improved air movement, still wheezing but improved. Cardiovascular:@ Exam:  Normal S1,S2.  No m/r/g.     Abdomen:Exam: Benign, Soft, non-tender, No masses  Skin:   warm, no rashes, no ecchymosis  Extremities: normal, no cyanosis, clubbing, no edema, warm with normal capillary refill.   Labs results:       Rad results:   Chest x-ray 06/18/2014 The heart is enlarged. No pulmonary edema. There is prominence of the interstitial markings throughout the lungs without focal<BR>consolidation or pleural effusion. Visualized osseous structures have a normal appearance. 1. Cardiomegaly without pulmonary edema. 2. Prominent interstitial markings in the lungs.  Assessment and Plan: Intrinsic asthma Based on her last visit her PFT showed had a airflow reversibility on her pulmonary function testing which was clinically significant. Combined with her CT chest which showed air trapping consistent with asthma Dr. Lake Bells felt confident with the diagnosis of asthma. She thinks that it may be related to a dog which she brought in to the house recently.  She has done well with the Advair, until her recent acute bronchitis episode for which she is being treated for. At this time will continue with her current plan and treat her acute bronchitis as follows  Plan: - Albuterol and nebulizers in the office x3, reexamine the patient showed that she had moderate improvement and alleviation of cough  and headache. See plan acute bronchitis  -Continue Advair twice a day of the 250/50 dose -Get the dog out of the house -After the dog is out of the house try to stop the Advair -Followup with me in 1 month -Use albuterol during this episode of acute bronchitis, then only as needed for shortness of breath and wheeze    Acute bronchitis Seen in urgent care twice. Currently on day 5 of 10 of Levaquin Currently on a prednisone taper, starting at 60 mg, on day 5 right now being tapered by 10 every 2 days. Albuterol and nebulizers in the office x3, reexamine the patient showed that she had moderate improvement and alleviation of cough and headache. Patient advised his symptoms continue to worsen she should go to the emergency room. The daughter in the room agrees with assessment and plan.  Cough Infectious etiology secondary to acute bronchitis. Continue antibiotic, steroids and Tessalon Perles.  Dyspnea Multifactorial at this time: Acute bronchitis, asthma exacerbation, cough. Albuterol nebulizers in the office x3, moderate improvement in dyspnea. Use albuterol inhaler every 4 hours for the next 4 days and then as needed only for shortness of breath and wheezing. Continue with antibiotics, steroids, cough suppressant (Tessalon Perles).  Patient has a follow up with her primary care physician Dr. Gilford Rile in one week, she can follow up with the pulmonary in  one month.  Updated Medication List Outpatient Encounter Prescriptions as of 06/22/2014  Medication Sig  . ACCU-CHEK FASTCLIX LANCETS MISC 1 each by Does not apply route 2 (two) times daily.  Marland Kitchen albuterol (VENTOLIN HFA) 108 (90 BASE) MCG/ACT inhaler Inhale 2 puffs into the lungs daily.  Marland Kitchen amLODipine (NORVASC) 5 MG tablet TAKE 1 TABLET EVERY DAY  . benzonatate (TESSALON) 200 MG capsule Take 200 mg by mouth 3 (three) times daily as needed for cough.  . Blood Glucose Calibration (ACCU-CHEK AVIVA) SOLN 1 each by In Vitro route once.  Marland Kitchen  buPROPion (WELLBUTRIN XL) 150 MG 24 hr tablet Take 1 tablet (150 mg total) by mouth daily.  . citalopram (CELEXA) 20 MG tablet Take 10 mg by mouth daily.  . diclofenac sodium (VOLTAREN) 1 % GEL Apply 1 application topically 2 (two) times daily as needed.    . fish oil-omega-3 fatty acids 1000 MG capsule Take 2 g by mouth daily.    . Fluticasone-Salmeterol (ADVAIR DISKUS) 250-50 MCG/DOSE AEPB Inhale 1 puff into the lungs 2 (two) times daily.  . furosemide (LASIX) 20 MG tablet Take 1 tablet (20 mg total) by mouth daily as needed.  Marland Kitchen glucose blood (ACCU-CHEK AVIVA) test strip Please test blood sugars at home 1-2 times a day.  . levofloxacin (LEVAQUIN) 500 MG tablet Take 500 mg by mouth daily.  Marland Kitchen levothyroxine (SYNTHROID, LEVOTHROID) 75 MCG tablet TAKE 1 TABLET EVERY DAY  . meloxicam (MOBIC) 15 MG tablet Take 1 tablet (15 mg total) by mouth daily.  . metFORMIN (GLUCOPHAGE) 500 MG tablet Take 1 tablet (500 mg total) by mouth 2 (two) times daily with a meal.  . Multiple Vitamin (MULTIVITAMIN) tablet Take 1 tablet by mouth daily.  Marland Kitchen nystatin (MYCOSTATIN) cream Apply 1 application topically 3 (three) times daily as needed.   Marland Kitchen oxybutynin (DITROPAN-XL) 5 MG 24 hr tablet Take 1 tablet (5 mg total) by mouth daily.  . pantoprazole (PROTONIX) 40 MG tablet Take 1 tablet (40 mg total) by mouth 2 (two) times daily.  . predniSONE (DELTASONE) 10 MG tablet Take 10 mg by mouth. Taper pack- take as directed     Thank  you for the visitation and for allowing  [facility] and to assist in the care of your patient. Our recommendations are noted above.  Please contact us if we can be of further service.  Vilinda Boehringer, MD [facility] Office Number: [phone]

## 2014-06-22 NOTE — Assessment & Plan Note (Signed)
Infectious etiology secondary to acute bronchitis. Continue antibiotic, steroids and Tessalon Perles.

## 2014-06-22 NOTE — Assessment & Plan Note (Addendum)
Based on her last visit her PFT showed had a airflow reversibility on her pulmonary function testing which was clinically significant. Combined with her CT chest which showed air trapping consistent with asthma Dr. Lake Bells felt confident with the diagnosis of asthma. She thinks that it may be related to a dog which she brought in to the house recently.  She has done well with the Advair, until her recent acute bronchitis episode for which she is being treated for. At this time will continue with her current plan and treat her acute bronchitis as follows  Plan: - Albuterol and nebulizers in the office x3, reexamine the patient showed that she had moderate improvement and alleviation of cough and headache. See plan acute bronchitis  -Continue Advair twice a day of the 250/50 dose -Get the dog out of the house -After the dog is out of the house try to stop the Advair -Followup with me in 1 month -Use albuterol during this episode of acute bronchitis, then only as needed for shortness of breath and wheeze

## 2014-06-29 ENCOUNTER — Ambulatory Visit (INDEPENDENT_AMBULATORY_CARE_PROVIDER_SITE_OTHER): Payer: Medicare Other | Admitting: Internal Medicine

## 2014-06-29 ENCOUNTER — Other Ambulatory Visit: Payer: Self-pay | Admitting: Internal Medicine

## 2014-06-29 ENCOUNTER — Encounter: Payer: Self-pay | Admitting: Internal Medicine

## 2014-06-29 ENCOUNTER — Ambulatory Visit: Payer: Medicare Other | Admitting: Internal Medicine

## 2014-06-29 VITALS — BP 120/62 | HR 74 | Temp 98.1°F | Resp 12 | Ht 67.0 in | Wt 277.8 lb

## 2014-06-29 DIAGNOSIS — B370 Candidal stomatitis: Secondary | ICD-10-CM | POA: Insufficient documentation

## 2014-06-29 DIAGNOSIS — I10 Essential (primary) hypertension: Secondary | ICD-10-CM

## 2014-06-29 DIAGNOSIS — E119 Type 2 diabetes mellitus without complications: Secondary | ICD-10-CM

## 2014-06-29 DIAGNOSIS — M1990 Unspecified osteoarthritis, unspecified site: Secondary | ICD-10-CM

## 2014-06-29 DIAGNOSIS — F4323 Adjustment disorder with mixed anxiety and depressed mood: Secondary | ICD-10-CM

## 2014-06-29 DIAGNOSIS — J209 Acute bronchitis, unspecified: Secondary | ICD-10-CM | POA: Diagnosis not present

## 2014-06-29 DIAGNOSIS — M129 Arthropathy, unspecified: Secondary | ICD-10-CM | POA: Diagnosis not present

## 2014-06-29 LAB — CBC WITH DIFFERENTIAL/PLATELET
BASOS PCT: 0.1 % (ref 0.0–3.0)
Basophils Absolute: 0 10*3/uL (ref 0.0–0.1)
Eosinophils Absolute: 0.1 10*3/uL (ref 0.0–0.7)
Eosinophils Relative: 0.8 % (ref 0.0–5.0)
HCT: 41.4 % (ref 36.0–46.0)
HEMOGLOBIN: 13.9 g/dL (ref 12.0–15.0)
LYMPHS ABS: 2 10*3/uL (ref 0.7–4.0)
Lymphocytes Relative: 14.1 % (ref 12.0–46.0)
MCHC: 33.5 g/dL (ref 30.0–36.0)
MCV: 93.3 fl (ref 78.0–100.0)
MONOS PCT: 5.1 % (ref 3.0–12.0)
Monocytes Absolute: 0.7 10*3/uL (ref 0.1–1.0)
NEUTROS ABS: 11.3 10*3/uL — AB (ref 1.4–7.7)
Neutrophils Relative %: 79.9 % — ABNORMAL HIGH (ref 43.0–77.0)
Platelets: 187 10*3/uL (ref 150.0–400.0)
RBC: 4.44 Mil/uL (ref 3.87–5.11)
RDW: 13.3 % (ref 11.5–15.5)
WBC: 14.1 10*3/uL — ABNORMAL HIGH (ref 4.0–10.5)

## 2014-06-29 LAB — COMPREHENSIVE METABOLIC PANEL
ALT: 37 U/L — ABNORMAL HIGH (ref 0–35)
AST: 38 U/L — ABNORMAL HIGH (ref 0–37)
Albumin: 3.8 g/dL (ref 3.5–5.2)
Alkaline Phosphatase: 96 U/L (ref 39–117)
BILIRUBIN TOTAL: 0.8 mg/dL (ref 0.2–1.2)
BUN: 18 mg/dL (ref 6–23)
CO2: 30 meq/L (ref 19–32)
CREATININE: 1 mg/dL (ref 0.4–1.2)
Calcium: 9.5 mg/dL (ref 8.4–10.5)
Chloride: 101 mEq/L (ref 96–112)
GFR: 61.34 mL/min (ref >60.00)
Glucose, Bld: 162 mg/dL — ABNORMAL HIGH (ref 70–99)
Potassium: 4.5 mEq/L (ref 3.5–5.1)
Sodium: 138 mEq/L (ref 135–145)
Total Protein: 6.3 g/dL (ref 6.0–8.3)

## 2014-06-29 LAB — LIPID PANEL
Cholesterol: 161 mg/dL (ref 0–200)
HDL: 55.3 mg/dL (ref >39.00)
LDL Cholesterol: 79 mg/dL (ref 0–99)
NONHDL: 105.7
Total CHOL/HDL Ratio: 3
Triglycerides: 132 mg/dL (ref 0.0–149.0)
VLDL: 26.4 mg/dL (ref 0.0–40.0)

## 2014-06-29 LAB — HEMOGLOBIN A1C: HEMOGLOBIN A1C: 6.8 % — AB (ref 4.6–6.5)

## 2014-06-29 MED ORDER — MELOXICAM 15 MG PO TABS: 15.0000 mg | ORAL_TABLET | Freq: Every day | ORAL | Status: DC

## 2014-06-29 MED ORDER — BUPROPION HCL ER (XL) 150 MG PO TB24: 150.0000 mg | ORAL_TABLET | Freq: Every day | ORAL | Status: DC

## 2014-06-29 MED ORDER — NYSTATIN 100000 UNIT/ML MT SUSP: 5.0000 mL | Freq: Four times a day (QID) | OROMUCOSAL | Status: DC

## 2014-06-29 MED ORDER — OXYBUTYNIN CHLORIDE ER 5 MG PO TB24: 5.0000 mg | ORAL_TABLET | Freq: Every day | ORAL | Status: DC

## 2014-06-29 MED ORDER — GLUCOSE BLOOD VI STRP: ORAL_STRIP | Status: DC

## 2014-06-29 NOTE — Assessment & Plan Note (Signed)
Will treat with Nystatin.

## 2014-06-29 NOTE — Assessment & Plan Note (Signed)
BP Readings from Last 3 Encounters:  06/29/14 120/62  06/22/14 132/68  04/06/14 130/80   BP well controlled on current medications. Will check renal function with labs today.

## 2014-06-29 NOTE — Assessment & Plan Note (Addendum)
BG more elevated recently while on Prednisone. Will check A1c with labs today.

## 2014-06-29 NOTE — Patient Instructions (Signed)
Please follow up in 4 weeks to recheck bronchitis.  Labs today.

## 2014-06-29 NOTE — Assessment & Plan Note (Addendum)
Symptoms improving with recent course of Levaquin and Prednisone. Will continue Prednisone taper. She will cut back on albuterol to q6hprn. Follow up in 4 weeks for recheck and as scheduled with pulmonary medicine.

## 2014-06-29 NOTE — Progress Notes (Signed)
Pre visit review using our clinic review tool, if applicable. No additional management support is needed unless otherwise documented below in the visit note. 

## 2014-06-29 NOTE — Progress Notes (Signed)
Subjective:    Patient ID: Darlene Robbins, female    DOB: 1941-05-08, 73 y.o.   MRN: [id number]  HPI 73YO female presents for follow up.  Seen at urgent care twice last week for cough and dyspnea. Seen by Dr. Stevenson Clinch in pulmonary medicine 8/13 for acute bronchitis. Treated with prednisone taper and Levaquin. Continues to feel fatigued. No fever. Notes that tongue feels "burned." Continues to use Albuterol every 4 hours. Cough is slightly better compared to previous. No current dyspnea. No fever or chills.  DM - BG 120-270s while on prednisone. Compliant with medications.   Review of Systems  Constitutional: Positive for fatigue. Negative for fever, chills, appetite change and unexpected weight change.  HENT: Positive for sore throat. Negative for congestion, postnasal drip and trouble swallowing.   Eyes: Negative for visual disturbance.  Respiratory: Positive for cough and shortness of breath. Negative for wheezing.   Cardiovascular: Negative for chest pain and leg swelling.  Gastrointestinal: Negative for vomiting, abdominal pain, diarrhea and constipation.  Skin: Negative for color change and rash.  Hematological: Negative for adenopathy. Does not bruise/bleed easily.  Psychiatric/Behavioral: Negative for dysphoric mood. The patient is not nervous/anxious.        Objective:    BP 120/62  Pulse 74  Temp(Src) 98.1 F (36.7 C) (Oral)  Resp 12  Ht 5\' 7"  (1.702 m)  Wt 277 lb 12 oz (125.987 kg)  BMI 43.49 kg/m2  SpO2 98% Physical Exam  Constitutional: She is oriented to person, place, and time. She appears well-developed and well-nourished. No distress.  HENT:  Head: Normocephalic and atraumatic.  Right Ear: External ear normal.  Left Ear: External ear normal.  Nose: Nose normal.  Mouth/Throat: Posterior oropharyngeal erythema (white patchy exudate over tongue and throat) present. No oropharyngeal exudate.  Eyes: Conjunctivae are normal. Pupils are equal, round, and  reactive to light. Right eye exhibits no discharge. Left eye exhibits no discharge. No scleral icterus.  Neck: Normal range of motion. Neck supple. No tracheal deviation present. No thyromegaly present.  Cardiovascular: Normal rate, regular rhythm, normal heart sounds and intact distal pulses.  Exam reveals no gallop and no friction rub.   No murmur heard. Pulmonary/Chest: Effort normal and breath sounds normal. No accessory muscle usage. Not tachypneic. No respiratory distress. She has no decreased breath sounds. She has no wheezes. She has no rhonchi. She has no rales. She exhibits no tenderness.  Musculoskeletal: Normal range of motion. She exhibits no edema and no tenderness.  Lymphadenopathy:    She has no cervical adenopathy.  Neurological: She is alert and oriented to person, place, and time. No cranial nerve deficit. She exhibits normal muscle tone. Coordination normal.  Skin: Skin is warm and dry. No rash noted. She is not diaphoretic. No erythema. No pallor.  Psychiatric: She has a normal mood and affect. Her behavior is normal. Judgment and thought content normal.          Assessment & Plan:   Problem List Items Addressed This Visit     High   Type II or unspecified type diabetes mellitus without mention of complication, not stated as uncontrolled - Primary     BG more elevated recently while on Prednisone. Will check A1c with labs today.     Relevant Medications      glucose blood (ACCU-CHEK AVIVA) test strip   Other Relevant Orders      Comprehensive metabolic panel (Completed)      Hemoglobin A1c (Completed)  Lipid panel (Completed)      Microalbumin / creatinine urine ratio     Unprioritized   Acute bronchitis     Symptoms improving with recent course of Levaquin and Prednisone. Will continue Prednisone taper. She will cut back on albuterol to q6hprn. Follow up in 4 weeks for recheck and as scheduled with pulmonary medicine.    Relevant Orders      CBC with  Differential (Completed)   Adjustment disorder with mixed anxiety and depressed mood   Relevant Medications      buPROPion (WELLBUTRIN XL) 24 hr tablet   Arthritis   Relevant Medications      meloxicam (MOBIC) tablet   Hypertension      BP Readings from Last 3 Encounters:  06/29/14 120/62  06/22/14 132/68  04/06/14 130/80   BP well controlled on current medications. Will check renal function with labs today.    Thrush     Will treat with Nystatin.    Relevant Medications      nystatin (MYCOSTATIN) 100000 UNIT/ML suspension       Return in about 4 weeks (around 07/27/2014) for Recheck.

## 2014-07-04 ENCOUNTER — Other Ambulatory Visit: Payer: Self-pay | Admitting: *Deleted

## 2014-07-04 MED ORDER — GLUCOSE BLOOD VI STRP: ORAL_STRIP | Status: DC

## 2014-07-13 ENCOUNTER — Ambulatory Visit (INDEPENDENT_AMBULATORY_CARE_PROVIDER_SITE_OTHER): Payer: Medicare Other | Admitting: Pulmonary Disease

## 2014-07-13 ENCOUNTER — Encounter: Payer: Self-pay | Admitting: Pulmonary Disease

## 2014-07-13 VITALS — BP 128/76 | HR 83 | Ht 67.0 in | Wt 274.0 lb

## 2014-07-13 DIAGNOSIS — Z23 Encounter for immunization: Secondary | ICD-10-CM

## 2014-07-13 DIAGNOSIS — J45909 Unspecified asthma, uncomplicated: Secondary | ICD-10-CM

## 2014-07-13 DIAGNOSIS — J4520 Mild intermittent asthma, uncomplicated: Secondary | ICD-10-CM

## 2014-07-13 NOTE — Progress Notes (Signed)
Subjective:    Patient ID: Darlene Robbins, female    DOB: 1941-01-14, 74 y.o.   MRN: [id number]  Synopsis:: 73 year-old female with morbid obesity first seen by the [facility] pulmonary clinic in 2015 for shortness of breath. Pulmonary function testing showed significant airflow reversibility and a CT scan of her chest showed air trapping. All this was felt likely to be consistent with asthma. Her shortness of breath is compounded by deconditioning and obesity. She also had cough which was related to acid reflux.  HPI   07/13/2014 ROV > [place] was doing pretty poorly when she came here a couple of weeks ago.  She was coughing more, had increasing dyspnea, and wheezing.  She went to urgent care and was given Levaquin.  She got worse and ended up going back there where she was given a breathing treatment and a prednisone taper.  She saw my partner who recommended that she continue this treatment plan.  She is now doing much better.  She has been taking the Advair since my last visit with her in April.  She says that she no longer has shortness of breath.    Past Medical History  Diagnosis Date  . Thyroid disease   . Kidney stones     laser surgery in the past  . COPD (chronic obstructive pulmonary disease)     followed by Dr.Fleming  . Arthritis     uses celebrex  . Sleep apnea     CPAP 7?  . Abdominal pain     Resolved,MRSA and yeast likely colonization in setting of multiple antibiotics.  . Vitamin D deficiency 07-11-2010    drisdol 50,000 units 1 weekly x 12 weeks no refill active     Review of Systems      Objective:   Physical Exam   Filed Vitals:   07/13/14 0918  BP: 128/76  Pulse: 83  Height: 5\' 7"  (1.702 m)  Weight: 274 lb (124.286 kg)  SpO2: 100%  RA  Gen: well appearing, no acute distress HEENT: NCAT, EOMi, OP clear PULM: CTA B CV: RRR, no mgr, no JVD AB: BS+, soft, nontender, no hsm Ext: warm, no edema, no clubbing, no cyanosis        Assessment & Plan:   Intrinsic asthma This has been a stable interval for her.  She has recovered from her exacerbation of asthma. Plan:  Continue Advair She qualifies with for our [place] trial for Dupilumab, will ask our study coordinators to contact her F/u with me in 6 months    Updated Medication List Outpatient Encounter Prescriptions as of 07/13/2014  Medication Sig  . ACCU-CHEK FASTCLIX LANCETS MISC 1 each by Does not apply route 2 (two) times daily.  Marland Kitchen albuterol (VENTOLIN HFA) 108 (90 BASE) MCG/ACT inhaler Inhale 2 puffs into the lungs daily.  Marland Kitchen amLODipine (NORVASC) 5 MG tablet TAKE 1 TABLET EVERY DAY  . benzonatate (TESSALON) 200 MG capsule Take 200 mg by mouth 3 (three) times daily as needed for cough.  . Blood Glucose Calibration (ACCU-CHEK AVIVA) SOLN 1 each by In Vitro route once.  Marland Kitchen buPROPion (WELLBUTRIN XL) 150 MG 24 hr tablet Take 1 tablet (150 mg total) by mouth daily.  . diclofenac sodium (VOLTAREN) 1 % GEL Apply 1 application topically 2 (two) times daily as needed.    . fish oil-omega-3 fatty acids 1000 MG capsule Take 2 g by mouth daily.    . Fluticasone-Salmeterol (ADVAIR DISKUS) 250-50 MCG/DOSE AEPB Inhale 1  puff into the lungs 2 (two) times daily.  . furosemide (LASIX) 20 MG tablet Take 1 tablet (20 mg total) by mouth daily as needed.  Marland Kitchen glucose blood (ACCU-CHEK AVIVA PLUS) test strip TEST BLOOD SUGARS 2 TIMES DAILY  . glucose blood (ACCU-CHEK AVIVA) test strip Please test blood sugars at home 1-2 times a day.  . levothyroxine (SYNTHROID, LEVOTHROID) 75 MCG tablet TAKE 1 TABLET EVERY DAY  . meloxicam (MOBIC) 15 MG tablet Take 1 tablet (15 mg total) by mouth daily.  . metFORMIN (GLUCOPHAGE) 500 MG tablet Take 1 tablet (500 mg total) by mouth 2 (two) times daily with a meal.  . Multiple Vitamin (MULTIVITAMIN) tablet Take 1 tablet by mouth daily.  Marland Kitchen nystatin (MYCOSTATIN) 100000 UNIT/ML suspension Take 5 mLs (500,000 Units total) by mouth 4 (four) times daily.  Marland Kitchen  nystatin (MYCOSTATIN) cream Apply 1 application topically 3 (three) times daily as needed.   Marland Kitchen oxybutynin (DITROPAN-XL) 5 MG 24 hr tablet Take 1 tablet (5 mg total) by mouth daily.  . pantoprazole (PROTONIX) 40 MG tablet Take 1 tablet (40 mg total) by mouth 2 (two) times daily.  . [DISCONTINUED] predniSONE (DELTASONE) 10 MG tablet Take 10 mg by mouth. Taper pack- take as directed

## 2014-07-13 NOTE — Assessment & Plan Note (Signed)
This has been a stable interval for her.  She has recovered from her exacerbation of asthma. Plan:  Continue Advair She qualifies with for our [place] trial for Dupilumab, will ask our study coordinators to contact her F/u with me in 6 months

## 2014-07-13 NOTE — Patient Instructions (Signed)
Make sure you stay on top of the GERD lifestyle/diet modification we gave you Keep taking your Advair We will see you back in 6 months or sooner if needed

## 2014-07-19 DIAGNOSIS — H43819 Vitreous degeneration, unspecified eye: Secondary | ICD-10-CM | POA: Diagnosis not present

## 2014-07-21 ENCOUNTER — Ambulatory Visit: Payer: Medicare Other | Admitting: Internal Medicine

## 2014-07-25 ENCOUNTER — Other Ambulatory Visit: Payer: Self-pay | Admitting: Internal Medicine

## 2014-07-27 ENCOUNTER — Encounter: Payer: Self-pay | Admitting: Internal Medicine

## 2014-07-27 ENCOUNTER — Ambulatory Visit (INDEPENDENT_AMBULATORY_CARE_PROVIDER_SITE_OTHER): Payer: Medicare Other | Admitting: Internal Medicine

## 2014-07-27 VITALS — BP 126/60 | HR 83 | Temp 97.8°F | Ht 67.0 in | Wt 272.5 lb

## 2014-07-27 DIAGNOSIS — Z23 Encounter for immunization: Secondary | ICD-10-CM

## 2014-07-27 DIAGNOSIS — E119 Type 2 diabetes mellitus without complications: Secondary | ICD-10-CM

## 2014-07-27 DIAGNOSIS — I10 Essential (primary) hypertension: Secondary | ICD-10-CM | POA: Diagnosis not present

## 2014-07-27 DIAGNOSIS — Z1239 Encounter for other screening for malignant neoplasm of breast: Secondary | ICD-10-CM

## 2014-07-27 DIAGNOSIS — J45909 Unspecified asthma, uncomplicated: Secondary | ICD-10-CM | POA: Diagnosis not present

## 2014-07-27 DIAGNOSIS — J4520 Mild intermittent asthma, uncomplicated: Secondary | ICD-10-CM

## 2014-07-27 DIAGNOSIS — F4323 Adjustment disorder with mixed anxiety and depressed mood: Secondary | ICD-10-CM

## 2014-07-27 LAB — HM DIABETES FOOT EXAM: HM Diabetic Foot Exam: NORMAL

## 2014-07-27 NOTE — Assessment & Plan Note (Signed)
  BP Readings from Last 3 Encounters:  07/27/14 126/60  07/13/14 128/76  06/29/14 120/62   BP well controlled on current medications. Will continue.

## 2014-07-27 NOTE — Progress Notes (Signed)
Pre visit review using our clinic review tool, if applicable. No additional management support is needed unless otherwise documented below in the visit note. 

## 2014-07-27 NOTE — Patient Instructions (Signed)
Consider getting TdaP vaccine at the [facility] department.  Prevnar vaccine today.  Follow up in 3 months.

## 2014-07-27 NOTE — Assessment & Plan Note (Signed)
Lab Results  Component Value Date   HGBA1C 6.8* 06/29/2014   BG well controlled. Continue current medications. Foot exam normal today.

## 2014-07-27 NOTE — Addendum Note (Signed)
Addended by: Vernetta Honey on: 07/27/2014 09:26 AM   Modules accepted: Orders

## 2014-07-27 NOTE — Progress Notes (Signed)
Subjective:    Patient ID: Darlene Robbins, female    DOB: 01-Dec-1940, 73 y.o.   MRN: [id number]  HPI 73YO female presents for follow up.  Feeling better. Continues to have some shortness of breath with exertion and diaphoresis with exertion. This improves with rest. Feels that stamina gradually increasing.  DM - BG have been well controlled. Compliant with medications.  Would like to stop Wellbutrin. Feeling well. No anxiety or depression.  Review of Systems  Constitutional: Negative for fever, chills, appetite change, fatigue and unexpected weight change.  Eyes: Negative for visual disturbance.  Respiratory: Positive for shortness of breath. Negative for cough.   Cardiovascular: Negative for chest pain and leg swelling.  Gastrointestinal: Negative for abdominal pain.  Musculoskeletal: Negative for arthralgias and myalgias.  Skin: Negative for color change and rash.  Hematological: Negative for adenopathy. Does not bruise/bleed easily.  Psychiatric/Behavioral: Negative for dysphoric mood. The patient is not nervous/anxious.        Objective:    BP 126/60  Pulse 83  Temp(Src) 97.8 F (36.6 C) (Oral)  Ht 5\' 7"  (1.702 m)  Wt 272 lb 8 oz (123.605 kg)  BMI 42.67 kg/m2  SpO2 97% Physical Exam  Constitutional: She is oriented to person, place, and time. She appears well-developed and well-nourished. No distress.  HENT:  Head: Normocephalic and atraumatic.  Right Ear: External ear normal.  Left Ear: External ear normal.  Nose: Nose normal.  Mouth/Throat: Oropharynx is clear and moist. No oropharyngeal exudate.  Eyes: Conjunctivae are normal. Pupils are equal, round, and reactive to light. Right eye exhibits no discharge. Left eye exhibits no discharge. No scleral icterus.  Neck: Normal range of motion. Neck supple. No tracheal deviation present. No thyromegaly present.  Cardiovascular: Normal rate, regular rhythm, normal heart sounds and intact distal pulses.  Exam  reveals no gallop and no friction rub.   No murmur heard. Pulmonary/Chest: Effort normal and breath sounds normal. No accessory muscle usage. Not tachypneic. No respiratory distress. She has no decreased breath sounds. She has no wheezes. She has no rhonchi. She has no rales. She exhibits no tenderness.  Musculoskeletal: Normal range of motion. She exhibits no edema and no tenderness.  Lymphadenopathy:    She has no cervical adenopathy.  Neurological: She is alert and oriented to person, place, and time. No cranial nerve deficit. She exhibits normal muscle tone. Coordination normal.  Skin: Skin is warm and dry. No rash noted. She is not diaphoretic. No erythema. No pallor.  Psychiatric: She has a normal mood and affect. Her behavior is normal. Judgment and thought content normal.          Assessment & Plan:   Problem List Items Addressed This Visit     High   Type II or unspecified type diabetes mellitus without mention of complication, not stated as uncontrolled - Primary      Lab Results  Component Value Date   HGBA1C 6.8* 06/29/2014   BG well controlled. Continue current medications. Foot exam normal today.      Unprioritized   Adjustment disorder with mixed anxiety and depressed mood     Will stop Wellbutrin. Pt will call if she has any problems after stopping medication.    Hypertension       BP Readings from Last 3 Encounters:  07/27/14 126/60  07/13/14 128/76  06/29/14 120/62   BP well controlled on current medications. Will continue.    Intrinsic asthma     Symptoms improved after  recent exacerbation. Encouraged her to increase physical activity. Continue current medications including Advair.    Severe obesity (BMI >= 40)      Wt Readings from Last 3 Encounters:  07/27/14 272 lb 8 oz (123.605 kg)  07/13/14 274 lb (124.286 kg)  06/29/14 277 lb 12 oz (125.987 kg)   Body mass index is 42.67 kg/(m^2). Encouraged healthy diet and exercise, such as water  walking.     Other Visit Diagnoses   Screening for breast cancer        Relevant Orders       MM Digital Screening        Return in about 3 months (around 10/26/2014) for Recheck of Diabetes.

## 2014-07-27 NOTE — Assessment & Plan Note (Signed)
Symptoms improved after recent exacerbation. Encouraged her to increase physical activity. Continue current medications including Advair.

## 2014-07-27 NOTE — Assessment & Plan Note (Signed)
Will stop Wellbutrin. Pt will call if she has any problems after stopping medication.

## 2014-07-27 NOTE — Assessment & Plan Note (Signed)
Wt Readings from Last 3 Encounters:  07/27/14 272 lb 8 oz (123.605 kg)  07/13/14 274 lb (124.286 kg)  06/29/14 277 lb 12 oz (125.987 kg)   Body mass index is 42.67 kg/(m^2). Encouraged healthy diet and exercise, such as water walking.

## 2014-08-18 DIAGNOSIS — H2512 Age-related nuclear cataract, left eye: Secondary | ICD-10-CM | POA: Diagnosis not present

## 2014-08-23 ENCOUNTER — Other Ambulatory Visit: Payer: Self-pay | Admitting: Internal Medicine

## 2014-08-24 DIAGNOSIS — I83893 Varicose veins of bilateral lower extremities with other complications: Secondary | ICD-10-CM | POA: Diagnosis not present

## 2014-08-24 DIAGNOSIS — I38 Endocarditis, valve unspecified: Secondary | ICD-10-CM | POA: Diagnosis not present

## 2014-08-24 DIAGNOSIS — G4733 Obstructive sleep apnea (adult) (pediatric): Secondary | ICD-10-CM | POA: Diagnosis not present

## 2014-08-24 DIAGNOSIS — I10 Essential (primary) hypertension: Secondary | ICD-10-CM | POA: Diagnosis not present

## 2014-09-11 ENCOUNTER — Ambulatory Visit: Payer: Self-pay | Admitting: Ophthalmology

## 2014-09-11 DIAGNOSIS — Z01812 Encounter for preprocedural laboratory examination: Secondary | ICD-10-CM | POA: Diagnosis not present

## 2014-09-11 DIAGNOSIS — Z01810 Encounter for preprocedural cardiovascular examination: Secondary | ICD-10-CM

## 2014-09-11 DIAGNOSIS — I10 Essential (primary) hypertension: Secondary | ICD-10-CM

## 2014-09-11 DIAGNOSIS — H2512 Age-related nuclear cataract, left eye: Secondary | ICD-10-CM | POA: Diagnosis not present

## 2014-09-11 LAB — POTASSIUM: POTASSIUM: 4.2 mmol/L (ref 3.5–5.1)

## 2014-09-14 ENCOUNTER — Other Ambulatory Visit: Payer: Self-pay | Admitting: Internal Medicine

## 2014-09-21 ENCOUNTER — Ambulatory Visit: Payer: Self-pay | Admitting: Ophthalmology

## 2014-09-21 DIAGNOSIS — H269 Unspecified cataract: Secondary | ICD-10-CM | POA: Diagnosis not present

## 2014-09-21 DIAGNOSIS — Z9889 Other specified postprocedural states: Secondary | ICD-10-CM | POA: Diagnosis not present

## 2014-09-21 DIAGNOSIS — K219 Gastro-esophageal reflux disease without esophagitis: Secondary | ICD-10-CM | POA: Diagnosis not present

## 2014-09-21 DIAGNOSIS — I10 Essential (primary) hypertension: Secondary | ICD-10-CM | POA: Diagnosis not present

## 2014-09-21 DIAGNOSIS — M1990 Unspecified osteoarthritis, unspecified site: Secondary | ICD-10-CM | POA: Diagnosis not present

## 2014-09-21 DIAGNOSIS — Z880 Allergy status to penicillin: Secondary | ICD-10-CM | POA: Diagnosis not present

## 2014-09-21 DIAGNOSIS — Z886 Allergy status to analgesic agent status: Secondary | ICD-10-CM | POA: Diagnosis not present

## 2014-09-21 DIAGNOSIS — H2512 Age-related nuclear cataract, left eye: Secondary | ICD-10-CM | POA: Diagnosis not present

## 2014-09-21 DIAGNOSIS — Z9849 Cataract extraction status, unspecified eye: Secondary | ICD-10-CM | POA: Diagnosis not present

## 2014-09-21 DIAGNOSIS — G4730 Sleep apnea, unspecified: Secondary | ICD-10-CM | POA: Diagnosis not present

## 2014-09-21 DIAGNOSIS — Z882 Allergy status to sulfonamides status: Secondary | ICD-10-CM | POA: Diagnosis not present

## 2014-09-21 LAB — HM DIABETES EYE EXAM

## 2014-09-28 ENCOUNTER — Encounter: Payer: Self-pay | Admitting: *Deleted

## 2014-09-28 ENCOUNTER — Ambulatory Visit: Payer: Self-pay | Admitting: Internal Medicine

## 2014-09-28 DIAGNOSIS — Z1231 Encounter for screening mammogram for malignant neoplasm of breast: Secondary | ICD-10-CM | POA: Diagnosis not present

## 2014-09-28 LAB — HM MAMMOGRAPHY: HM MAMMO: NEGATIVE

## 2014-10-10 ENCOUNTER — Encounter: Payer: Self-pay | Admitting: Internal Medicine

## 2014-10-26 ENCOUNTER — Ambulatory Visit (INDEPENDENT_AMBULATORY_CARE_PROVIDER_SITE_OTHER): Payer: Medicare Other | Admitting: Internal Medicine

## 2014-10-26 ENCOUNTER — Encounter: Payer: Self-pay | Admitting: Internal Medicine

## 2014-10-26 VITALS — BP 138/83 | HR 76 | Temp 97.8°F | Ht 67.0 in | Wt 274.2 lb

## 2014-10-26 DIAGNOSIS — I10 Essential (primary) hypertension: Secondary | ICD-10-CM

## 2014-10-26 DIAGNOSIS — R0600 Dyspnea, unspecified: Secondary | ICD-10-CM | POA: Diagnosis not present

## 2014-10-26 DIAGNOSIS — E119 Type 2 diabetes mellitus without complications: Secondary | ICD-10-CM | POA: Diagnosis not present

## 2014-10-26 LAB — COMPREHENSIVE METABOLIC PANEL
ALK PHOS: 101 U/L (ref 39–117)
ALT: 35 U/L (ref 0–35)
AST: 48 U/L — ABNORMAL HIGH (ref 0–37)
Albumin: 4.4 g/dL (ref 3.5–5.2)
BUN: 17 mg/dL (ref 6–23)
CALCIUM: 9.4 mg/dL (ref 8.4–10.5)
CHLORIDE: 100 meq/L (ref 96–112)
CO2: 26 mEq/L (ref 19–32)
CREATININE: 1.3 mg/dL — AB (ref 0.4–1.2)
GFR: 42.3 mL/min — ABNORMAL LOW (ref >60.00)
Glucose, Bld: 135 mg/dL — ABNORMAL HIGH (ref 70–99)
Potassium: 3.8 mEq/L (ref 3.5–5.1)
Sodium: 138 mEq/L (ref 135–145)
Total Bilirubin: 0.6 mg/dL (ref 0.2–1.2)
Total Protein: 6.9 g/dL (ref 6.0–8.3)

## 2014-10-26 LAB — MICROALBUMIN / CREATININE URINE RATIO
CREATININE, U: 200.3 mg/dL
Microalb Creat Ratio: 0.5 mg/g (ref 0.0–30.0)
Microalb, Ur: 1 mg/dL (ref 0.0–1.9)

## 2014-10-26 LAB — HEMOGLOBIN A1C: Hgb A1c MFr Bld: 7.1 % — ABNORMAL HIGH (ref 4.6–6.5)

## 2014-10-26 MED ORDER — METFORMIN HCL 500 MG PO TABS: 500.0000 mg | ORAL_TABLET | Freq: Two times a day (BID) | ORAL | Status: DC

## 2014-10-26 MED ORDER — PANTOPRAZOLE SODIUM 40 MG PO TBEC: 40.0000 mg | DELAYED_RELEASE_TABLET | Freq: Two times a day (BID) | ORAL | Status: DC

## 2014-10-26 MED ORDER — NYSTATIN 100000 UNIT/GM EX CREA: 1.0000 "application " | TOPICAL_CREAM | Freq: Three times a day (TID) | CUTANEOUS | Status: DC | PRN

## 2014-10-26 MED ORDER — AMLODIPINE BESYLATE 5 MG PO TABS: 5.0000 mg | ORAL_TABLET | Freq: Every day | ORAL | Status: DC

## 2014-10-26 MED ORDER — LEVOTHYROXINE SODIUM 75 MCG PO TABS: 75.0000 ug | ORAL_TABLET | Freq: Every day | ORAL | Status: DC

## 2014-10-26 NOTE — Progress Notes (Signed)
Pre visit review using our clinic review tool, if applicable. No additional management support is needed unless otherwise documented below in the visit note. 

## 2014-10-26 NOTE — Patient Instructions (Signed)
Labs today.  Follow up in 3 months or sooner as needed. 

## 2014-10-26 NOTE — Assessment & Plan Note (Signed)
Chronic dyspnea with exertion. Deconditioning and morbid obesity likely playing a roll, as well as underlying asthma. Continue inhaled bronchodilators. Follow up with pulmonary as scheduled.

## 2014-10-26 NOTE — Progress Notes (Signed)
Subjective:    Patient ID: Darlene Robbins, female    DOB: October 27, 1941, 73 y.o.   MRN: [id number]  HPI  73YO female presents for follow up.  DM - BG have been higher than usual. None over 200. Compliant with medication. Notes some dietary indiscretion.  Generally feeling well. Had cataract removal in 09/2014. 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 Plans to have blepharoplasty.  Continues to have some shortness of breath with exertion such as walking up steps. Unchanged from previous. Follow up planned with pulmonary in Jan.   Past medical, surgical, family and social history per today's encounter.  Review of Systems  Constitutional: Negative for fever, chills, appetite change, fatigue and unexpected weight change.  Eyes: Negative for visual disturbance.  Respiratory: Positive for shortness of breath (with exertion). Negative for cough and wheezing.   Cardiovascular: Negative for chest pain and leg swelling.  Gastrointestinal: Negative for nausea, vomiting, abdominal pain, diarrhea and constipation.  Musculoskeletal: Negative for myalgias and arthralgias.  Skin: Negative for color change and rash.  Hematological: Negative for adenopathy. Does not bruise/bleed easily.  Psychiatric/Behavioral: Negative for sleep disturbance and dysphoric mood. The patient is not nervous/anxious.        Objective:    BP 138/83 mmHg  Pulse 76  Temp(Src) 97.8 F (36.6 C) (Oral)  Ht 5\' 7"  (1.702 m)  Wt 274 lb 4 oz (124.399 kg)  BMI 42.94 kg/m2  SpO2 95% Physical Exam  Constitutional: She is oriented to person, place, and time. She appears well-developed and well-nourished. No distress.  HENT:  Head: Normocephalic and atraumatic.  Right Ear: External ear normal.  Left Ear: External ear normal.  Nose: Nose normal.  Mouth/Throat: Oropharynx is clear and moist. No oropharyngeal exudate.  Eyes: Conjunctivae are normal. Pupils are equal, round, and reactive to light.  Right eye exhibits no discharge. Left eye exhibits no discharge. No scleral icterus.  Neck: Normal range of motion. Neck supple. No tracheal deviation present. No thyromegaly present.  Cardiovascular: Normal rate, regular rhythm, normal heart sounds and intact distal pulses.  Exam reveals no gallop and no friction rub.   No murmur heard. Pulmonary/Chest: Effort normal and breath sounds normal. No accessory muscle usage. No tachypnea. No respiratory distress. She has no decreased breath sounds. She has no wheezes. She has no rhonchi. She has no rales. She exhibits no tenderness.  Musculoskeletal: Normal range of motion. She exhibits no edema or tenderness.  Lymphadenopathy:    She has no cervical adenopathy.  Neurological: She is alert and oriented to person, place, and time. No cranial nerve deficit. She exhibits normal muscle tone. Coordination normal.  Skin: Skin is warm and dry. No rash noted. She is not diaphoretic. No erythema. No pallor.  Psychiatric: She has a normal mood and affect. Her behavior is normal. Judgment and thought content normal.          Assessment & Plan:   Problem List Items Addressed This Visit      High   Diabetes type 2, controlled - Primary    Will check A1c with labs today. Continue Metformin.    Relevant Medications      metFORMIN (GLUCOPHAGE) tablet   Other Relevant Orders      Comprehensive metabolic panel      Hemoglobin A1c      Microalbumin / creatinine urine ratio   Dyspnea    Chronic dyspnea with exertion. Deconditioning and morbid obesity likely playing a roll, as well as  underlying asthma. Continue inhaled bronchodilators. Follow up with pulmonary as scheduled.      Unprioritized   Hypertension    BP Readings from Last 3 Encounters:  10/26/14 138/83  07/27/14 126/60  07/13/14 128/76   BP well controlled on current medications. Renal function with labs today.    Relevant Medications      amLODIpine (NORVASC) tablet   Severe obesity  (BMI >= 40)    Wt Readings from Last 3 Encounters:  10/26/14 274 lb 4 oz (124.399 kg)  07/27/14 272 lb 8 oz (123.605 kg)  07/13/14 274 lb (124.286 kg)   Body mass index is 42.94 kg/(m^2). The patient is asked to make an attempt to improve diet and exercise patterns to aid in medical management of this problem.     Relevant Medications      metFORMIN (GLUCOPHAGE) tablet       Return in about 3 months (around 01/25/2015) for Wellness Visit.

## 2014-10-26 NOTE — Assessment & Plan Note (Signed)
BP Readings from Last 3 Encounters:  10/26/14 138/83  07/27/14 126/60  07/13/14 128/76   BP well controlled on current medications. Renal function with labs today.

## 2014-10-26 NOTE — Assessment & Plan Note (Signed)
Wt Readings from Last 3 Encounters:  10/26/14 274 lb 4 oz (124.399 kg)  07/27/14 272 lb 8 oz (123.605 kg)  07/13/14 274 lb (124.286 kg)   Body mass index is 42.94 kg/(m^2). The patient is asked to make an attempt to improve diet and exercise patterns to aid in medical management of this problem.

## 2014-10-26 NOTE — Assessment & Plan Note (Signed)
Will check A1c with labs today. Continue Metformin. 

## 2014-10-27 ENCOUNTER — Telehealth: Payer: Self-pay | Admitting: Internal Medicine

## 2014-10-27 NOTE — Telephone Encounter (Signed)
emmi emailed °

## 2014-10-27 NOTE — Telephone Encounter (Signed)
Christmas eve or New Years Eve 50min

## 2014-10-27 NOTE — Telephone Encounter (Signed)
Pt need follow up visit please advise where to add pt to schedule/msn

## 2014-10-27 NOTE — Telephone Encounter (Signed)
No appts except acutes next week, where do you want her added?

## 2014-10-30 ENCOUNTER — Telehealth: Payer: Self-pay | Admitting: *Deleted

## 2014-10-30 ENCOUNTER — Telehealth: Payer: Self-pay | Admitting: Internal Medicine

## 2014-10-30 NOTE — Telephone Encounter (Signed)
Ms. Winton called to cancel her appt. She said she'll have company in her home on the 24th and can't come on that day. She's under the impression that Dr. Gilford Rile will work her in sometime this week since she wants to take her off of Metformin. She said she can have an appt after her labs tomorrow or the following day. Please call the pt. She didn't want to reschedule for Dr. Thomes Dinning next available appt in mid January. Pt ph# [phone] Thank you.

## 2014-10-30 NOTE — Telephone Encounter (Signed)
Pt is coming in tomorrow What labs and dx?

## 2014-10-30 NOTE — Telephone Encounter (Signed)
Please see below response:

## 2014-10-30 NOTE — Telephone Encounter (Signed)
Repeat BMP, acute renal failure.

## 2014-10-30 NOTE — Telephone Encounter (Signed)
We don't have any other availability this week. She will need to be worked into next 43min slot in the upcoming weeks.

## 2014-10-31 ENCOUNTER — Other Ambulatory Visit: Payer: Self-pay | Admitting: *Deleted

## 2014-10-31 ENCOUNTER — Other Ambulatory Visit (INDEPENDENT_AMBULATORY_CARE_PROVIDER_SITE_OTHER): Payer: Medicare Other

## 2014-10-31 DIAGNOSIS — N179 Acute kidney failure, unspecified: Secondary | ICD-10-CM

## 2014-10-31 LAB — BASIC METABOLIC PANEL
BUN: 16 mg/dL (ref 6–23)
CO2: 25 mEq/L (ref 19–32)
Calcium: 9.4 mg/dL (ref 8.4–10.5)
Chloride: 106 mEq/L (ref 96–112)
Creatinine, Ser: 0.9 mg/dL (ref 0.4–1.2)
GFR: 66.07 mL/min (ref >60.00)
Glucose, Bld: 144 mg/dL — ABNORMAL HIGH (ref 70–99)
POTASSIUM: 4.6 meq/L (ref 3.5–5.1)
Sodium: 139 mEq/L (ref 135–145)

## 2014-11-02 ENCOUNTER — Ambulatory Visit: Payer: Medicare Other | Admitting: Internal Medicine

## 2014-11-07 ENCOUNTER — Ambulatory Visit (INDEPENDENT_AMBULATORY_CARE_PROVIDER_SITE_OTHER): Payer: Medicare Other | Admitting: Internal Medicine

## 2014-11-07 ENCOUNTER — Encounter: Payer: Self-pay | Admitting: Internal Medicine

## 2014-11-07 VITALS — BP 134/76 | HR 70 | Temp 98.4°F | Ht 67.0 in | Wt 278.2 lb

## 2014-11-07 DIAGNOSIS — I10 Essential (primary) hypertension: Secondary | ICD-10-CM | POA: Diagnosis not present

## 2014-11-07 DIAGNOSIS — E119 Type 2 diabetes mellitus without complications: Secondary | ICD-10-CM

## 2014-11-07 DIAGNOSIS — N179 Acute kidney failure, unspecified: Secondary | ICD-10-CM | POA: Insufficient documentation

## 2014-11-07 LAB — COMPREHENSIVE METABOLIC PANEL
ALBUMIN: 4.1 g/dL (ref 3.5–5.2)
ALT: 29 U/L (ref 0–35)
AST: 35 U/L (ref 0–37)
Alkaline Phosphatase: 99 U/L (ref 39–117)
BUN: 11 mg/dL (ref 6–23)
CO2: 28 mEq/L (ref 19–32)
Calcium: 9.5 mg/dL (ref 8.4–10.5)
Chloride: 106 mEq/L (ref 96–112)
Creatinine, Ser: 0.9 mg/dL (ref 0.4–1.2)
GFR: 62.03 mL/min (ref >60.00)
GLUCOSE: 147 mg/dL — AB (ref 70–99)
POTASSIUM: 4.3 meq/L (ref 3.5–5.1)
Sodium: 141 mEq/L (ref 135–145)
Total Bilirubin: 0.8 mg/dL (ref 0.2–1.2)
Total Protein: 6.6 g/dL (ref 6.0–8.3)

## 2014-11-07 NOTE — Assessment & Plan Note (Signed)
BG well controlled despite holding metformin. Repeat A1c in 3 months.

## 2014-11-07 NOTE — Progress Notes (Signed)
Pre visit review using our clinic review tool, if applicable. No additional management support is needed unless otherwise documented below in the visit note. 

## 2014-11-07 NOTE — Progress Notes (Signed)
   Subjective:    Patient ID: Darlene Robbins, female    DOB: Nov 19, 1940, 73 y.o.   MRN: [id number]  HPI 73YO female presents for follow up.  Last visit 12/17 noted to have decline in kidney function. Metformin and Meloxicam held. Repeat renal function improved. Tolerating stopping the Meloxicam well.  DM - BG running below 150. Highest BG 168.  Feeling well. No concerns today.  Past medical, surgical, family and social history per today's encounter.  Review of Systems  Constitutional: Negative for fever, chills, appetite change, fatigue and unexpected weight change.  Eyes: Negative for visual disturbance.  Respiratory: Negative for shortness of breath.   Cardiovascular: Negative for chest pain and leg swelling.  Gastrointestinal: Negative for abdominal pain, diarrhea and constipation.  Musculoskeletal: Positive for myalgias and arthralgias.  Skin: Negative for color change and rash.  Hematological: Negative for adenopathy. Does not bruise/bleed easily.  Psychiatric/Behavioral: Negative for dysphoric mood. The patient is not nervous/anxious.        Objective:    BP 134/76 mmHg  Pulse 70  Temp(Src) 98.4 F (36.9 C) (Oral)  Ht 5\' 7"  (1.702 m)  Wt 278 lb 4 oz (126.213 kg)  BMI 43.57 kg/m2  SpO2 99% Physical Exam  Constitutional: She is oriented to person, place, and time. She appears well-developed and well-nourished. No distress.  HENT:  Head: Normocephalic and atraumatic.  Right Ear: External ear normal.  Left Ear: External ear normal.  Nose: Nose normal.  Mouth/Throat: Oropharynx is clear and moist.  Eyes: Conjunctivae are normal. Pupils are equal, round, and reactive to light. Right eye exhibits no discharge. Left eye exhibits no discharge. No scleral icterus.  Neck: Normal range of motion. Neck supple. No tracheal deviation present. No thyromegaly present.  Cardiovascular: Normal rate, regular rhythm, normal heart sounds and intact distal pulses.  Exam reveals  no gallop and no friction rub.   No murmur heard. Pulmonary/Chest: Effort normal and breath sounds normal. No accessory muscle usage. No tachypnea. No respiratory distress. She has no decreased breath sounds. She has no wheezes. She has no rhonchi. She has no rales. She exhibits no tenderness.  Musculoskeletal: Normal range of motion. She exhibits no edema or tenderness.  Lymphadenopathy:    She has no cervical adenopathy.  Neurological: She is alert and oriented to person, place, and time. No cranial nerve deficit. She exhibits normal muscle tone. Coordination normal.  Skin: Skin is warm and dry. No rash noted. She is not diaphoretic. No erythema. No pallor.  Psychiatric: She has a normal mood and affect. Her behavior is normal. Judgment and thought content normal.          Assessment & Plan:   Problem List Items Addressed This Visit      High   Diabetes type 2, controlled    BG well controlled despite holding metformin. Repeat A1c in 3 months.      Unprioritized   Acute renal failure syndrome - Primary    Recent decreased GFR likely secondary to use of Meloxicam and Metformin. Will hold meds. Repeat renal function with labs today.    Relevant Orders      Comprehensive metabolic panel   Hypertension    BP Readings from Last 3 Encounters:  11/07/14 134/76  10/26/14 138/83  07/27/14 126/60   BP well controlled. Continue current medications.        Return in about 3 months (around 02/06/2015) for Recheck of Diabetes.

## 2014-11-07 NOTE — Patient Instructions (Signed)
Stop Meloxicam and Metformin.  Continue Tylenol as needed for arthritis pain. Call if symptoms not well controlled.  Monitor blood sugars 1-2 times per week.  We will repeat A1c in 3 months.

## 2014-11-07 NOTE — Assessment & Plan Note (Signed)
Recent decreased GFR likely secondary to use of Meloxicam and Metformin. Will hold meds. Repeat renal function with labs today.

## 2014-11-07 NOTE — Assessment & Plan Note (Signed)
BP Readings from Last 3 Encounters:  11/07/14 134/76  10/26/14 138/83  07/27/14 126/60   BP well controlled. Continue current medications.

## 2014-12-28 DIAGNOSIS — H02409 Unspecified ptosis of unspecified eyelid: Secondary | ICD-10-CM | POA: Diagnosis not present

## 2015-01-11 DIAGNOSIS — H02402 Unspecified ptosis of left eyelid: Secondary | ICD-10-CM | POA: Diagnosis not present

## 2015-01-25 ENCOUNTER — Ambulatory Visit (INDEPENDENT_AMBULATORY_CARE_PROVIDER_SITE_OTHER): Payer: Medicare Other | Admitting: Internal Medicine

## 2015-01-25 ENCOUNTER — Encounter: Payer: Self-pay | Admitting: Internal Medicine

## 2015-01-25 VITALS — BP 128/71 | HR 68 | Temp 97.6°F | Ht 67.0 in | Wt 267.2 lb

## 2015-01-25 DIAGNOSIS — E119 Type 2 diabetes mellitus without complications: Secondary | ICD-10-CM | POA: Diagnosis not present

## 2015-01-25 DIAGNOSIS — E039 Hypothyroidism, unspecified: Secondary | ICD-10-CM

## 2015-01-25 DIAGNOSIS — Z0000 Encounter for general adult medical examination without abnormal findings: Secondary | ICD-10-CM | POA: Diagnosis not present

## 2015-01-25 DIAGNOSIS — I10 Essential (primary) hypertension: Secondary | ICD-10-CM

## 2015-01-25 LAB — COMPREHENSIVE METABOLIC PANEL
ALK PHOS: 109 U/L (ref 39–117)
ALT: 29 U/L (ref 0–35)
AST: 39 U/L — AB (ref 0–37)
Albumin: 4.6 g/dL (ref 3.5–5.2)
BUN: 20 mg/dL (ref 6–23)
CO2: 28 mEq/L (ref 19–32)
CREATININE: 0.93 mg/dL (ref 0.40–1.20)
Calcium: 9.7 mg/dL (ref 8.4–10.5)
Chloride: 102 mEq/L (ref 96–112)
GFR: 62.76 mL/min (ref >60.00)
Glucose, Bld: 133 mg/dL — ABNORMAL HIGH (ref 70–99)
POTASSIUM: 4.2 meq/L (ref 3.5–5.1)
Sodium: 138 mEq/L (ref 135–145)
TOTAL PROTEIN: 7.1 g/dL (ref 6.0–8.3)
Total Bilirubin: 0.6 mg/dL (ref 0.2–1.2)

## 2015-01-25 LAB — HEMOGLOBIN A1C: HEMOGLOBIN A1C: 6.9 % — AB (ref 4.6–6.5)

## 2015-01-25 LAB — CBC WITH DIFFERENTIAL/PLATELET
BASOS ABS: 0.1 10*3/uL (ref 0.0–0.1)
Basophils Relative: 0.9 % (ref 0.0–3.0)
Eosinophils Absolute: 0.3 10*3/uL (ref 0.0–0.7)
Eosinophils Relative: 2.7 % (ref 0.0–5.0)
HCT: 42.1 % (ref 36.0–46.0)
Hemoglobin: 14.3 g/dL (ref 12.0–15.0)
LYMPHS ABS: 2.4 10*3/uL (ref 0.7–4.0)
LYMPHS PCT: 25.6 % (ref 12.0–46.0)
MCHC: 34 g/dL (ref 30.0–36.0)
MCV: 89.6 fl (ref 78.0–100.0)
MONOS PCT: 6 % (ref 3.0–12.0)
Monocytes Absolute: 0.6 10*3/uL (ref 0.1–1.0)
Neutro Abs: 6.2 10*3/uL (ref 1.4–7.7)
Neutrophils Relative %: 64.8 % (ref 43.0–77.0)
Platelets: 193 10*3/uL (ref 150.0–400.0)
RBC: 4.7 Mil/uL (ref 3.87–5.11)
RDW: 13.2 % (ref 11.5–15.5)
WBC: 9.5 10*3/uL (ref 4.0–10.5)

## 2015-01-25 LAB — LIPID PANEL
CHOL/HDL RATIO: 4
CHOLESTEROL: 181 mg/dL (ref 0–200)
HDL: 44.3 mg/dL (ref >39.00)
LDL Cholesterol: 109 mg/dL — ABNORMAL HIGH (ref 0–99)
NonHDL: 136.7
TRIGLYCERIDES: 141 mg/dL (ref 0.0–149.0)
VLDL: 28.2 mg/dL (ref 0.0–40.0)

## 2015-01-25 LAB — TSH: TSH: 1.84 u[IU]/mL (ref 0.35–4.50)

## 2015-01-25 LAB — MICROALBUMIN / CREATININE URINE RATIO
Creatinine,U: 84.8 mg/dL
Microalb Creat Ratio: 0.8 mg/g (ref 0.0–30.0)
Microalb, Ur: <0.7 mg/dL (ref 0.0–1.9)

## 2015-01-25 MED ORDER — GLUCOSE BLOOD VI STRP: ORAL_STRIP | Status: DC

## 2015-01-25 NOTE — Assessment & Plan Note (Signed)
Wt Readings from Last 3 Encounters:  01/25/15 267 lb 4 oz (121.224 kg)  11/07/14 278 lb 4 oz (126.213 kg)  10/26/14 274 lb 4 oz (124.399 kg)   Body mass index is 41.85 kg/(m^2). Congratulated pt on weight loss. Encouraged continued healthy diet and exercise.

## 2015-01-25 NOTE — Assessment & Plan Note (Signed)
BP Readings from Last 3 Encounters:  01/25/15 128/71  11/07/14 134/76  10/26/14 138/83   BP well controlled. Continue current medications.

## 2015-01-25 NOTE — Patient Instructions (Signed)

## 2015-01-25 NOTE — Assessment & Plan Note (Signed)
Will check TSH with labs. Continue Levothyroxine. 

## 2015-01-25 NOTE — Addendum Note (Signed)
Addended by: Ronette Deter A on: 01/25/2015 01:11 PM   Modules accepted: Level of Service, SmartSet

## 2015-01-25 NOTE — Assessment & Plan Note (Signed)
General medical exam including breast exam normal today. PAP and pelvic deferred given age and preference, all normal in past. Mammogram UTD and reviewed. Colonoscopy due this summer, and she is scheduling. Immunizations are UTD. She has living will in place and has discussed this with her son. Encouraged continued healthy diet and exercise with goal of weight loss. Labs today including CBC, CMP, lipids, A1c, TSH.

## 2015-01-25 NOTE — Progress Notes (Signed)
Subjective:    Patient ID: Darlene Robbins, female    DOB: 1941-03-02, 74 y.o.   MRN: [id number]  HPI The patient is here for annual Medicare wellness examination and management of other chronic and acute problems.   The risk factors are reflected in the social history.  The roster of all physicians providing medical care to patient - is listed in the Snapshot section of the chart.  Activities of daily living:  The patient is 100% independent in all ADLs: dressing, toileting, feeding as well as independent mobility. Lives in a home, [place], with son. Son assists with some chores such as Medical sales representative.  Home safety : The patient has smoke detectors and CO detectors in the home. They wear seatbelts.  There are firearms at home, locked. Pt just took concealed carry class. There is no violence in the home.   There is no risks for hepatitis, STDs or HIV. There is no history of blood transfusion. They have no travel history to infectious disease endemic areas of the world.  The patient has seen their dentist in the last six month. Dentist - in [place] They have seen their eye doctor in the last year. Opthalmology - Dr. Dawna Part No issues with hearing. They have deferred audiologic testing in the last year.   They do not  have excessive sun exposure. Discussed the need for sun protection: hats, long sleeves and use of sunscreen if there is significant sun exposure.  Dermatologist - Dr. Nicole Kindred Pulmonogist - Dr. Lake Bells  Diet: the importance of a healthy diet is discussed. They do have a healthy diet.  The benefits of regular aerobic exercise were discussed. She does not exercise on a regular basis, but does yardwork and housework.   Depression screen: there are no signs or vegative symptoms of depression- irritability, change in appetite, anhedonia, sadness/tearfullness. Symptoms much improved with wellbutrin.  Cognitive assessment: the patient manages all their financial and personal affairs  and is actively engaged. They could relate day,date,year and events.  HCPOA not completed, however comfortable with children making decisions, Living Will in place.  The following portions of the patient's history were reviewed and updated as appropriate: allergies, current medications, past family history, past medical history,  past surgical history, past social history  and problem list.  Visual acuity was not assessed per patient preference since she has regular follow up with her ophthalmologist. Hearing and body mass index were assessed and reviewed.   During the course of the visit the patient was educated and counseled about appropriate screening and preventive services including : fall prevention , diabetes screening, nutrition counseling, colorectal cancer screening, and recommended immunizations.     Wt Readings from Last 3 Encounters:  01/25/15 267 lb 4 oz (121.224 kg)  11/07/14 278 lb 4 oz (126.213 kg)  10/26/14 274 lb 4 oz (124.399 kg)   Trying to limit calories some for weight loss. Notes that blood sugars have been near 100-180s. Occasional 200, but this is rare.  Past medical, surgical, family and social history per today's encounter.  Review of Systems  Constitutional: Negative for fever, chills, appetite change, fatigue and unexpected weight change.  HENT: Negative for congestion.   Eyes: Negative for visual disturbance.  Respiratory: Positive for shortness of breath (with exertion). Negative for cough.   Cardiovascular: Negative for chest pain and leg swelling.  Gastrointestinal: Negative for nausea, vomiting, abdominal pain, diarrhea and constipation.  Musculoskeletal: Positive for myalgias and arthralgias.  Skin: Negative for color change and  rash.  Hematological: Negative for adenopathy. Does not bruise/bleed easily.  Psychiatric/Behavioral: Negative for suicidal ideas, sleep disturbance, dysphoric mood and decreased concentration. The patient is not  nervous/anxious.        Objective:    BP 128/71 mmHg  Pulse 68  Temp(Src) 97.6 F (36.4 C) (Oral)  Ht 5\' 7"  (1.702 m)  Wt 267 lb 4 oz (121.224 kg)  BMI 41.85 kg/m2  SpO2 96% Physical Exam  Constitutional: She is oriented to person, place, and time. She appears well-developed and well-nourished. No distress.  HENT:  Head: Normocephalic and atraumatic.  Right Ear: External ear normal.  Left Ear: External ear normal.  Nose: Nose normal.  Mouth/Throat: Oropharynx is clear and moist. No oropharyngeal exudate.  Eyes: Conjunctivae are normal. Pupils are equal, round, and reactive to light. Right eye exhibits no discharge. Left eye exhibits no discharge. No scleral icterus.  Neck: Normal range of motion. Neck supple. No tracheal deviation present. No thyromegaly present.  Cardiovascular: Normal rate, regular rhythm, normal heart sounds and intact distal pulses.  Exam reveals no gallop and no friction rub.   No murmur heard. Pulmonary/Chest: Effort normal and breath sounds normal. No accessory muscle usage. No tachypnea. No respiratory distress. She has no decreased breath sounds. She has no wheezes. She has no rales. She exhibits no tenderness. Right breast exhibits no inverted nipple, no mass, no nipple discharge, no skin change and no tenderness. Left breast exhibits no inverted nipple, no mass, no nipple discharge, no skin change and no tenderness. Breasts are symmetrical.  Abdominal: Soft. Bowel sounds are normal. She exhibits no distension and no mass. There is no tenderness. There is no rebound and no guarding.  Musculoskeletal: Normal range of motion. She exhibits no edema or tenderness.  Lymphadenopathy:    She has no cervical adenopathy.  Neurological: She is alert and oriented to person, place, and time. No cranial nerve deficit. She exhibits normal muscle tone. Coordination normal.  Skin: Skin is warm and dry. No rash noted. She is not diaphoretic. No erythema. No pallor.    Psychiatric: She has a normal mood and affect. Her behavior is normal. Judgment and thought content normal.          Assessment & Plan:   Problem List Items Addressed This Visit      High   Diabetes type 2, controlled    Will check A1c with labs. Note that we stopped Metformin given acute renal failure. Will recheck renal function, but discussed avoiding Metformin in the future. Foot exam normal today. Eye exam UTD.      Relevant Orders   Comprehensive metabolic panel   Lipid panel   Hemoglobin A1c     Unprioritized   Hypertension    BP Readings from Last 3 Encounters:  01/25/15 128/71  11/07/14 134/76  10/26/14 138/83   BP well controlled. Continue current medications.      Relevant Orders   Microalbumin / creatinine urine ratio   Hypothyroidism    Will check TSH with labs. Continue Levothyroxine.      Relevant Orders   TSH   Medicare annual wellness visit, subsequent - Primary    General medical exam including breast exam normal today. PAP and pelvic deferred given age and preference, all normal in past. Mammogram UTD and reviewed. Colonoscopy due this summer, and she is scheduling. Immunizations are UTD. She has living will in place and has discussed this with her son. Encouraged continued healthy diet and exercise with goal of  weight loss. Labs today including CBC, CMP, lipids, A1c, TSH.      Relevant Orders   CBC with Differential/Platelet   Severe obesity (BMI >= 40)    Wt Readings from Last 3 Encounters:  01/25/15 267 lb 4 oz (121.224 kg)  11/07/14 278 lb 4 oz (126.213 kg)  10/26/14 274 lb 4 oz (124.399 kg)   Body mass index is 41.85 kg/(m^2). Congratulated pt on weight loss. Encouraged continued healthy diet and exercise.          Return in about 3 months (around 04/27/2015) for Recheck of Diabetes.

## 2015-01-25 NOTE — Progress Notes (Signed)
Pre visit review using our clinic review tool, if applicable. No additional management support is needed unless otherwise documented below in the visit note. 

## 2015-01-25 NOTE — Assessment & Plan Note (Signed)
Will check A1c with labs. Note that we stopped Metformin given acute renal failure. Will recheck renal function, but discussed avoiding Metformin in the future. Foot exam normal today. Eye exam UTD.

## 2015-01-29 DIAGNOSIS — H26492 Other secondary cataract, left eye: Secondary | ICD-10-CM | POA: Diagnosis not present

## 2015-01-31 ENCOUNTER — Ambulatory Visit (INDEPENDENT_AMBULATORY_CARE_PROVIDER_SITE_OTHER): Payer: Medicare Other | Admitting: Pulmonary Disease

## 2015-01-31 ENCOUNTER — Encounter: Payer: Self-pay | Admitting: Pulmonary Disease

## 2015-01-31 VITALS — BP 128/72 | HR 74 | Ht 67.0 in | Wt 269.0 lb

## 2015-01-31 DIAGNOSIS — J4540 Moderate persistent asthma, uncomplicated: Secondary | ICD-10-CM

## 2015-01-31 DIAGNOSIS — J4520 Mild intermittent asthma, uncomplicated: Secondary | ICD-10-CM

## 2015-01-31 NOTE — Progress Notes (Signed)
Subjective:    Patient ID: Darlene Robbins, female    DOB: 07-31-1941, 74 y.o.   MRN: [id number]  Synopsis:: 74 year-old female with morbid obesity first seen by the [facility] pulmonary clinic in 2015 for shortness of breath. Pulmonary function testing showed significant airflow reversibility and a CT scan of her chest showed air trapping. All this was felt likely to be consistent with asthma. Her shortness of breath is compounded by deconditioning and obesity. She also had cough which was related to acid reflux.  HPI  Chief Complaint  Patient presents with  . Follow-up    pt c/o some sinus congestion, pnd with allergies.  pt states she is doing well at this time.      Since the trouble she had last fall with her breathing she has been doing well.  She continue to take the Advair which helps a lot.  She hasn't been back to the doctor for an asthma exacerbation.  She says taht climbing a flight of steps still give her trouble.  She has not been treated with prednisone since the last visit.  She has had scant amount of sinus congestion in the last few weeks which has worsened some.  She has struggled with this in the last few years.  She is currently not taking anything for her sinus congestion and allergies.    Past Medical History  Diagnosis Date  . Thyroid disease   . Kidney stones     laser surgery in the past  . COPD (chronic obstructive pulmonary disease)     followed by Dr.Fleming  . Arthritis     uses celebrex  . Sleep apnea     CPAP 7?  . Abdominal pain     Resolved,MRSA and yeast likely colonization in setting of multiple antibiotics.  . Vitamin D deficiency 07-11-2010    drisdol 50,000 units 1 weekly x 12 weeks no refill active     Review of Systems     Objective:   Physical Exam  Filed Vitals:   01/31/15 1018  BP: 128/72  Pulse: 74  Height: 5\' 7"  (1.702 m)  Weight: 269 lb (122.018 kg)  SpO2: 99%  RA  Gen: well appearing, no acute  distress HEENT: NCAT, EOMi, OP clear PULM: CTA B CV: RRR, no mgr, no JVD AB: BS+, soft, nontender, no hsm Ext: warm, no edema, no clubbing, no cyanosis  11/2013 Spirometry normal 03/2013 Echo> normal LVEF, mild LVH, mild LV dilatation, normal RV 12/06/2013 PFT> Ratio 66%, FEV1 1.55 L (70% pred), 14% improvement with bronchodilator, TLC 4.12L (76% pred), ERV 0.16 (16% pred), DLCO 17.0 (60% pred)     Assessment & Plan:   Asthma in adult Darlene Robbins had significant airflow obstruction which was reversible with bronchodilator. She has a history is consistent with asthma had a mild exacerbation of asthma back in the fall that she has done well with controller medications since then.   She has moderate obstructive asthma. This has been a stable interval for her. She would qualify for a clinical trial testing novel biologic agent for asthma. I have discussed this with her today and she is interested.  Plan: -Continue Advair -consider enrollment in a clinical trial for asthma     Intrinsic asthma As above     Updated Medication List Outpatient Encounter Prescriptions as of 01/31/2015  Medication Sig  . ACCU-CHEK AVIVA PLUS test strip TEST  BLOOD  SUGARS  AT  HOME  1 TO 2  TIMES DAILY  . ACCU-CHEK FASTCLIX LANCETS MISC 1 each by Does not apply route 2 (two) times daily.  Marland Kitchen albuterol (VENTOLIN HFA) 108 (90 BASE) MCG/ACT inhaler Inhale 2 puffs into the lungs daily.  Marland Kitchen amLODipine (NORVASC) 5 MG tablet Take 1 tablet (5 mg total) by mouth daily.  . Blood Glucose Calibration (ACCU-CHEK AVIVA) SOLN 1 each by In Vitro route once.  . diclofenac sodium (VOLTAREN) 1 % GEL Apply 1 application topically 2 (two) times daily as needed.    . fish oil-omega-3 fatty acids 1000 MG capsule Take 2 g by mouth daily.    . Fluticasone-Salmeterol (ADVAIR DISKUS) 250-50 MCG/DOSE AEPB Inhale 1 puff into the lungs 2 (two) times daily.  . furosemide (LASIX) 20 MG tablet Take 1 tablet (20 mg total) by mouth daily as  needed.  Marland Kitchen glucose blood (ACCU-CHEK AVIVA PLUS) test strip TEST BLOOD SUGARS 2 TIMES DAILY  . levothyroxine (SYNTHROID, LEVOTHROID) 75 MCG tablet Take 1 tablet (75 mcg total) by mouth daily.  . Multiple Vitamin (MULTIVITAMIN) tablet Take 1 tablet by mouth daily.  Marland Kitchen nystatin cream (MYCOSTATIN) Apply 1 application topically 3 (three) times daily as needed.  Marland Kitchen oxybutynin (DITROPAN-XL) 5 MG 24 hr tablet Take 1 tablet (5 mg total) by mouth daily.  . pantoprazole (PROTONIX) 40 MG tablet Take 1 tablet (40 mg total) by mouth 2 (two) times daily.

## 2015-01-31 NOTE — Assessment & Plan Note (Signed)
As above.

## 2015-01-31 NOTE — Patient Instructions (Signed)
For the sinuses:  Use Neil Med rinses with distilled water at least twice per day using the instructions on the package. 1/2 hour after using the [facility] Med rinse, use Nasacort two puffs in each nostril once per day.  Remember that the Nasacort can take 1-2 weeks to work after regular use. Use generic zyrtec (cetirizine) every day.  If this doesn't help, then stop taking it and use chlorpheniramine-phenylephrine combination tablets.  Keep taking the Advair as you are doing.  We will see you back in 6 months or sooner if needed

## 2015-01-31 NOTE — Assessment & Plan Note (Addendum)
[name] had significant airflow obstruction which was reversible with bronchodilator. She has a history is consistent with asthma had a mild exacerbation of asthma back in the fall that she has done well with controller medications since then.   She has moderate obstructive asthma. This has been a stable interval for her. She would qualify for a clinical trial testing novel biologic agent for asthma. I have discussed this with her today and she is interested.  Plan: -Continue Advair -consider enrollment in a clinical trial for asthma

## 2015-02-05 ENCOUNTER — Other Ambulatory Visit: Payer: Self-pay | Admitting: *Deleted

## 2015-02-05 MED ORDER — ONETOUCH ULTRA SYSTEM W/DEVICE KIT: 1.0000 | PACK | Freq: Once | Status: AC

## 2015-02-05 MED ORDER — GLUCOSE BLOOD VI STRP: 1.0000 | ORAL_STRIP | Status: AC | PRN

## 2015-02-05 MED ORDER — LANCETS MISC: Status: DC

## 2015-03-02 DIAGNOSIS — I10 Essential (primary) hypertension: Secondary | ICD-10-CM

## 2015-03-02 HISTORY — DX: Essential (primary) hypertension: I10

## 2015-03-03 NOTE — Op Note (Signed)
PATIENT NAME:  Darlene Robbins, Darlene Robbins MR#:  [id number] DATE OF BIRTH:  May 12, 1941  DATE OF PROCEDURE:  09/21/2014  PREOPERATIVE DIAGNOSIS:  Nuclear sclerotic cataract, left eye.  POSTOPERATIVE DIAGNOSIS:  Nuclear sclerotic cataract, left eye.  PROCEDURE:  Phacoemulsification with posterior chamber intraocular lens left eye, model SN60WF, 20.5 diopter.   SURGEON:  Lyla Glassing, MD  INDICATIONS:  This is a 74 year old female with decreased vision in the left  eye.  PROCEDURE:  The risks and benefits of cataract surgery were discussed at length with the patient, including bleeding, infection, retinal detachment, re-operation, diplopia, ptosis, loss of vision, and loss of the eye. Informed consent was obtained. On the day of surgery, several sets of preoperative medication were administered to the operative eye including 0.5% tetracaine,1% cyclopentolate, 10% phenylephrine, 0.5% ketorolac, 0.5% gatifloxacin, and 2% lidocaine .  The patient was taken to the operating room and sedated via IV sedation. Topical tetracaine was placed in the eye. The operative eye was prepped using a 10% Betadine solution and then covered in sterile drapes leaving only the operative eye exposed. A Lieberman lid speculum was placed to provide exposure. Using 0.12 forceps and a sideport blade, a paracentesis was created. Then a mixture of BSS, preservative free lidocaine, and epinephrine was injected into the anterior chamber. Next, a 2.4 mm keratome blade was used to create a two-step full-thickness clear corneal incision temporally. The cystitome and Utrata forceps were used to create a continuous capsulorrhexis in the anterior lens capsule. BSS on a hydrodissection cannula was used to perform gentle hydrodissection. Phacoemulsification was then performed to remove the nucleus. Irrigation and aspiration was performed to remove the remaining cortical material. Provisc was injected to fill the capsular bag and anterior chamber. A 20.5  diopter SN60WF intraocular lens was injected into the capsular bag. The Connor wand was used to rotate it into proper position in the capsular bag. Irrigation and aspiration was performed to remove the remaining Viscoelastic material from the eye. BSS on a 30-gauge cannula was used to hydrate the wound. An intracameral antibiotic was administered. The wounds were checked and found to be watertight. The lid speculum and drapes were carefully removed. Several drops of Vigamox were placed in the operative eye. The eye was covered with protective eyewear. The patient was taken to the recovery area in good condition. There were no complications.   ____________________________ Lyla Glassing, MD nm:bu D: 09/21/2014 13:19:17 ET T: 09/21/2014 16:38:06 ET JOB#: [id number]  cc: Lyla Glassing, MD, <Dictator> Lyla Glassing MD ELECTRONICALLY SIGNED 09/21/2014 19:46

## 2015-03-08 DIAGNOSIS — H6983 Other specified disorders of Eustachian tube, bilateral: Secondary | ICD-10-CM | POA: Diagnosis not present

## 2015-03-14 DIAGNOSIS — I38 Endocarditis, valve unspecified: Secondary | ICD-10-CM | POA: Diagnosis not present

## 2015-03-14 DIAGNOSIS — K219 Gastro-esophageal reflux disease without esophagitis: Secondary | ICD-10-CM | POA: Diagnosis not present

## 2015-03-14 DIAGNOSIS — G4733 Obstructive sleep apnea (adult) (pediatric): Secondary | ICD-10-CM | POA: Diagnosis not present

## 2015-03-14 DIAGNOSIS — I10 Essential (primary) hypertension: Secondary | ICD-10-CM | POA: Diagnosis not present

## 2015-03-29 ENCOUNTER — Ambulatory Visit
Admission: EM | Admit: 2015-03-29 | Discharge: 2015-03-29 | Disposition: A | Discharge status: Home / Self Care | Payer: Medicare Other | Attending: Family Medicine | Admitting: Family Medicine

## 2015-03-29 DIAGNOSIS — M79661 Pain in right lower leg: Secondary | ICD-10-CM

## 2015-03-29 DIAGNOSIS — M79604 Pain in right leg: Secondary | ICD-10-CM | POA: Diagnosis not present

## 2015-03-29 DIAGNOSIS — I10 Essential (primary) hypertension: Secondary | ICD-10-CM | POA: Diagnosis not present

## 2015-03-29 DIAGNOSIS — Z87442 Personal history of urinary calculi: Secondary | ICD-10-CM | POA: Diagnosis not present

## 2015-03-29 DIAGNOSIS — J449 Chronic obstructive pulmonary disease, unspecified: Secondary | ICD-10-CM | POA: Diagnosis not present

## 2015-03-29 DIAGNOSIS — M7989 Other specified soft tissue disorders: Secondary | ICD-10-CM | POA: Diagnosis not present

## 2015-03-29 NOTE — ED Provider Notes (Signed)
CSN: [id number]     Arrival date & time 03/29/15  7619 History   First MD Initiated Contact with Patient 03/29/15 [phone]     Chief Complaint  Patient presents with  . Leg Pain   (Consider location/radiation/quality/duration/timing/severity/associated sxs/prior Treatment) HPI       74 year old female presents for evaluation of right leg pain swelling and redness. This started on Monday and has got gradually worse. 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 Pain is increased with ambulation. She is concerned about a DVT. She denies any chest pain or shortness of breath and denies any fever. She has no history of DVTs. No recent travel. She is not on any hormonal medications apart from levothyroxine  Past Medical History  Diagnosis Date  . Thyroid disease   . Kidney stones     laser surgery in the past  . COPD (chronic obstructive pulmonary disease)     followed by Dr.Fleming  . Arthritis     uses celebrex  . Sleep apnea     CPAP 7?  . Abdominal pain     Resolved,MRSA and yeast likely colonization in setting of multiple antibiotics.  . Vitamin D deficiency 07-11-2010    drisdol 50,000 units 1 weekly x 12 weeks no refill active   Past Surgical History  Procedure Laterality Date  . Straighten nasal septum      Dr.McQueen  . Laser surgery for kidney stones      followed by Dr.Humphries  . Vein surgery      Dr.Schnier  . Cataract extraction Bilateral     Dr. Rosana Berger   Family History  Problem Relation Age of Onset  . Peripheral vascular disease Mother   . Heart disease Mother   . COPD Mother   . Dementia Father   . ALS Brother   . Heart disease Brother     s/p CABG   History  Substance Use Topics  . Smoking status: Never Smoker   . Smokeless tobacco: Never Used  . Alcohol Use: No   OB History    No data available     Review of Systems  Cardiovascular: Positive for leg swelling (right leg pain and  swelling).  All other systems reviewed and are negative.   Allergies  Codeine; Penicillins; and Sulfa antibiotics  Home Medications   Prior to Admission medications   Medication Sig Start Date End Date Taking? Authorizing Provider  albuterol (VENTOLIN HFA) 108 (90 BASE) MCG/ACT inhaler Inhale 2 puffs into the lungs daily. 02/13/14  Yes Juanito Doom, MD  Blood Glucose Calibration (ACCU-CHEK AVIVA) SOLN 1 each by In Vitro route once. 05/17/13  Yes [name], MD  Blood Glucose Monitoring Suppl (ONE TOUCH ULTRA SYSTEM KIT) W/DEVICE KIT 1 kit by Does not apply route once. Use as directed. Test blood sugars 1-2 times daily. 02/05/15  Yes [name], MD  diclofenac sodium (VOLTAREN) 1 % GEL Apply 1 application topically 2 (two) times daily as needed.     Yes Historical Provider, MD  fish oil-omega-3 fatty acids 1000 MG capsule Take 2 g by mouth daily.     Yes Historical Provider, MD  Fluticasone-Salmeterol (ADVAIR DISKUS) 250-50 MCG/DOSE AEPB Inhale 1 puff into the lungs 2 (two) times daily. 03/16/14 03/29/15 Yes Juanito Doom, MD  glucose blood test strip 1 each by Other route as needed for other. Use as instructed with One Touch Ultra  Glucometer. Test blood sugars at home 1-2 times daily 02/05/15  Yes [name], MD  Lancets MISC Use with One Touch Ultra Test blood sugars 1-2 times daily. 02/05/15  Yes [name], MD  levothyroxine (SYNTHROID, LEVOTHROID) 75 MCG tablet Take 1 tablet (75 mcg total) by mouth daily. 10/26/14  Yes [name], MD  Multiple Vitamin (MULTIVITAMIN) tablet Take 1 tablet by mouth daily.   Yes Historical Provider, MD  nystatin cream (MYCOSTATIN) Apply 1 application topically 3 (three) times daily as needed. 10/26/14  Yes [name], MD  omeprazole (PRILOSEC) 40 MG capsule Take 40 mg by mouth 2 (two) times daily.   Yes Historical Provider, MD  amLODipine (NORVASC) 5 MG tablet Take 1 tablet (5 mg total) by mouth daily. 10/26/14    [name], MD  furosemide (LASIX) 20 MG tablet Take 1 tablet (20 mg total) by mouth daily as needed. 09/06/12   [name], MD  oxybutynin (DITROPAN-XL) 5 MG 24 hr tablet Take 1 tablet (5 mg total) by mouth daily. 06/29/14   [name], MD  pantoprazole (PROTONIX) 40 MG tablet Take 1 tablet (40 mg total) by mouth 2 (two) times daily. 10/26/14 10/26/15  [name], MD   BP 149/74 mmHg  Pulse 72  Temp(Src) 97.8 F (36.6 C) (Tympanic)  Resp 18  Ht _0  (1.702 m)  Wt 260 lb (117.935 kg)  BMI 40.71 kg/m2  SpO2 99% Physical Exam  Constitutional: She is oriented to person, place, and time. Vital signs are normal. She appears well-developed and well-nourished. No distress.  HENT:  Head: Normocephalic and atraumatic.  Neck: Neck supple. No JVD present. No tracheal deviation present.  Cardiovascular: Normal rate, regular rhythm and normal heart sounds.   Pulses:      Dorsalis pedis pulses are 2+ on the right side, and 2+ on the left side.  Large dilated varicosities in the right lower extremity. The calf, popliteal fossa, and posterior thigh are tender to palpation. Negative Homans sign  Pulmonary/Chest: Effort normal and breath sounds normal. No respiratory distress.  Neurological: She is alert and oriented to person, place, and time. She has normal strength. Coordination normal.  Skin: Skin is warm and dry. No rash noted. She is not diaphoretic.  Psychiatric: She has a normal mood and affect. Judgment normal.  Nursing note and vitals reviewed.   ED Course  Procedures (including critical care time) Labs Review Labs Reviewed - No data to display  Imaging Review No results found.   MDM   1. Right calf pain    Concern for DVT. I offered her for [place] to arrange an outpatient venous duplex ultrasound she has elected to go to the emergency department. Her son will drive her to the emergency department       Liam Graham, PA-C 03/29/15 1051

## 2015-03-29 NOTE — ED Notes (Signed)
Patient states that she is having right leg pain with swelling and redness. States that the area started hurting on Monday. She states that she has had phlebitis in the past.

## 2015-04-04 IMAGING — MG MM DIGITAL SCREENING BILAT W/ CAD
1 series · 5 of 5 positions shown · non-contrast
Comparison: Previous exam(s).

REASON FOR EXAM: SCR MAMMO NO ORDER
COMMENTS:

PROCEDURE:     MAM - MAM DGTL SCRN MAM NO ORDER W/CAD  - July 29, 2013  [DATE]
CLINICAL DATA: Screening.
DIGITAL SCREENING BILATERAL MAMMOGRAM WITH CAD

[R CC · right · 5 of 5 slices shown]
[im 1/5]
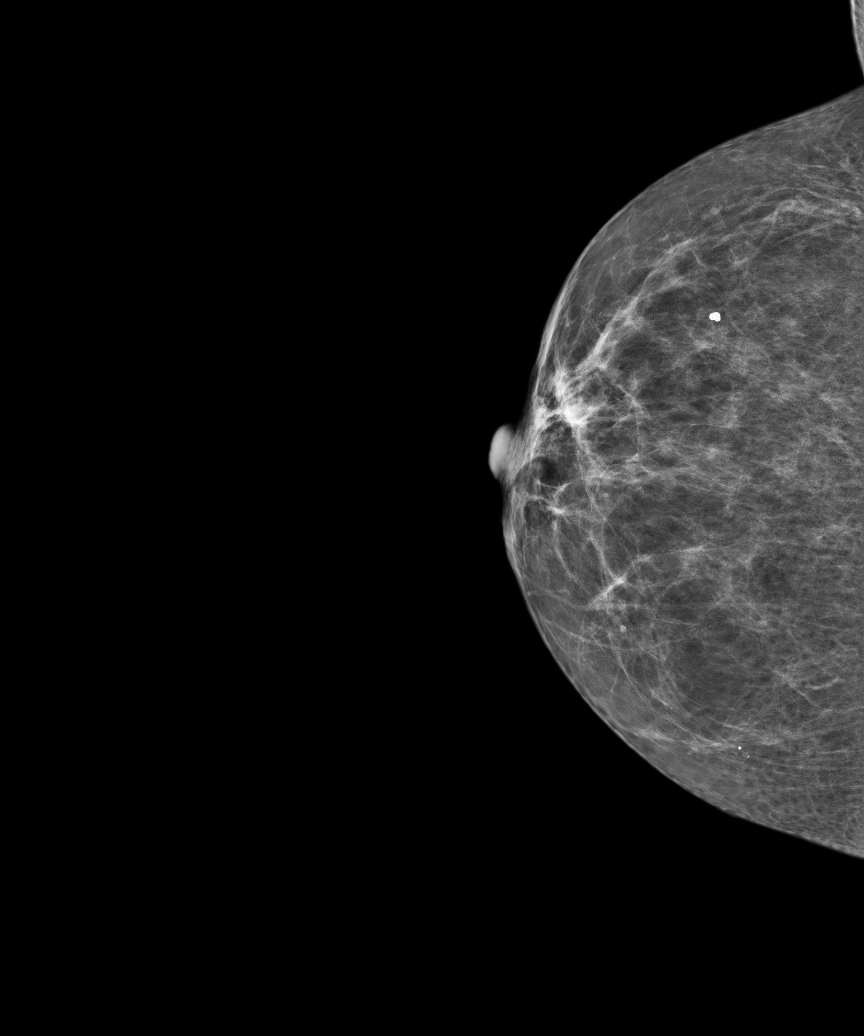
[im 2/5]
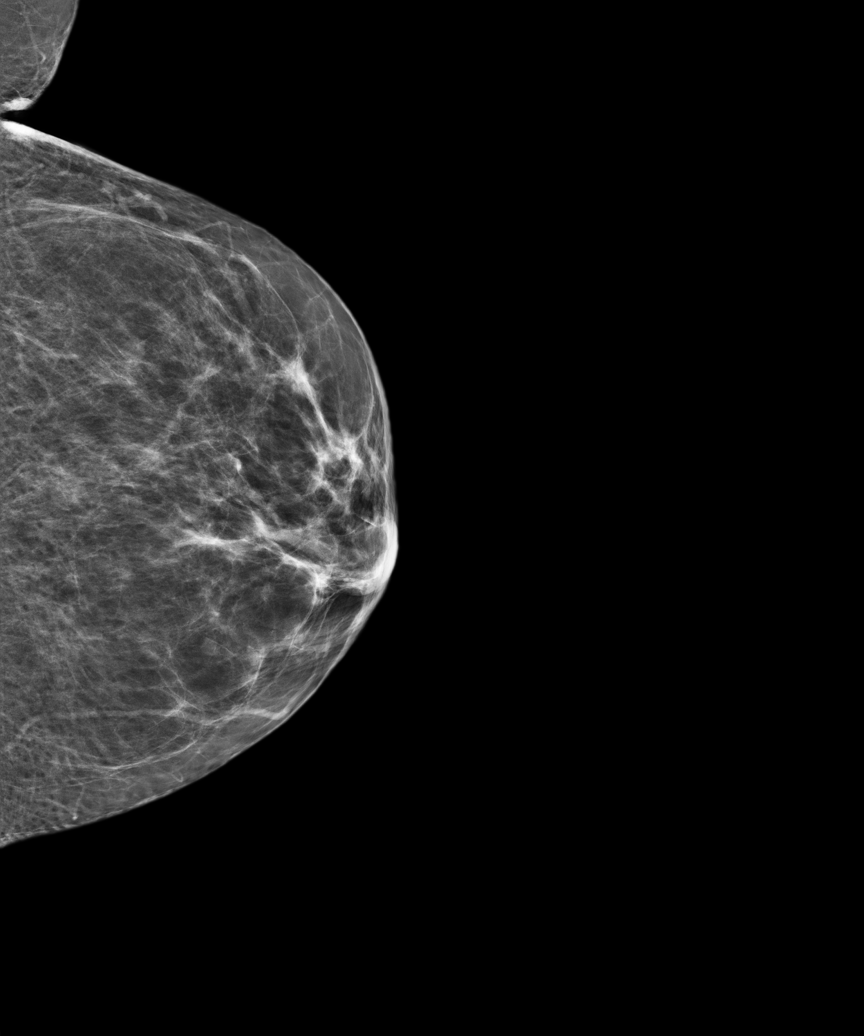
[im 3/5]
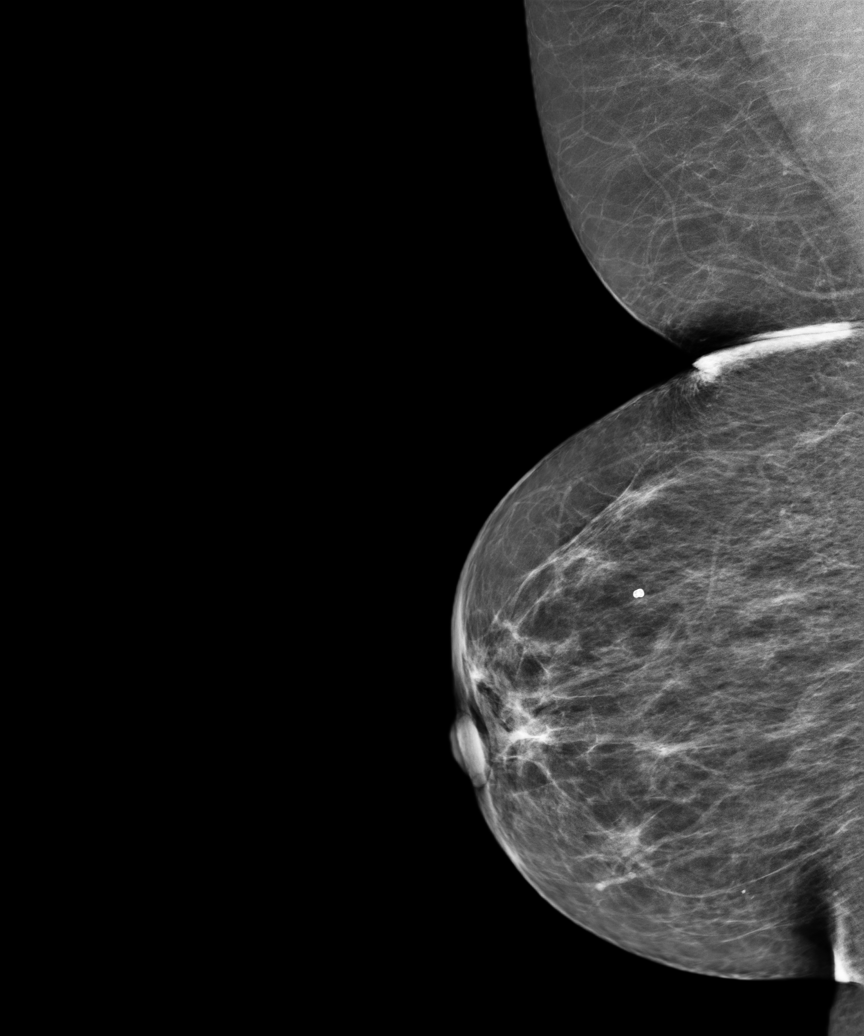
[im 4/5]
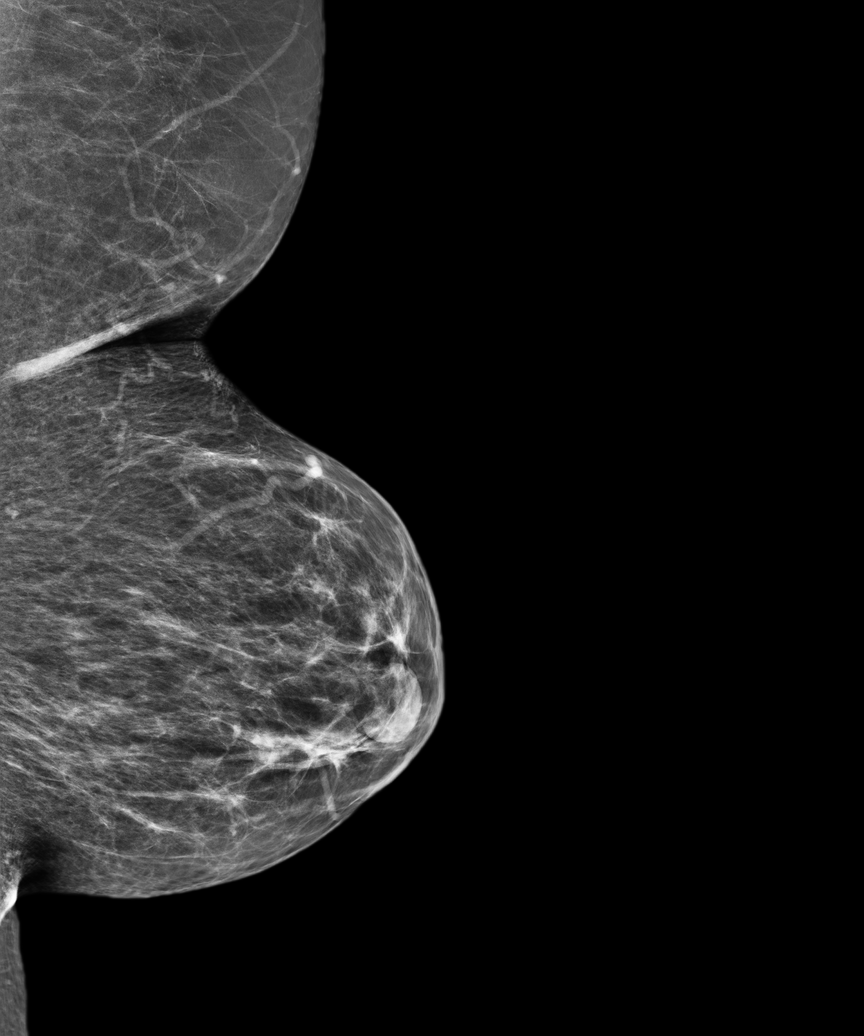
[im 5/5]
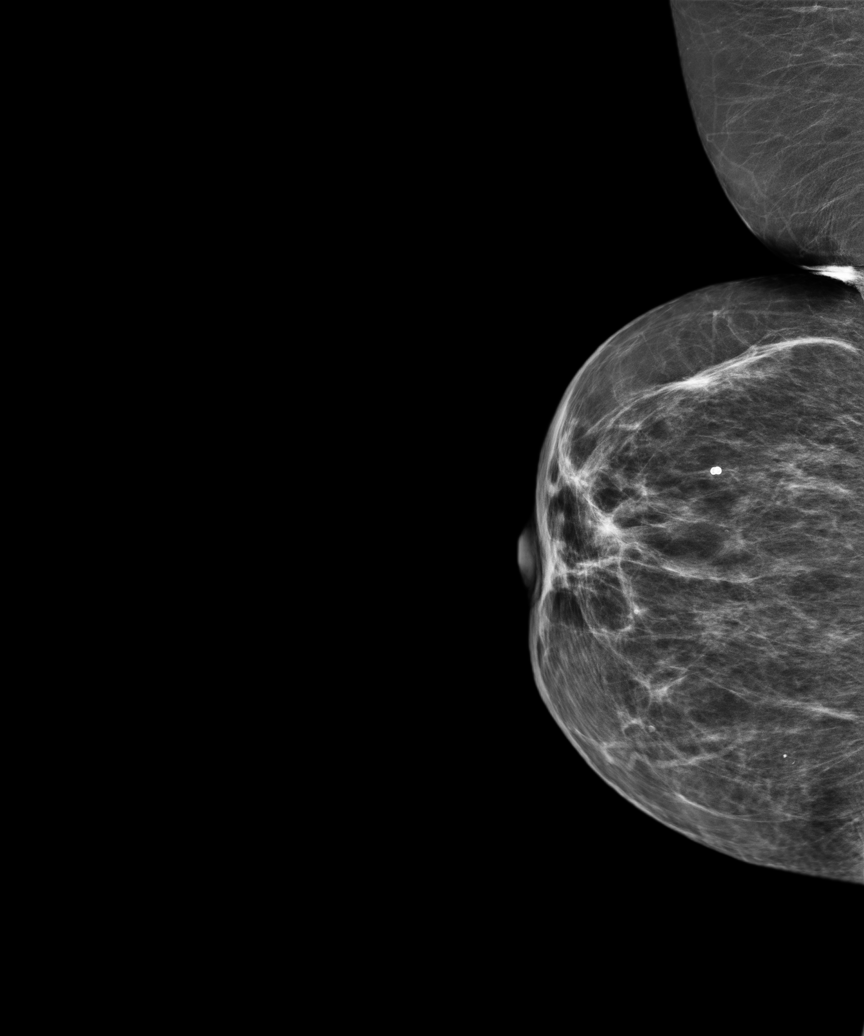

[5 of 5 positions shown; findings below may reference images not displayed]

FINDINGS: ACR Breast Density Category b:  There are scattered areas of
fibroglandular density.

There are no findings suspicious for malignancy.

Images were processed with CAD.
IMPRESSION: No mammographic evidence of malignancy.

A result letter of this screening mammogram will be mailed directly
to the patient.

RECOMMENDATION:
Screening mammogram in one year. (Code:[id number]-[id number])

BI-RADS CATEGORY 1:  Negative.

## 2015-04-26 DIAGNOSIS — H02402 Unspecified ptosis of left eyelid: Secondary | ICD-10-CM | POA: Diagnosis not present

## 2015-05-31 DIAGNOSIS — H26492 Other secondary cataract, left eye: Secondary | ICD-10-CM | POA: Diagnosis not present

## 2015-08-21 ENCOUNTER — Ambulatory Visit: Payer: Medicare Other | Admitting: Pulmonary Disease

## 2015-08-22 ENCOUNTER — Ambulatory Visit (INDEPENDENT_AMBULATORY_CARE_PROVIDER_SITE_OTHER): Payer: Medicare Other | Admitting: Internal Medicine

## 2015-08-22 ENCOUNTER — Encounter: Payer: Self-pay | Admitting: Internal Medicine

## 2015-08-22 VITALS — BP 114/68 | HR 66 | Temp 97.9°F | Ht 67.0 in | Wt 265.1 lb

## 2015-08-22 DIAGNOSIS — E118 Type 2 diabetes mellitus with unspecified complications: Secondary | ICD-10-CM | POA: Diagnosis not present

## 2015-08-22 DIAGNOSIS — J209 Acute bronchitis, unspecified: Secondary | ICD-10-CM

## 2015-08-22 DIAGNOSIS — J4540 Moderate persistent asthma, uncomplicated: Secondary | ICD-10-CM

## 2015-08-22 MED ORDER — GENTAMICIN SULFATE 0.1 % EX OINT: 1.0000 "application " | TOPICAL_OINTMENT | Freq: Three times a day (TID) | CUTANEOUS | Status: AC

## 2015-08-22 MED ORDER — PREDNISONE 10 MG PO TABS: ORAL_TABLET | ORAL | Status: DC

## 2015-08-22 MED ORDER — LEVOFLOXACIN 500 MG PO TABS: 500.0000 mg | ORAL_TABLET | Freq: Every day | ORAL | Status: DC

## 2015-08-22 MED ORDER — BENZONATATE 200 MG PO CAPS: 200.0000 mg | ORAL_CAPSULE | Freq: Two times a day (BID) | ORAL | Status: DC | PRN

## 2015-08-22 NOTE — Patient Instructions (Addendum)
Start Prednisone taper and daily Levaquin. Use Tessalon as needed for cough.  Call if symptoms are not improving.

## 2015-08-22 NOTE — Assessment & Plan Note (Signed)
Will check A1c with labs. 

## 2015-08-22 NOTE — Progress Notes (Signed)
Pre visit review using our clinic review tool, if applicable. No additional management support is needed unless otherwise documented below in the visit note. 

## 2015-08-22 NOTE — Assessment & Plan Note (Signed)
Current asthma exacerbation secondary to bronchitis. Will start Prednisone taper and Levaquin. Continue prn Albuterol. Call if symptoms are not improving. If no improvement, plan for CXR.

## 2015-08-22 NOTE — Assessment & Plan Note (Signed)
Symptoms and exam c/w acute bronchitis. Start Prednisone taper and daily Levaquin. Tessalon as needed for cough. Follow up in 4 weeks and sooner as needed.

## 2015-08-22 NOTE — Progress Notes (Signed)
Subjective:    Patient ID: Darlene Robbins, female    DOB: 1940/12/05, 74 y.o.   MRN: [id number]  HPI  74YO female presents for acute visit.  Cough - Started with cough and dyspnea last week. No fever. Chest tightness but not pain. Not taking anything for symptoms. No sick contacts. No sore throat. Using Albuterol about twice daily with some improvement.  DM - Diet controlled. BG have been up and down. Notes some dietary indiscretion with time constraints. This morning 145.  Wt Readings from Last 3 Encounters:  08/22/15 265 lb 2 oz (120.26 kg)  03/29/15 260 lb (117.935 kg)  01/31/15 269 lb (122.018 kg)   BP Readings from Last 3 Encounters:  08/22/15 114/68  03/29/15 149/74  01/31/15 128/72    Past Medical History  Diagnosis Date  . Thyroid disease   . Kidney stones     laser surgery in the past  . COPD (chronic obstructive pulmonary disease) ([place])     followed by Dr.Fleming  . Arthritis     uses celebrex  . Sleep apnea     CPAP 7?  . Abdominal pain     Resolved,MRSA and yeast likely colonization in setting of multiple antibiotics.  . Vitamin D deficiency 07-11-2010    drisdol 50,000 units 1 weekly x 12 weeks no refill active   Family History  Problem Relation Age of Onset  . Peripheral vascular disease Mother   . Heart disease Mother   . COPD Mother   . Dementia Father   . ALS Brother   . Heart disease Brother     s/p CABG   Past Surgical History  Procedure Laterality Date  . Straighten nasal septum      Dr.McQueen  . Laser surgery for kidney stones      followed by Dr.Humphries  . Vein surgery      Dr.Schnier  . Cataract extraction Bilateral     Dr. Rosana Berger   Social History   Social History  . Marital Status: Widowed    Spouse Name: N/A  . Number of Children: N/A  . Years of Education: N/A   Social History Main Topics  . Smoking status: Never Smoker   . Smokeless tobacco: Never Used  . Alcohol Use: No  . Drug Use: No  . Sexual  Activity: Not Asked   Other Topics Concern  . None   Social History Narrative   Lives with son.    Review of Systems  Constitutional: Positive for fatigue. Negative for fever, chills, appetite change and unexpected weight change.  HENT: Positive for congestion, postnasal drip and rhinorrhea. Negative for ear discharge, ear pain, facial swelling, hearing loss, mouth sores, nosebleeds, sinus pressure, sneezing, sore throat, tinnitus, trouble swallowing and voice change.   Eyes: Negative for pain, discharge, redness and visual disturbance.  Respiratory: Positive for cough, chest tightness and shortness of breath. Negative for wheezing and stridor.   Cardiovascular: Negative for chest pain, palpitations and leg swelling.  Gastrointestinal: Negative for abdominal pain, diarrhea and constipation.  Musculoskeletal: Negative for myalgias, arthralgias, neck pain and neck stiffness.  Skin: Negative for color change and rash.  Neurological: Negative for dizziness, weakness, light-headedness and headaches.  Hematological: Negative for adenopathy. Does not bruise/bleed easily.  Psychiatric/Behavioral: Negative for dysphoric mood. The patient is not nervous/anxious.        Objective:    BP 114/68 mmHg  Pulse 66  Temp(Src) 97.9 F (36.6 C) (Oral)  Ht 5\' 7"  (1.702 m)  Wt 265 lb 2 oz (120.26 kg)  BMI 41.51 kg/m2  SpO2 97% Physical Exam  Constitutional: She is oriented to person, place, and time. She appears well-developed and well-nourished. No distress.  HENT:  Head: Normocephalic and atraumatic.  Right Ear: External ear normal.  Left Ear: External ear normal.  Nose: Nose normal.  Mouth/Throat: Oropharynx is clear and moist. No oropharyngeal exudate.  Eyes: Conjunctivae are normal. Pupils are equal, round, and reactive to light. Right eye exhibits no discharge. Left eye exhibits no discharge. No scleral icterus.  Neck: Normal range of motion. Neck supple. No tracheal deviation present. No  thyromegaly present.  Cardiovascular: Normal rate, regular rhythm, normal heart sounds and intact distal pulses.  Exam reveals no gallop and no friction rub.   No murmur heard. Pulmonary/Chest: Effort normal. No accessory muscle usage. No tachypnea. No respiratory distress. She has decreased breath sounds. She has no wheezes. She has rhonchi (scattered rhonchi). She has no rales. She exhibits no tenderness.  Musculoskeletal: Normal range of motion. She exhibits no edema or tenderness.  Lymphadenopathy:    She has no cervical adenopathy.  Neurological: She is alert and oriented to person, place, and time. No cranial nerve deficit. She exhibits normal muscle tone. Coordination normal.  Skin: Skin is warm and dry. No rash noted. She is not diaphoretic. No erythema. No pallor.  Psychiatric: She has a normal mood and affect. Her behavior is normal. Judgment and thought content normal.          Assessment & Plan:   Problem List Items Addressed This Visit      High   Asthma in adult    Current asthma exacerbation secondary to bronchitis. Will start Prednisone taper and Levaquin. Continue prn Albuterol. Call if symptoms are not improving. If no improvement, plan for CXR.      Relevant Medications   predniSONE (DELTASONE) 10 MG tablet   Diabetes type 2, controlled ([place])    Will check A1c with labs.      Relevant Orders   Comprehensive metabolic panel   Lipid panel     Unprioritized   Acute bronchitis - Primary    Symptoms and exam c/w acute bronchitis. Start Prednisone taper and daily Levaquin. Tessalon as needed for cough. Follow up in 4 weeks and sooner as needed.      Relevant Medications   predniSONE (DELTASONE) 10 MG tablet   levofloxacin (LEVAQUIN) 500 MG tablet       Return in about 4 weeks (around 09/19/2015), or if symptoms worsen or fail to improve.

## 2015-08-23 ENCOUNTER — Ambulatory Visit: Payer: Medicare Other | Admitting: Internal Medicine

## 2015-08-24 ENCOUNTER — Telehealth: Payer: Self-pay

## 2015-08-24 NOTE — Telephone Encounter (Signed)
Can you ask her to come back for A1c, as blood hemolyzed?

## 2015-08-24 NOTE — Telephone Encounter (Signed)
The said "blod was clumpy" per karen   Spoke to pt she said she will come back next week

## 2015-08-24 NOTE — Telephone Encounter (Signed)
i wasn't here, but i asked the lab about 10.12.2016 labs and blood was hemolyzed, so pt will have to come back

## 2015-08-24 NOTE — Telephone Encounter (Signed)
Darlene Robbins, Dr Gilford Rile wanted to know if you could add an A1c to Darlene Robbins labs. She was seen on 10.12.16. Thank you

## 2015-08-27 ENCOUNTER — Telehealth: Payer: Self-pay | Admitting: *Deleted

## 2015-08-27 ENCOUNTER — Other Ambulatory Visit: Payer: Medicare Other

## 2015-08-27 NOTE — Telephone Encounter (Signed)
Notified patient as instructed, patient pleased °

## 2015-08-27 NOTE — Telephone Encounter (Signed)
Patient has requested a medication for her sore throat. Patient has had the sore throat for 2 days.

## 2015-08-27 NOTE — Telephone Encounter (Signed)
I would recommend Tylenol 1000mg  up to three times daily as needed for sore throat. If symptoms persistent, or if fever, chills, or other concerns, then needs to be seen in the office.

## 2015-08-28 ENCOUNTER — Other Ambulatory Visit (INDEPENDENT_AMBULATORY_CARE_PROVIDER_SITE_OTHER): Payer: Medicare Other

## 2015-08-28 DIAGNOSIS — I10 Essential (primary) hypertension: Secondary | ICD-10-CM | POA: Diagnosis not present

## 2015-08-29 DIAGNOSIS — M79661 Pain in right lower leg: Secondary | ICD-10-CM | POA: Diagnosis not present

## 2015-08-29 DIAGNOSIS — M79604 Pain in right leg: Secondary | ICD-10-CM | POA: Diagnosis not present

## 2015-08-29 DIAGNOSIS — I83891 Varicose veins of right lower extremities with other complications: Secondary | ICD-10-CM | POA: Diagnosis not present

## 2015-08-29 DIAGNOSIS — R600 Localized edema: Secondary | ICD-10-CM | POA: Diagnosis not present

## 2015-08-29 DIAGNOSIS — I83811 Varicose veins of right lower extremities with pain: Secondary | ICD-10-CM | POA: Diagnosis not present

## 2015-08-29 LAB — COMPREHENSIVE METABOLIC PANEL
ALBUMIN: 4.1 g/dL (ref 3.5–5.2)
ALK PHOS: 129 U/L — AB (ref 39–117)
ALT: 15 U/L (ref 0–35)
AST: 14 U/L (ref 0–37)
BILIRUBIN TOTAL: 0.6 mg/dL (ref 0.2–1.2)
BUN: 21 mg/dL (ref 6–23)
CO2: 28 mEq/L (ref 19–32)
Calcium: 9.4 mg/dL (ref 8.4–10.5)
Chloride: 100 mEq/L (ref 96–112)
Creatinine, Ser: 0.91 mg/dL (ref 0.40–1.20)
GFR: 64.25 mL/min (ref >60.00)
GLUCOSE: 233 mg/dL — AB (ref 70–99)
POTASSIUM: 4.4 meq/L (ref 3.5–5.1)
SODIUM: 138 meq/L (ref 135–145)
TOTAL PROTEIN: 6.7 g/dL (ref 6.0–8.3)

## 2015-08-29 LAB — LIPID PANEL
CHOLESTEROL: 170 mg/dL (ref 0–200)
HDL: 57.1 mg/dL (ref >39.00)
LDL Cholesterol: 84 mg/dL (ref 0–99)
NONHDL: 112.89
Total CHOL/HDL Ratio: 3
Triglycerides: 145 mg/dL (ref 0.0–149.0)
VLDL: 29 mg/dL (ref 0.0–40.0)

## 2015-09-05 ENCOUNTER — Encounter: Payer: Self-pay | Admitting: Family Medicine

## 2015-09-05 ENCOUNTER — Ambulatory Visit
Admission: RE | Admit: 2015-09-05 | Discharge: 2015-09-05 | Disposition: A | Discharge status: Home / Self Care | Payer: Medicare Other | Source: Ambulatory Visit | Attending: Family Medicine | Admitting: Family Medicine

## 2015-09-05 ENCOUNTER — Ambulatory Visit (INDEPENDENT_AMBULATORY_CARE_PROVIDER_SITE_OTHER): Payer: Medicare Other | Admitting: Family Medicine

## 2015-09-05 ENCOUNTER — Ambulatory Visit: Payer: Medicare Other | Admitting: Nurse Practitioner

## 2015-09-05 ENCOUNTER — Telehealth: Payer: Self-pay | Admitting: Family Medicine

## 2015-09-05 VITALS — BP 112/70 | HR 80 | Temp 98.2°F | Ht 67.0 in | Wt 268.8 lb

## 2015-09-05 DIAGNOSIS — M7989 Other specified soft tissue disorders: Secondary | ICD-10-CM

## 2015-09-05 DIAGNOSIS — I868 Varicose veins of other specified sites: Secondary | ICD-10-CM

## 2015-09-05 DIAGNOSIS — I82811 Embolism and thrombosis of superficial veins of right lower extremities: Secondary | ICD-10-CM | POA: Insufficient documentation

## 2015-09-05 DIAGNOSIS — I8390 Asymptomatic varicose veins of unspecified lower extremity: Secondary | ICD-10-CM

## 2015-09-05 DIAGNOSIS — R109 Unspecified abdominal pain: Secondary | ICD-10-CM | POA: Diagnosis not present

## 2015-09-05 DIAGNOSIS — N8110 Cystocele, unspecified: Secondary | ICD-10-CM

## 2015-09-05 DIAGNOSIS — R350 Frequency of micturition: Secondary | ICD-10-CM

## 2015-09-05 HISTORY — DX: Asymptomatic varicose veins of unspecified lower extremity: I83.90

## 2015-09-05 LAB — POCT URINALYSIS DIPSTICK
Bilirubin, UA: NEGATIVE
Blood, UA: NEGATIVE
GLUCOSE UA: NEGATIVE
Ketones, UA: NEGATIVE
LEUKOCYTES UA: NEGATIVE
NITRITE UA: NEGATIVE
Protein, UA: NEGATIVE
Spec Grav, UA: 1.025
UROBILINOGEN UA: 0.2
pH, UA: 5

## 2015-09-05 NOTE — Assessment & Plan Note (Signed)
Patient describes cramping of her abdominal wall. No other symptoms with this. She notes the muscles feel sore when this happens. She has a benign abdominal exam today. Vitals are stable today. She recently had a CMP that was negative for liver issues or electrolyte issues. This could represent muscular cramping of the abdominal wall or some intra-abdominal process. Discussed this with the patient. We'll obtain a CMP, lipase, and CBC to evaluate this further. I did discuss obtaining ultrasound or CT scan of her abdomen and pelvis to evaluate this further though she declined these at this time. She was given return precautions.

## 2015-09-05 NOTE — Progress Notes (Signed)
Patient ID: Darlene Robbins, female   DOB: [birth year], 74 y.o.   MRN: [id number]  Tommi Rumps, MD Phone: [phone]  Darlene Robbins is a 74 y.o. female who presents today for same-day visit.  Right lower extremity pain and swelling: Patient notes she was seen one week ago at the [facility] ED for this issue. She noted at that time that she had several days of increasing pain in her right lower extremity from just above her knee down to her calf with increased swelling. She noted mild erythema over the medial aspect of her lower thigh with this. She notes she had an ultrasound that was negative for blood clot and was advised to follow-up with her PCP if this did not improve. She notes the swelling is persistent. She does continue to have pain. She had a new spot of erythema this morning. She's been unable to wear her compression stockings for this due to the swelling. She does note she had a surgery for her varicose veins 8-9 years ago. She has no history of DVT or PE. She has no chest pain or trouble breathing. She is not on any hormone replacement. She denies any recent surgeries or periods of immobility.  Frequent urination: Patient notes for the last 1-2 weeks she has been urinating frequently. She notes if she stands up at night she will urinate. She notes she knows she is doing this though the urine just comes out. She has no bowel issues. She has no burning with urination or odor to her urine. She has no vaginal or suprapubic discomfort. She has no urinary urgency. She notes she has had this issue in the past though it has become more frequent recently. She has no vaginal fullness and no fevers. She's had no back pain with this. She's had no saddle anesthesia. She does note a history of diabetes and is currently not on any medication for this. She has no numbness or weakness.  Abdominal wall cramping: Patient notes since this past weekend she's felt like the muscles in her abdominal wall  are cramping. It is across the middle of her stomach. She notes no epigastric issues or right lower quadrant or right upper quadrant discomfort or left lower quadrant discomfort. She notes a cramping sensation lasts for 10-15 minutes. It occurs across the middle of her abdomen. It is not associated with anything in particular. Not associated with eating. She has had no vaginal discharge or bleeding with this. She's had no nausea, vomiting or diarrhea.  PMH: nonsmoker.   ROS see history of present illness  Objective  Physical Exam Filed Vitals:   09/05/15 1307  BP: 112/70  Pulse: 80  Temp: 98.2 F (36.8 C)    Physical Exam  Constitutional: She is well-developed, well-nourished, and in no distress.  HENT:  Head: Normocephalic and atraumatic.  Cardiovascular: Normal rate, regular rhythm and normal heart sounds.  Exam reveals no gallop and no friction rub.   No murmur heard. 2+ DP pulses  Pulmonary/Chest: Effort normal and breath sounds normal. No respiratory distress. She has no wheezes. She has no rales.  Abdominal: Soft. Bowel sounds are normal. She exhibits no distension. There is no tenderness. There is no rebound and no guarding.  Genitourinary:  Normal labia, normal vaginal mucosa, exam was technically difficult given in large amount of fatty tissue in the vaginal sidewalls that collapsed in on the speculum, unable to appreciate the cervix due to this, bimanual exam completed with no cervical motion tenderness  or adnexal tenderness or masses, there was noted on bimanual that the patient's bladder prolapsed into the vagina especially on coughing though did not prolapsed out of the vagina  Musculoskeletal:  Right lower extremity visibly larger than left lower extremity, there are a number of tortuous varicose veins noted in the right lower extremity there is a tender vein over the medial aspect of the distal thigh with mild overlying erythema that is not indurated or fluctuant, there  is no tenderness of the calf and there are no cords palpated in the calf, there is no tenderness of the posterior thigh or cords palpated in the posterior thigh Left lower extremity with minimal swelling, there are no varicose veins noted, there is no tenderness, there is no erythema, there is no calf tenderness or cords palpated  Neurological: She is alert.  5 out of 5 strength in bilateral quads, hamstrings, plantar flexion, and dorsiflexion, sensation to light touch intact in bilateral lower extremities, 2+ patellar reflex, sensation intact in the perineum  Skin: Skin is warm and dry. She is not diaphoretic.     Assessment/Plan: Please see individual problem list.  Varicose veins Patient with noted varicose veins in the right lower extremity that are tender to touch and enlarged. She had a lower extremity ultrasound 7 days ago that per review of care everywhere did not reveal any thrombus. Given persistent pain, overlying erythema, and swelling her right lower extremity we will repeat a right lower extremity ultrasound today to evaluate for DVT. We will also refer the patient to vascular surgery for further evaluation. The ultrasound will be done today and we will call the patient with the results. If ultrasound is negative for thrombus would consider treating for cellulitis though the erythema does not have any induration and does not appear consistent with a cellulitic rash. Patient was given return precautions.  Frequent urination Patient had several weeks of frequent urination and difficulty holding her urine. She has really no other symptoms with this. She has no other urinary complaints of this. She has no vaginal complaints with this. She has a benign abdominal exam today. 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 Her UA was unremarkable today. Does not have any back pain or fever or neurological deficits to indicate this could be coming from  her spine. Suspect this is related to her bladder prolapse and we'll refer her to urogynecology for further evaluation. We will additionally order CMP to evaluate her renal function. She was given return precautions.  Abdominal wall cramping Patient describes cramping of her abdominal wall. No other symptoms with this. She notes the muscles feel sore when this happens. She has a benign abdominal exam today. Vitals are stable today. She recently had a CMP that was negative for liver issues or electrolyte issues. This could represent muscular cramping of the abdominal wall or some intra-abdominal process. Discussed this with the patient. We'll obtain a CMP, lipase, and CBC to evaluate this further. I did discuss obtaining ultrasound or CT scan of her abdomen and pelvis to evaluate this further though she declined these at this time. She was given return precautions.    Orders Placed This Encounter  Procedures  . US Venous Img Lower Unilateral Right    Standing Status: Future     Number of Occurrences: 1     Standing Expiration Date: 11/04/2016    Order Specific Question:  Reason for Exam (SYMPTOM  OR DIAGNOSIS  REQUIRED)    Answer:  swelling    Order Specific Question:  Preferred imaging location?    Answer:  [facility]  . Comp Met (CMET)  . Lipase  . CBC w/Diff  . Ambulatory referral to Vascular Surgery    Referral Priority:  Routine    Referral Type:  Surgical    Referral Reason:  Specialty Services Required    Requested Specialty:  Vascular Surgery    Number of Visits Requested:  1  . Ambulatory referral to Urogynecology    Referral Priority:  Routine    Referral Type:  Consultation    Referral Reason:  Specialty Services Required    Requested Specialty:  Urology    Number of Visits Requested:  1  . POCT Urinalysis Dipstick    Tommi Rumps

## 2015-09-05 NOTE — Assessment & Plan Note (Signed)
Patient had several weeks of frequent urination and difficulty holding her urine. She has really no other symptoms with this. She has no other urinary complaints of this. She has no vaginal complaints with this. She has a benign abdominal exam today. 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 Her UA was unremarkable today. Does not have any back pain or fever or neurological deficits to indicate this could be coming from her spine. Suspect this is related to her bladder prolapse and we'll refer her to urogynecology for further evaluation. We will additionally order CMP to evaluate her renal function. She was given return precautions.

## 2015-09-05 NOTE — Progress Notes (Signed)
Pre visit review using our clinic review tool, if applicable. No additional management support is needed unless otherwise documented below in the visit note. 

## 2015-09-05 NOTE — Assessment & Plan Note (Signed)
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 She had a lower extremity ultrasound 7 days ago that per review of care everywhere did not reveal any thrombus. Given persistent pain, overlying erythema, and swelling her right lower extremity we will repeat a right lower extremity ultrasound today to evaluate for DVT. We will also refer the patient to vascular surgery for further evaluation. The ultrasound will be done today and we will call the patient with the results. If ultrasound is negative for thrombus would consider treating for cellulitis though the erythema does not have any induration and does not appear consistent with a cellulitic rash. Patient was given return precautions.

## 2015-09-05 NOTE — Patient Instructions (Signed)
Nice to meet you. We will obtain an [place] of you right leg.  We will refer you to vascular surgery for evaluation of your varicose veins.  We will refer you to urologynecology for further evaluation of you urinary issues.  We will obtain lab work as well.  If you develop worsening pain in her legs, swelling in her legs, chest pain, shortness of breath, abdominal pain, nausea, vomiting, diarrhea, fever, burning with urination, or any new symptoms please seek medical attention.

## 2015-09-05 NOTE — Telephone Encounter (Signed)
Called patient to discuss ultrasound results of her right lower extremity. 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 There is no DVT noted. Discussed this with the patient. Discussed that she had no clot in the deep vein area which is where we typically think of clots that mobilize to the lungs. Advised that her clot is superficial. I did advise that she would benefit from anticoagulation for this given risk factors of obesity and the fact that she does not mobilize herself well. I discussed the benefits and the risks of anticoagulation and patient wanted to know when her appointment with vascular surgery would be prior to making this decision. I had our referral coordinator contact the vascular surgeon's office to determine when her appointment would be and they were able to schedule one for 09/06/2015 at 11:15 in the morning. I called the patient back to discuss this and she opted to wait for the appointment since it would be tomorrow prior starting anticoagulation. I advised her that if she had any increased swelling or pain or if she develops chest pain or trouble breathing she would need to seek medical attention immediately. She voiced understanding of this and the risk of not being on any anticoagulation with risk for propagation of clot. She was given return precautions.

## 2015-09-06 DIAGNOSIS — M7989 Other specified soft tissue disorders: Secondary | ICD-10-CM | POA: Diagnosis not present

## 2015-09-06 DIAGNOSIS — I8000 Phlebitis and thrombophlebitis of superficial vessels of unspecified lower extremity: Secondary | ICD-10-CM | POA: Diagnosis not present

## 2015-09-06 DIAGNOSIS — M79609 Pain in unspecified limb: Secondary | ICD-10-CM | POA: Diagnosis not present

## 2015-09-06 DIAGNOSIS — I8310 Varicose veins of unspecified lower extremity with inflammation: Secondary | ICD-10-CM | POA: Diagnosis not present

## 2015-09-06 LAB — CBC WITH DIFFERENTIAL/PLATELET
Basophils Absolute: 0.1 10*3/uL (ref 0.0–0.1)
Basophils Relative: 0.5 % (ref 0.0–3.0)
EOS PCT: 2.5 % (ref 0.0–5.0)
Eosinophils Absolute: 0.2 10*3/uL (ref 0.0–0.7)
HCT: 42.8 % (ref 36.0–46.0)
HEMOGLOBIN: 14.2 g/dL (ref 12.0–15.0)
LYMPHS PCT: 22 % (ref 12.0–46.0)
Lymphs Abs: 2.1 10*3/uL (ref 0.7–4.0)
MCHC: 33.3 g/dL (ref 30.0–36.0)
MCV: 91.3 fl (ref 78.0–100.0)
MONO ABS: 0.6 10*3/uL (ref 0.1–1.0)
Monocytes Relative: 6.8 % (ref 3.0–12.0)
Neutro Abs: 6.4 10*3/uL (ref 1.4–7.7)
Neutrophils Relative %: 68.2 % (ref 43.0–77.0)
Platelets: 177 10*3/uL (ref 150.0–400.0)
RBC: 4.68 Mil/uL (ref 3.87–5.11)
RDW: 13.5 % (ref 11.5–15.5)
WBC: 9.4 10*3/uL (ref 4.0–10.5)

## 2015-09-06 LAB — COMPREHENSIVE METABOLIC PANEL
ALBUMIN: 3.9 g/dL (ref 3.5–5.2)
ALT: 18 U/L (ref 0–35)
AST: 23 U/L (ref 0–37)
Alkaline Phosphatase: 116 U/L (ref 39–117)
BUN: 13 mg/dL (ref 6–23)
CALCIUM: 9.5 mg/dL (ref 8.4–10.5)
CHLORIDE: 102 meq/L (ref 96–112)
CO2: 28 mEq/L (ref 19–32)
CREATININE: 0.88 mg/dL (ref 0.40–1.20)
GFR: 66.78 mL/min (ref >60.00)
Glucose, Bld: 241 mg/dL — ABNORMAL HIGH (ref 70–99)
POTASSIUM: 4.2 meq/L (ref 3.5–5.1)
SODIUM: 138 meq/L (ref 135–145)
Total Bilirubin: 0.6 mg/dL (ref 0.2–1.2)
Total Protein: 6.6 g/dL (ref 6.0–8.3)

## 2015-09-06 LAB — LIPASE: LIPASE: 12 U/L (ref 11.0–59.0)

## 2015-09-07 ENCOUNTER — Encounter: Payer: Self-pay | Admitting: Family Medicine

## 2015-09-07 ENCOUNTER — Ambulatory Visit (INDEPENDENT_AMBULATORY_CARE_PROVIDER_SITE_OTHER): Payer: Medicare Other | Admitting: Internal Medicine

## 2015-09-07 ENCOUNTER — Encounter: Payer: Self-pay | Admitting: Internal Medicine

## 2015-09-07 VITALS — BP 120/68 | HR 63 | Ht 67.0 in | Wt 269.0 lb

## 2015-09-07 DIAGNOSIS — J449 Chronic obstructive pulmonary disease, unspecified: Secondary | ICD-10-CM | POA: Diagnosis not present

## 2015-09-07 DIAGNOSIS — E119 Type 2 diabetes mellitus without complications: Secondary | ICD-10-CM | POA: Diagnosis not present

## 2015-09-07 DIAGNOSIS — I10 Essential (primary) hypertension: Secondary | ICD-10-CM | POA: Diagnosis not present

## 2015-09-07 DIAGNOSIS — Z79899 Other long term (current) drug therapy: Secondary | ICD-10-CM | POA: Diagnosis not present

## 2015-09-07 DIAGNOSIS — I809 Phlebitis and thrombophlebitis of unspecified site: Secondary | ICD-10-CM | POA: Diagnosis not present

## 2015-09-07 DIAGNOSIS — J309 Allergic rhinitis, unspecified: Secondary | ICD-10-CM | POA: Diagnosis not present

## 2015-09-07 DIAGNOSIS — Z7951 Long term (current) use of inhaled steroids: Secondary | ICD-10-CM | POA: Diagnosis not present

## 2015-09-07 DIAGNOSIS — M79606 Pain in leg, unspecified: Secondary | ICD-10-CM | POA: Diagnosis not present

## 2015-09-07 DIAGNOSIS — Z23 Encounter for immunization: Secondary | ICD-10-CM

## 2015-09-07 DIAGNOSIS — M1990 Unspecified osteoarthritis, unspecified site: Secondary | ICD-10-CM | POA: Diagnosis not present

## 2015-09-07 MED ORDER — FLUTICASONE-SALMETEROL 250-50 MCG/DOSE IN AEPB: 1.0000 | INHALATION_SPRAY | Freq: Two times a day (BID) | RESPIRATORY_TRACT | Status: DC

## 2015-09-07 MED ORDER — CETIRIZINE HCL 10 MG PO TABS: 10.0000 mg | ORAL_TABLET | Freq: Every day | ORAL | Status: DC

## 2015-09-07 MED ORDER — ALBUTEROL SULFATE HFA 108 (90 BASE) MCG/ACT IN AERS: 2.0000 | INHALATION_SPRAY | Freq: Every day | RESPIRATORY_TRACT | Status: DC

## 2015-09-07 NOTE — Patient Instructions (Signed)
Chronic Obstructive Pulmonary Disease Chronic obstructive pulmonary disease (COPD) is a common lung condition in which airflow from the lungs is limited. COPD is a general term that can be used to describe many different lung problems that limit airflow, including both chronic bronchitis and emphysema. If you have COPD, your lung function will probably never return to normal, but there are measures you can take to improve lung function and make yourself feel better. CAUSES   Smoking (common).  Exposure to secondhand smoke.  Genetic problems.  Chronic inflammatory lung diseases or recurrent infections. SYMPTOMS  Shortness of breath, especially with physical activity.  Deep, persistent (chronic) cough with a large amount of thick mucus.  Wheezing.  Rapid breaths (tachypnea).  Gray or bluish discoloration (cyanosis) of the skin, especially in your fingers, toes, or lips.  Fatigue.  Weight loss.  Frequent infections or episodes when breathing symptoms become much worse (exacerbations).  Chest tightness. DIAGNOSIS Your health care provider will take a medical history and perform a physical examination to diagnose COPD. Additional tests for COPD may include:  Lung (pulmonary) function tests.  Chest X-ray.  CT scan.  Blood tests. TREATMENT  Treatment for COPD may include:  Inhaler and nebulizer medicines. These help manage the symptoms of COPD and make your breathing more comfortable.  Supplemental oxygen. Supplemental oxygen is only helpful if you have a low oxygen level in your blood.  Exercise and physical activity. These are beneficial for nearly all people with COPD.  Lung surgery or transplant.  Nutrition therapy to gain weight, if you are underweight.  Pulmonary rehabilitation. This may involve working with a team of health care providers and specialists, such as respiratory, occupational, and physical therapists. HOME CARE INSTRUCTIONS  Take all medicines  (inhaled or pills) as directed by your health care provider.  Avoid over-the-counter medicines or cough syrups that dry up your airway (such as antihistamines) and slow down the elimination of secretions unless instructed otherwise by your health care provider.  If you are a smoker, the most important thing that you can do is stop smoking. Continuing to smoke will cause further lung damage and breathing trouble. Ask your health care provider for help with quitting smoking. He or she can direct you to community resources or hospitals that provide support.  Avoid exposure to irritants such as smoke, chemicals, and fumes that aggravate your breathing.  Use oxygen therapy and pulmonary rehabilitation if directed by your health care provider. If you require home oxygen therapy, ask your health care provider whether you should purchase a pulse oximeter to measure your oxygen level at home.  Avoid contact with individuals who have a contagious illness.  Avoid extreme temperature and humidity changes.  Eat healthy foods. Eating smaller, more frequent meals and resting before meals may help you maintain your strength.  Stay active, but balance activity with periods of rest. Exercise and physical activity will help you maintain your ability to do things you want to do.  Preventing infection and hospitalization is very important when you have COPD. Make sure to receive all the vaccines your health care provider recommends, especially the pneumococcal and influenza vaccines. Ask your health care provider whether you need a pneumonia vaccine.  Learn and use relaxation techniques to manage stress.  Learn and use controlled breathing techniques as directed by your health care provider. Controlled breathing techniques include:  Pursed lip breathing. Start by breathing in (inhaling) through your nose for 1 second. Then, purse your lips as if you were   going to whistle and breathe out (exhale) through the  pursed lips for 2 seconds.  Diaphragmatic breathing. Start by putting one hand on your abdomen just above your waist. Inhale slowly through your nose. The hand on your abdomen should move out. Then purse your lips and exhale slowly. You should be able to feel the hand on your abdomen moving in as you exhale.  Learn and use controlled coughing to clear mucus from your lungs. Controlled coughing is a series of short, progressive coughs. The steps of controlled coughing are: 1. Lean your head slightly forward. 2. Breathe in deeply using diaphragmatic breathing. 3. Try to hold your breath for 3 seconds. 4. Keep your mouth slightly open while coughing twice. 5. Spit any mucus out into a tissue. 6. Rest and repeat the steps once or twice as needed. SEEK MEDICAL CARE IF:  You are coughing up more mucus than usual.  There is a change in the color or thickness of your mucus.  Your breathing is more labored than usual.  Your breathing is faster than usual. SEEK IMMEDIATE MEDICAL CARE IF:  You have shortness of breath while you are resting.  You have shortness of breath that prevents you from:  Being able to talk.  Performing your usual physical activities.  You have chest pain lasting longer than 5 minutes.  Your skin color is more cyanotic than usual.  You measure low oxygen saturations for longer than 5 minutes with a pulse oximeter. MAKE SURE YOU:  Understand these instructions.  Will watch your condition.  Will get help right away if you are not doing well or get worse.   This information is not intended to replace advice given to you by your health care provider. Make sure you discuss any questions you have with your health care provider.   Document Released: 08/06/2005 Document Revised: 11/17/2014 Document Reviewed: 06/23/2013 Elsevier Interactive Patient Education 2016 [employer] Inc[employer]  

## 2015-09-07 NOTE — Progress Notes (Signed)
[facility] Consultation     Date: 09/07/2015,   MRN# [id number] Darlene Robbins [birth year] Code Status:  Hosp day:$[email or web address]'BtafJfKe$ @ Referring MD: $[email or web address]@[email or web address]'[email or web address]$ @     PCP:      AdmissionWeight: 269 lb (122.018 kg)                 CurrentWeight: 269 lb (122.018 kg) Darlene Robbins is a 74 y.o. old female seen in consultation for follow up SOB and acute bronchitis.  CC: follow up SOB,wheezing HPI: patient with recent acute bronchitis finished course pf prednisone and oral abx Patient previosuly seen by Dr Lake Bells  And with dx of ASTHMA  after review of PFT's and discussion with patient-she has had extensive secodn hand smoking exposure from husband and also environmental exposure from her previous job  Patient with findings c/w COPD Patient has no signs or symptoms of infection at this time No wheezing, no SOB,DOE  HHP HPI H   MEDICATIONS     Current Medication:   Current outpatient prescriptions:  .  albuterol (VENTOLIN HFA) 108 (90 BASE) MCG/ACT inhaler, Inhale 2 puffs into the lungs daily., Disp: 36 g, Rfl: 12 .  benzonatate (TESSALON) 200 MG capsule, Take 1 capsule (200 mg total) by mouth 2 (two) times daily as needed for cough., Disp: 30 capsule, Rfl: 0 .  Blood Glucose Calibration (ACCU-CHEK AVIVA) SOLN, 1 each by In Vitro route once., Disp: 1 each, Rfl: 2 .  Blood Glucose Monitoring Suppl (ONE TOUCH ULTRA SYSTEM KIT) W/DEVICE KIT, 1 kit by Does not apply route once. Use as directed. Test blood sugars 1-2 times daily., Disp: 1 each, Rfl: 0 .  diclofenac sodium (VOLTAREN) 1 % GEL, Apply 1 application topically 2 (two) times daily as needed.  , Disp: , Rfl:  .  furosemide (LASIX) 20 MG tablet, Take 1 tablet (20 mg total) by mouth daily as needed., Disp: 90 tablet, Rfl: 4 .  gentamicin ointment (GARAMYCIN) 0.1 %, Apply 1 application topically 3 (three) times daily., Disp: 30 g, Rfl: 3 .  glucose blood test strip, 1 each by Other route as needed  for other. Use as instructed with One Touch Ultra Glucometer. Test blood sugars at home 1-2 times daily, Disp: 100 each, Rfl: 3 .  Lancets MISC, Use with One Touch Ultra Test blood sugars 1-2 times daily., Disp: 100 each, Rfl: 3 .  levothyroxine (SYNTHROID, LEVOTHROID) 75 MCG tablet, Take 1 tablet (75 mcg total) by mouth daily., Disp: 90 tablet, Rfl: 3 .  pantoprazole (PROTONIX) 40 MG tablet, Take 1 tablet (40 mg total) by mouth 2 (two) times daily., Disp: 180 tablet, Rfl: 3 .  cetirizine (ZYRTEC ALLERGY) 10 MG tablet, Take 1 tablet (10 mg total) by mouth daily., Disp: 90 tablet, Rfl: 12 .  Fluticasone-Salmeterol (ADVAIR DISKUS) 250-50 MCG/DOSE AEPB, Inhale 1 puff into the lungs 2 (two) times daily., Disp: 180 each, Rfl: 11     ALLERGIES   Codeine; Penicillins; and Sulfa antibiotics     REVIEW OF SYSTEMS   Review of Systems  Constitutional: Positive for malaise/fatigue. Negative for fever, chills and weight loss.  HENT: Positive for congestion.   Respiratory: Positive for shortness of breath and wheezing.   Cardiovascular: Positive for leg swelling.  Gastrointestinal: Negative.   Neurological: Negative.      VS: BP 120/68 mmHg  Pulse 63  Ht $R'5\' 7"'Fp$  (1.702 m)  Wt 269 lb (122.018 kg)  BMI 42.12 kg/m2  SpO2 98%     PHYSICAL EXAM  Physical Exam  Constitutional: She is oriented to person, place, and time. She appears well-developed and well-nourished. No distress.  HENT:  Head: Normocephalic and atraumatic.  Eyes: EOM are normal. Pupils are equal, round, and reactive to light.  Neck: Normal range of motion. Neck supple.  Cardiovascular: Normal rate, regular rhythm and normal heart sounds.   No murmur heard. Pulmonary/Chest: No respiratory distress. She has no wheezes.  Abdominal: Soft. Bowel sounds are normal.  Musculoskeletal: Normal range of motion. She exhibits edema.  Neurological: She is alert and oriented to person, place, and time.  Skin: Skin is warm. She is not  diaphoretic.  Psychiatric: She has a normal mood and affect.        LABS    Recent Labs     09/05/15  1416  HGB  14.2  HCT  42.8  MCV  91.3  WBC  9.4  BUN  13  CREATININE  0.88  GLUCOSE  241*  CALCIUM  9.5  ,     ASSESSMENT/PLAN   74 yo obese white female seen today for follow up acute Bronchitis with acute COPD exacerbation Patient symptoms have much improved with steroids and abx. After further assessment, patient with mild-moderate obstructive airways disease with allergic rhinitis  1.continue advair as prescribed 2.start zyrtec  3.albuterol as needed  Follow up in 6 months  I have personally obtained a history, examined the patient, evaluated laboratory and independently reviewed imaging results, formulated the assessment and plan and placed orders.  The Patient requires high complexity decision making for assessment and support, frequent evaluation and titration of therapies, application of advanced monitoring technologies and extensive interpretation of multiple databases.   Patient satisfied with Plan of action and management. All questions answered   Corrin Parker, M.D.  [facility]  Medical Director [place] Director [facility] Cardio-Pulmonary Department

## 2015-09-19 ENCOUNTER — Ambulatory Visit (INDEPENDENT_AMBULATORY_CARE_PROVIDER_SITE_OTHER): Payer: Medicare Other | Admitting: Internal Medicine

## 2015-09-19 ENCOUNTER — Encounter: Payer: Self-pay | Admitting: Internal Medicine

## 2015-09-19 VITALS — BP 132/82 | HR 72 | Temp 98.3°F | Ht 67.0 in | Wt 269.4 lb

## 2015-09-19 DIAGNOSIS — E118 Type 2 diabetes mellitus with unspecified complications: Secondary | ICD-10-CM | POA: Diagnosis not present

## 2015-09-19 DIAGNOSIS — R109 Unspecified abdominal pain: Secondary | ICD-10-CM

## 2015-09-19 DIAGNOSIS — R350 Frequency of micturition: Secondary | ICD-10-CM | POA: Diagnosis not present

## 2015-09-19 DIAGNOSIS — J42 Unspecified chronic bronchitis: Secondary | ICD-10-CM | POA: Diagnosis not present

## 2015-09-19 LAB — HEMOGLOBIN A1C: Hgb A1c MFr Bld: 7.7 % — ABNORMAL HIGH (ref 4.6–6.5)

## 2015-09-19 MED ORDER — NYSTATIN 100000 UNIT/GM EX POWD: CUTANEOUS | Status: AC

## 2015-09-19 NOTE — Progress Notes (Signed)
Pre visit review using our clinic review tool, if applicable. No additional management support is needed unless otherwise documented below in the visit note. 

## 2015-09-19 NOTE — Assessment & Plan Note (Signed)
Symptoms improved after stopping Ibuprofen. Likely mild gastric irritation. Will hold Ibuprofen.

## 2015-09-19 NOTE — Assessment & Plan Note (Signed)
Exam normal today. Reviewed notes from Dr. Mortimer Fries. Continue Advair and prn Albuterol.

## 2015-09-19 NOTE — Assessment & Plan Note (Addendum)
Will check A1c with labs today. Encouraged better compliance with diet.

## 2015-09-19 NOTE — Assessment & Plan Note (Signed)
Persistent symptoms. Evaluation with urogynecology pending.

## 2015-09-19 NOTE — Patient Instructions (Signed)
Labs today.  Follow up in 3 months or sooner as needed. 

## 2015-09-19 NOTE — Progress Notes (Signed)
Subjective:    Patient ID: Darlene Robbins, female    DOB: [birth year], 74 y.o.   MRN: [id number]  HPI  74YO female presents for follow up.  Last seen 10/12 for bronchitis and 10/26 for urinary frequency. Also referred to urogynecology for concern of frequent urination and possible bladder prolapse. She was also evaluated for swelling in the right leg. US showed no DVT.  COPD - Concerned that she has been misdiagnosed. She is not a smoker, but husband was smoker in home for 25years.  Urinary frequency - Continues to have frequent urination. Mostly at night, has symptoms of urgency and incontinence. During the day, does not have these symptoms. Trying to limit fluid intake. Tried medication for this in the past with no improvement.  Rash - under pannus folds. No improvement with [place] and Zinc Oxide.  Abdominal pain - Symptoms have improved. Only occasional cramping and pains like hunger pains. Not taking anything for this.  DM - Notes recent dietary indiscretion. Has not been checking BG.   Wt Readings from Last 3 Encounters:  09/19/15 269 lb 6 oz (122.188 kg)  09/07/15 269 lb (122.018 kg)  09/05/15 268 lb 12.8 oz (121.927 kg)   BP Readings from Last 3 Encounters:  09/19/15 132/82  09/07/15 120/68  09/05/15 112/70    Past Medical History  Diagnosis Date  . Thyroid disease   . Kidney stones     laser surgery in the past  . COPD (chronic obstructive pulmonary disease) ([place])     followed by Dr.Fleming  . Arthritis     uses celebrex  . Sleep apnea     CPAP 7?  . Abdominal pain     Resolved,MRSA and yeast likely colonization in setting of multiple antibiotics.  . Vitamin D deficiency 07-11-2010    drisdol 50,000 units 1 weekly x 12 weeks no refill active   Family History  Problem Relation Age of Onset  . Peripheral vascular disease Mother   . Heart disease Mother   . COPD Mother   . Dementia Father   . ALS Brother   . Heart disease Brother     s/p CABG     Past Surgical History  Procedure Laterality Date  . Straighten nasal septum      Dr.McQueen  . Laser surgery for kidney stones      followed by Dr.Humphries  . Vein surgery      Dr.Schnier  . Cataract extraction Bilateral     Dr. Rosana Berger   Social History   Social History  . Marital Status: Widowed    Spouse Name: N/A  . Number of Children: N/A  . Years of Education: N/A   Social History Main Topics  . Smoking status: Never Smoker   . Smokeless tobacco: Never Used  . Alcohol Use: No  . Drug Use: No  . Sexual Activity: Not Asked   Other Topics Concern  . None   Social History Narrative   Lives with son.    Review of Systems  Constitutional: Negative for fever, chills, appetite change, fatigue and unexpected weight change.  Eyes: Negative for visual disturbance.  Respiratory: Negative for shortness of breath.   Cardiovascular: Negative for chest pain and leg swelling.  Gastrointestinal: Negative for nausea, vomiting, abdominal pain, diarrhea and constipation.  Genitourinary: Positive for frequency. Negative for dysuria, urgency, hematuria, flank pain, vaginal discharge, difficulty urinating, vaginal pain and pelvic pain.  Musculoskeletal: Negative for myalgias, back pain and arthralgias.  Skin:  Negative for color change and rash.  Hematological: Negative for adenopathy. Does not bruise/bleed easily.  Psychiatric/Behavioral: Negative for suicidal ideas, sleep disturbance and dysphoric mood. The patient is not nervous/anxious.        Objective:    BP 132/82 mmHg  Pulse 72  Temp(Src) 98.3 F (36.8 C) (Oral)  Ht 5\' 7"  (1.702 m)  Wt 269 lb 6 oz (122.188 kg)  BMI 42.18 kg/m2  SpO2 98% Physical Exam  Constitutional: She is oriented to person, place, and time. She appears well-developed and well-nourished. No distress.  HENT:  Head: Normocephalic and atraumatic.  Right Ear: External ear normal.  Left Ear: External ear normal.  Nose: Nose normal.  Mouth/Throat:  Oropharynx is clear and moist. No oropharyngeal exudate.  Eyes: Conjunctivae are normal. Pupils are equal, round, and reactive to light. Right eye exhibits no discharge. Left eye exhibits no discharge. No scleral icterus.  Neck: Normal range of motion. Neck supple. No tracheal deviation present. No thyromegaly present.  Cardiovascular: Normal rate, regular rhythm, normal heart sounds and intact distal pulses.  Exam reveals no gallop and no friction rub.   No murmur heard. Pulmonary/Chest: Effort normal and breath sounds normal. No respiratory distress. She has no wheezes. She has no rales. She exhibits no tenderness.  Musculoskeletal: Normal range of motion. She exhibits no edema or tenderness.  Lymphadenopathy:    She has no cervical adenopathy.  Neurological: She is alert and oriented to person, place, and time. No cranial nerve deficit. She exhibits normal muscle tone. Coordination normal.  Skin: Skin is warm and dry. No rash noted. She is not diaphoretic. No erythema. No pallor.  Psychiatric: She has a normal mood and affect. Her behavior is normal. Judgment and thought content normal.          Assessment & Plan:   Problem List Items Addressed This Visit      High   COPD (chronic obstructive pulmonary disease) ([place])    Exam normal today. Reviewed notes from Dr. Mortimer Fries. Continue Advair and prn Albuterol.      Diabetes type 2, controlled ([place]) - Primary    Will check A1c with labs today. Encouraged better compliance with diet.      Relevant Orders   Hemoglobin A1c     Unprioritized   Abdominal wall cramping    Symptoms improved after stopping Ibuprofen. Likely mild gastric irritation. Will hold Ibuprofen.      Urinary frequency    Persistent symptoms. Evaluation with urogynecology pending.          Return in about 3 months (around 12/20/2015) for Recheck of Diabetes.

## 2015-09-20 ENCOUNTER — Ambulatory Visit: Payer: Medicare Other | Admitting: Pulmonary Disease

## 2015-09-25 DIAGNOSIS — E782 Mixed hyperlipidemia: Secondary | ICD-10-CM | POA: Diagnosis not present

## 2015-09-25 DIAGNOSIS — I38 Endocarditis, valve unspecified: Secondary | ICD-10-CM | POA: Diagnosis not present

## 2015-09-25 DIAGNOSIS — G4733 Obstructive sleep apnea (adult) (pediatric): Secondary | ICD-10-CM | POA: Diagnosis not present

## 2015-09-25 DIAGNOSIS — I10 Essential (primary) hypertension: Secondary | ICD-10-CM | POA: Diagnosis not present

## 2015-10-01 ENCOUNTER — Ambulatory Visit: Payer: Self-pay

## 2015-10-02 LAB — HM DIABETES EYE EXAM

## 2015-10-17 ENCOUNTER — Ambulatory Visit: Payer: Medicare Other | Admitting: Urology

## 2015-10-17 ENCOUNTER — Encounter: Payer: Self-pay | Admitting: Urology

## 2015-10-17 VITALS — BP 161/71 | HR 76 | Ht 67.0 in | Wt 272.9 lb

## 2015-10-17 DIAGNOSIS — D0439 Carcinoma in situ of skin of other parts of face: Secondary | ICD-10-CM | POA: Diagnosis not present

## 2015-10-17 DIAGNOSIS — L218 Other seborrheic dermatitis: Secondary | ICD-10-CM | POA: Diagnosis not present

## 2015-10-17 DIAGNOSIS — L570 Actinic keratosis: Secondary | ICD-10-CM | POA: Diagnosis not present

## 2015-10-17 DIAGNOSIS — R600 Localized edema: Secondary | ICD-10-CM | POA: Insufficient documentation

## 2015-10-17 DIAGNOSIS — X32XXXA Exposure to sunlight, initial encounter: Secondary | ICD-10-CM | POA: Diagnosis not present

## 2015-10-17 DIAGNOSIS — R350 Frequency of micturition: Secondary | ICD-10-CM

## 2015-10-17 DIAGNOSIS — N3946 Mixed incontinence: Secondary | ICD-10-CM

## 2015-10-17 DIAGNOSIS — D485 Neoplasm of uncertain behavior of skin: Secondary | ICD-10-CM | POA: Diagnosis not present

## 2015-10-17 DIAGNOSIS — L578 Other skin changes due to chronic exposure to nonionizing radiation: Secondary | ICD-10-CM | POA: Diagnosis not present

## 2015-10-17 DIAGNOSIS — I38 Endocarditis, valve unspecified: Secondary | ICD-10-CM | POA: Insufficient documentation

## 2015-10-17 HISTORY — DX: Endocarditis, valve unspecified: I38

## 2015-10-17 LAB — URINALYSIS, COMPLETE
Bilirubin, UA: NEGATIVE
Glucose, UA: NEGATIVE
Ketones, UA: NEGATIVE
NITRITE UA: NEGATIVE
PH UA: 5.5 (ref 5.0–7.5)
Protein, UA: NEGATIVE
Specific Gravity, UA: 1.025 (ref 1.005–1.030)
UUROB: 0.2 mg/dL (ref 0.2–1.0)

## 2015-10-17 LAB — MICROSCOPIC EXAMINATION: RBC MICROSCOPIC, UA: NONE SEEN /HPF (ref 0–?)

## 2015-10-17 NOTE — Progress Notes (Unsigned)
10/17/2015 2:02 PM   Darlene Robbins Feb 19, 1941 [id number]  Referring provider: [name], MD [address] [place], [place] [address]  Chief Complaint  Patient presents with  . Bladder Prolapse    new pt     HPI: The patient is a 74 year old woman who has mixed stress urge incontinence. I believe the urge component is more significant. She sometimes the cough is easy without any lifting. 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 She'll were 3-6 pads a day moderately wet or sometimes soaking    She gets up to 3 times a night and voids every 2 archer the daytime. She's had lithotripsy in the past and likely ureteroscopy. She has rare urinary tract infections. She's not had bladder surgery. She is a borderline diabetic. She has no neurologic retractor symptoms. She's not had hysterectomy. Oxybutynin is failed. Bowel movements are normal  Modifying factors: There are no other modifying factors  Associated signs and symptoms: There are no other associated signs and symptoms Aggravating and relieving factors: There are no other aggravating or relieving factors Severity: Moderate Duration: Persistent   PMH: Past Medical History  Diagnosis Date  . Thyroid disease   . Kidney stones     laser surgery in the past  . COPD (chronic obstructive pulmonary disease) ([place])     followed by Dr.Fleming  . Arthritis     uses celebrex  . Sleep apnea     CPAP 7?  . Abdominal pain     Resolved,MRSA and yeast likely colonization in setting of multiple antibiotics.  . Vitamin D deficiency 07-11-2010    drisdol 50,000 units 1 weekly x 12 weeks no refill active  . Hypertension 07/30/2012  . Varicose veins 09/05/2015  . Benign essential HTN 03/02/2015  . Heart valve disease 10/17/2015  . Leg varices 04/25/2014    Surgical History: Past Surgical History  Procedure Laterality Date  . Straighten nasal septum      Dr.McQueen  . Laser surgery for  kidney stones      followed by Dr.Humphries  . Vein surgery      Dr.Schnier  . Cataract extraction Bilateral     Dr. Rosana Berger  . Tonsillectomy      Home Medications:    Medication List       This list is accurate as of: 10/17/15  2:02 PM.  Always use your most recent med list.               ACCU-CHEK AVIVA Soln  1 each by In Vitro route once.     albuterol 108 (90 BASE) MCG/ACT inhaler  Commonly known as:  VENTOLIN HFA  Inhale 2 puffs into the lungs daily.     benzonatate 200 MG capsule  Commonly known as:  TESSALON  Take 1 capsule (200 mg total) by mouth 2 (two) times daily as needed for cough.     cetirizine 10 MG tablet  Commonly known as:  ZYRTEC ALLERGY  Take 1 tablet (10 mg total) by mouth daily.     diclofenac sodium 1 % Gel  Commonly known as:  VOLTAREN  Apply 1 application topically 2 (two) times daily as needed.     Fluticasone-Salmeterol 250-50 MCG/DOSE Aepb  Commonly known as:  ADVAIR DISKUS  Inhale 1 puff into the lungs 2 (two) times daily.     furosemide 20 MG tablet  Commonly known as:  LASIX  Take 1 tablet (20 mg total) by mouth  daily as needed.     gentamicin ointment 0.1 %  Commonly known as:  GARAMYCIN  Apply 1 application topically 3 (three) times daily.     glucose blood test strip  1 each by Other route as needed for other. Use as instructed with One Touch Ultra Glucometer. Test blood sugars at home 1-2 times daily     Lancets Misc  Use with One Touch Ultra Test blood sugars 1-2 times daily.     levothyroxine 75 MCG tablet  Commonly known as:  SYNTHROID, LEVOTHROID  Take 1 tablet (75 mcg total) by mouth daily.     nystatin 100000 UNIT/GM Powd  Apply twice daily as needed     ONE TOUCH ULTRA SYSTEM KIT W/DEVICE Kit  1 kit by Does not apply route once. Use as directed. Test blood sugars 1-2 times daily.     pantoprazole 40 MG tablet  Commonly known as:  PROTONIX  Take 1 tablet (40 mg total) by mouth 2 (two) times daily.         Allergies:  Allergies  Allergen Reactions  . Codeine   . Penicillins   . Sulfa Antibiotics     Family History: Family History  Problem Relation Age of Onset  . Peripheral vascular disease Mother   . Heart disease Mother   . COPD Mother   . Dementia Father   . ALS Brother   . Heart disease Brother     s/p CABG    Social History:  reports that she has never smoked. She has never used smokeless tobacco. She reports that she does not drink alcohol or use illicit drugs.  ROS: UROLOGY Frequent Urination?: Yes Hard to postpone urination?: Yes Burning/pain with urination?: No Get up at night to urinate?: Yes Leakage of urine?: Yes Urine stream starts and stops?: No Trouble starting stream?: No Do you have to strain to urinate?: No Blood in urine?: No Urinary tract infection?: No Sexually transmitted disease?: No Injury to kidneys or bladder?: No Painful intercourse?: No Weak stream?: No Currently pregnant?: No Vaginal bleeding?: No Last menstrual period?: 1992  Gastrointestinal Nausea?: Yes Vomiting?: Yes Indigestion/heartburn?: Yes Diarrhea?: No Constipation?: No  Constitutional Fever: No Night sweats?: Yes Weight loss?: No Fatigue?: No  Skin Skin rash/lesions?: Yes Itching?: Yes  Eyes Blurred vision?: No Double vision?: No  Ears/Nose/Throat Sore throat?: Yes Sinus problems?: Yes  Hematologic/Lymphatic Swollen glands?: No Easy bruising?: Yes  Cardiovascular Leg swelling?: Yes Chest pain?: No  Respiratory Cough?: No Shortness of breath?: Yes  Endocrine Excessive thirst?: No  Musculoskeletal Back pain?: Yes Joint pain?: Yes  Neurological Headaches?: No Dizziness?: No  Psychologic Depression?: No Anxiety?: No  Physical Exam: BP 161/71 mmHg  Pulse 76  Ht $R'5\' 7"'JE$  (1.702 m)  Wt 123.787 kg (272 lb 14.4 oz)  BMI 42.73 kg/m2  Constitutional:  Alert and oriented, No acute distress. HEENT: [place] AT, moist mucus membranes.  Trachea  midline, no masses. Cardiovascular: No clubbing, cyanosis, or edema. Respiratory: Normal respiratory effort, no increased work of breathing. GI: Abdomen is soft, nontender, nondistended, no abdominal masses GU: No CVA tenderness. Pelvic examination was little bit limited due to her obesity but overall she tolerated the examination very well. In my opinion she had a smaller grade 2 cystocele and negative cough test. Skin: No rashes, bruises or suspicious lesions. Lymph: No cervical or inguinal adenopathy. Neurologic: Grossly intact, no focal deficits, moving all 4 extremities. Psychiatric: Normal mood and affect.  Laboratory Data: Lab Results  Component Value  Date   WBC 9.4 09/05/2015   HGB 14.2 09/05/2015   HCT 42.8 09/05/2015   MCV 91.3 09/05/2015   PLT 177.0 09/05/2015    Lab Results  Component Value Date   CREATININE 0.88 09/05/2015    No results found for: PSA  No results found for: TESTOSTERONE  Lab Results  Component Value Date   HGBA1C 7.7* 09/19/2015    Urinalysis    Component Value Date/Time   COLORURINE YELLOW 04/06/2014 1348   COLORURINE Straw 11/17/2011 1418   [place] 04/06/2014 1348   APPEARANCEUR Clear 11/17/2011 1418   LABSPEC >=1.030* 04/06/2014 1348   LABSPEC 1.008 11/17/2011 1418   PHURINE 5.5 04/06/2014 1348   PHURINE 6.0 11/17/2011 1418   GLUCOSEU NEGATIVE 04/06/2014 1348   GLUCOSEU Negative 11/17/2011 1418   HGBUR NEGATIVE 04/06/2014 1348   HGBUR Negative 11/17/2011 1418   BILIRUBINUR neg 09/05/2015 1324   BILIRUBINUR NEGATIVE 04/06/2014 1348   BILIRUBINUR Negative 11/17/2011 1418   KETONESUR NEGATIVE 04/06/2014 1348   KETONESUR Trace 11/17/2011 1418   PROTEINUR neg 09/05/2015 1324   PROTEINUR Negative 11/17/2011 1418   UROBILINOGEN 0.2 09/05/2015 1324   UROBILINOGEN 0.2 04/06/2014 1348   NITRITE neg 09/05/2015 1324   NITRITE NEGATIVE 04/06/2014 1348   NITRITE Negative 11/17/2011 1418   LEUKOCYTESUR Negative 09/05/2015 1324     LEUKOCYTESUR Negative 11/17/2011 1418    Pertinent Imaging: None  Assessment & Plan:  The patient is mixed stress urge incontinence but on the fourth syndrome. She denies enuresis. She is mildly frequency and moderate nocturia The role of urodynamics discussed.  I gave the patient 5 weeks Amira Petrick samples 50 mg. I'll see in a month. We'll order urodynamics that she does not reach her treatment goal  Diagnosis #1 mixed stress and urge incontinence Diagnoses #2 urinary frequency Diagnoses #3 nocturia   Reece Packer, MD  [facility] [address] [place], [place] [address] [phone]

## 2015-10-22 ENCOUNTER — Encounter: Payer: Self-pay | Admitting: Pulmonary Disease

## 2015-10-22 ENCOUNTER — Ambulatory Visit (INDEPENDENT_AMBULATORY_CARE_PROVIDER_SITE_OTHER): Payer: Medicare Other | Admitting: Pulmonary Disease

## 2015-10-22 VITALS — BP 122/68 | HR 71 | Ht 67.0 in | Wt 269.2 lb

## 2015-10-22 DIAGNOSIS — J441 Chronic obstructive pulmonary disease with (acute) exacerbation: Secondary | ICD-10-CM | POA: Diagnosis not present

## 2015-10-22 MED ORDER — PREDNISONE 20 MG PO TABS: 40.0000 mg | ORAL_TABLET | Freq: Every day | ORAL | Status: DC

## 2015-10-22 MED ORDER — DOXYCYCLINE HYCLATE 100 MG PO TABS: 100.0000 mg | ORAL_TABLET | Freq: Two times a day (BID) | ORAL | Status: DC

## 2015-10-22 NOTE — Progress Notes (Signed)
PROBLEMS: COPD - never smoker but significant 2nd hand smoke exposure. FEV1 70% predicted with 14% improvement after bronchodilator  INTERVAL HISTORY: Last seen 09/07/15 by Dr Mortimer Fries. No problems since then until 2-3 days prior to this visit  SUBJ: Called this morning for acute evaluation. Complaints of rhinorrhea, ST, cough productive purulent sputum for 2 days. Low grade fever (100.3) last night. No CP, hemoptysis, LE edema.  Great grand daughter had URI symptoms recently. Has prior history of severe asthmatic bronchitis requiring steroids, nebulized bronchodilators  OBJ: Filed Vitals:   10/22/15 1049  BP: 122/68  Pulse: 71  Height: 5\' 7"  (1.702 m)  Weight: 269 lb 3.2 oz (122.108 kg)  SpO2: 98%    Gen: WDWN in NAD, obese, frequent coughing during encounter HEENT: mild pharyngeal erythema Neck: NO LAN, no JVD noted Lungs: full BS, normal percussion note throughout, no adventitious sounds Cardiovascular: Reg rate, normal rhythm, no M noted Abdomen: Obese, soft, NT +BS Ext: no C/C/E Neuro: CNs intact, motor/sens grossly intact Skin: No lesions noted   DATA:   IMPRESSION: Mild COPD due to second hand smoke exposure. Possible component of chronic obstructive asthma. Well controlled on Advair with minimal use of albuterol MDI @ baseline Acute asthmatic bronchitis with purulent sputum  PLAN: Doxycycline 100 mg BID X 7 days Prednisone 40 mg daily X 5 days Encouraged more liberal use of albuterol during this acute illness ROV 4 weeks with Dr Mortimer Fries She is to call back at end of this week if not significantly improved   Darlene Mcardle, MD [facility]

## 2015-10-22 NOTE — Patient Instructions (Signed)
Doxycycline 100 mg twice a day for 7 days Prednisone 20 mg - 2 tablets daily for 5 days Follow up in 4 wks with Dr Mortimer Fries

## 2015-10-26 ENCOUNTER — Ambulatory Visit: Payer: Medicare Other

## 2015-10-26 ENCOUNTER — Ambulatory Visit
Admission: EM | Admit: 2015-10-26 | Discharge: 2015-10-27 | Disposition: A | Discharge status: Home / Self Care | Payer: Medicare Other | Attending: Family Medicine | Admitting: Family Medicine

## 2015-10-26 ENCOUNTER — Telehealth: Payer: Self-pay | Admitting: Pulmonary Disease

## 2015-10-26 DIAGNOSIS — I10 Essential (primary) hypertension: Secondary | ICD-10-CM | POA: Diagnosis not present

## 2015-10-26 DIAGNOSIS — K219 Gastro-esophageal reflux disease without esophagitis: Secondary | ICD-10-CM | POA: Diagnosis not present

## 2015-10-26 DIAGNOSIS — R05 Cough: Secondary | ICD-10-CM | POA: Diagnosis not present

## 2015-10-26 DIAGNOSIS — E039 Hypothyroidism, unspecified: Secondary | ICD-10-CM | POA: Diagnosis not present

## 2015-10-26 DIAGNOSIS — D0439 Carcinoma in situ of skin of other parts of face: Secondary | ICD-10-CM | POA: Diagnosis not present

## 2015-10-26 DIAGNOSIS — J449 Chronic obstructive pulmonary disease, unspecified: Secondary | ICD-10-CM | POA: Diagnosis not present

## 2015-10-26 DIAGNOSIS — I160 Hypertensive urgency: Secondary | ICD-10-CM | POA: Diagnosis not present

## 2015-10-26 DIAGNOSIS — J441 Chronic obstructive pulmonary disease with (acute) exacerbation: Secondary | ICD-10-CM

## 2015-10-26 DIAGNOSIS — Z79899 Other long term (current) drug therapy: Secondary | ICD-10-CM | POA: Diagnosis not present

## 2015-10-26 DIAGNOSIS — R918 Other nonspecific abnormal finding of lung field: Secondary | ICD-10-CM | POA: Diagnosis not present

## 2015-10-26 DIAGNOSIS — G4733 Obstructive sleep apnea (adult) (pediatric): Secondary | ICD-10-CM | POA: Diagnosis not present

## 2015-10-26 DIAGNOSIS — R0602 Shortness of breath: Secondary | ICD-10-CM | POA: Diagnosis not present

## 2015-10-26 DIAGNOSIS — R062 Wheezing: Secondary | ICD-10-CM | POA: Insufficient documentation

## 2015-10-26 DIAGNOSIS — Z6841 Body Mass Index (BMI) 40.0 and over, adult: Secondary | ICD-10-CM | POA: Diagnosis not present

## 2015-10-26 DIAGNOSIS — E1165 Type 2 diabetes mellitus with hyperglycemia: Secondary | ICD-10-CM | POA: Diagnosis not present

## 2015-10-26 DIAGNOSIS — J4531 Mild persistent asthma with (acute) exacerbation: Secondary | ICD-10-CM | POA: Diagnosis not present

## 2015-10-26 MED ORDER — IPRATROPIUM-ALBUTEROL 0.5-2.5 (3) MG/3ML IN SOLN
3.0000 mL | Freq: Four times a day (QID) | RESPIRATORY_TRACT | Status: DC
  Administered 2015-10-26 (×2): 3 mL via RESPIRATORY_TRACT

## 2015-10-26 MED ORDER — IPRATROPIUM-ALBUTEROL 0.5-2.5 (3) MG/3ML IN SOLN
3.0000 mL | Freq: Four times a day (QID) | RESPIRATORY_TRACT | Status: DC
  Administered 2015-10-26: 3 mL via RESPIRATORY_TRACT

## 2015-10-26 NOTE — ED Provider Notes (Signed)
[facility]  ____________________________________________  Time seen: Approximately 6:25 PM  I have reviewed the triage vital signs and the nursing notes.   HISTORY  Chief Complaint Wheezing   HPI Darlene Robbins is a 74 y.o. female  is presents with complaints of shortness of breath and wheezing. Patient reports that onset of wheezing was this past Sunday. Patient reports at that point in time she had had some runny nose, nasal congestion, cough and wheezes. Patient reports she was seen by her pulmonologist this past Monday and was started on prednisone  40 mg daily for 5 days as well as oral doxycycline for 7 days. Reports has one day left of prednisone.  Patient reports that initially she improved, however reports that she continued with wheezing with intermittent shortness of breath throughout the day today that gradually worsened. Patient reports that she is intermittently coughing but reports it is a dry cough. Also reports continued clear runny nose. Patient reports wheezing with ambulation as well as at rest. Patient reports she has been having shortness of breath when wheezes increase. Denies chest pain. Patient ports that she had fevers that were low-grade earlier in the week but denies fevers in the last 2 days. States no shortness of breath unless actively wheezing.   Denies chest pain, dizziness, abdominal pain, orthopnea, leg swelling, calf pain or other complaints.   Past Medical History  Diagnosis Date  . Thyroid disease   . Kidney stones     laser surgery in the past  . COPD (chronic obstructive pulmonary disease) ([place])     followed by Dr.Fleming  . Arthritis     uses celebrex  . Sleep apnea     CPAP 7?  . Abdominal pain     Resolved,MRSA and yeast likely colonization in setting of multiple antibiotics.  . Vitamin D deficiency 07-11-2010    drisdol 50,000 units 1 weekly x 12 weeks no refill active  . Hypertension 07/30/2012  . Varicose veins  09/05/2015  . Benign essential HTN 03/02/2015  . Heart valve disease 10/17/2015  . Leg varices 04/25/2014    Patient Active Problem List   Diagnosis Date Noted  . Edema leg 10/17/2015  . Heart valve disease 10/17/2015  . 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 Urinary frequency 09/19/2015  . Varicose veins 09/05/2015  . Abdominal wall cramping 09/05/2015  . Benign essential HTN 03/02/2015  . Obstructive apnea 08/24/2014  . Severe obesity (BMI >= 40) ([place]) 06/29/2014  . Leg varices 04/25/2014  . Microscopic hematuria 04/06/2014  . Diabetes type 2, controlled ([place]) 06/16/2013  . Medicare annual wellness visit, subsequent 06/16/2013  . COPD (chronic obstructive pulmonary disease) ([place]) 03/21/2013  . Obesity 03/11/2013  . Breath shortness 03/11/2013  . Calculus of kidney 08/04/2012  . Urge incontinence of urine 08/04/2012  . Hypertension 07/30/2012  . Arthritis 09/04/2011  . Hypothyroidism 07/02/2011  . GERD (gastroesophageal reflux disease) 07/02/2011    Past Surgical History  Procedure Laterality Date  . Straighten nasal septum      Dr.McQueen  . Laser surgery for kidney stones      followed by Dr.Humphries  . Vein surgery      Dr.Schnier  . Cataract extraction Bilateral     Dr. Rosana Berger  . Tonsillectomy      Current Outpatient Rx  Name  Route  Sig  Dispense  Refill  . albuterol (VENTOLIN HFA) 108 (90 BASE) MCG/ACT inhaler   Inhalation   Inhale 2 puffs into  the lungs daily.   36 g   12   . benzonatate (TESSALON) 200 MG capsule   Oral   Take 1 capsule (200 mg total) by mouth 2 (two) times daily as needed for cough.   30 capsule   0   . Blood Glucose Calibration (ACCU-CHEK AVIVA) SOLN   In Vitro   1 each by In Vitro route once.   1 each   2   . Blood Glucose Monitoring Suppl (ONE TOUCH ULTRA SYSTEM KIT) W/DEVICE KIT   Does not apply   1 kit by Does not apply route once. Use as directed. Test blood sugars 1-2 times daily.   1 each    0   . cetirizine (ZYRTEC ALLERGY) 10 MG tablet   Oral   Take 1 tablet (10 mg total) by mouth daily.   90 tablet   12   . diclofenac sodium (VOLTAREN) 1 % GEL   Topical   Apply 1 application topically 2 (two) times daily as needed.           . doxycycline (VIBRA-TABS) 100 MG tablet   Oral   Take 1 tablet (100 mg total) by mouth 2 (two) times daily.   14 tablet   0   . Fluticasone-Salmeterol (ADVAIR DISKUS) 250-50 MCG/DOSE AEPB   Inhalation   Inhale 1 puff into the lungs 2 (two) times daily.   180 each   11   . furosemide (LASIX) 20 MG tablet   Oral   Take 1 tablet (20 mg total) by mouth daily as needed.   90 tablet   4   . gentamicin ointment (GARAMYCIN) 0.1 %   Topical   Apply 1 application topically 3 (three) times daily.   30 g   3   . glucose blood test strip   Other   1 each by Other route as needed for other. Use as instructed with One Touch Ultra Glucometer. Test blood sugars at home 1-2 times daily   100 each   3   . hydrocortisone 2.5 % cream      APPLY TO THE AFFECTED ON FACE BID PRN.      4   . Lancets MISC      Use with One Touch Ultra Test blood sugars 1-2 times daily.   100 each   3   . levothyroxine (SYNTHROID, LEVOTHROID) 75 MCG tablet   Oral   Take 1 tablet (75 mcg total) by mouth daily.   90 tablet   3   . mirabegron ER (MYRBETRIQ) 50 MG TB24 tablet   Oral   Take 50 mg by mouth daily.         Marland Kitchen nystatin (MYCOSTATIN/NYSTOP) 100000 UNIT/GM POWD      Apply twice daily as needed   60 g   3   . pantoprazole (PROTONIX) 40 MG tablet   Oral   Take 1 tablet (40 mg total) by mouth 2 (two) times daily.   180 tablet   3   . predniSONE (DELTASONE) 20 MG tablet   Oral   Take 2 tablets (40 mg total) by mouth daily with breakfast.   10 tablet   0    Allergies Codeine; Penicillins; and Sulfa antibiotics  Family History  Problem Relation Age of Onset  . Peripheral vascular disease Mother   . Heart disease Mother   . COPD  Mother   . Dementia Father   . ALS Brother   . Heart disease Brother  s/p CABG    Social History Social History  Substance Use Topics  . Smoking status: Never Smoker   . Smokeless tobacco: Never Used  . Alcohol Use: No    Review of Systems Constitutional: No fever/chills Eyes: No visual changes. ENT: No sore throat. Positive runny nose, cough and congestion. Cardiovascular: Denies chest pain. Respiratory: Positive shortness of breath as above. Gastrointestinal: No abdominal pain.  No nausea, no vomiting.  No diarrhea.  No constipation. Genitourinary: Negative for dysuria. Musculoskeletal: Negative for back pain. Skin: Negative for rash. Neurological: Negative for headaches, focal weakness or numbness.  10-point ROS otherwise negative.  ____________________________________________   PHYSICAL EXAM:  VITAL SIGNS: ED Triage Vitals  Enc Vitals Group     BP 10/26/15 1754 175/74 mmHg     Pulse Rate 10/26/15 1754 76     Resp 10/26/15 1754 28     Temp 10/26/15 1754 97.7 F (36.5 C)     Temp Source 10/26/15 1754 Oral     SpO2 10/26/15 1754 98 %     Weight 10/26/15 1754 267 lb (121.11 kg)     Height 10/26/15 1754 '5\' 7"'$  (1.702 m)     Head Cir --      Peak Flow --      Pain Score --      Pain Loc --      Pain Edu? --      Excl. in [place]? --     Constitutional: Alert and oriented. Well appearing and in no acute distress. Eyes: Conjunctivae are normal. PERRL. EOMI. Head: Atraumatic. No sinus tenderness to palpation. No swelling. No erythema.  Ears: no erythema, normal TMs bilaterally.   Nose: Mild nasal congestion with clear rhinorrhea.  Mouth/Throat: Mucous membranes are moist.  Oropharynx non-erythematous. No tonsillar swelling or exudate. Neck: No stridor.  No cervical spine tenderness to palpation. Hematological/Lymphatic/Immunilogical: No cervical lymphadenopathy. Cardiovascular: Normal rate, regular rhythm. Grossly normal heart sounds.  Good peripheral  circulation. Respiratory: Slightly increased respiratory effort. No retractions. Speaks in complete sentences. Inspiratory and expiratory wheezes throughout with scattered rhonchi. Intermittent dry cough in room.  Gastrointestinal: Soft and nontender. Obese abdomen. Normal Bowel sounds.   Musculoskeletal: No lower or upper extremity tenderness nor edema.Bilateral pedal pulses equal and easily palpated. No calf tenderness.  Neurologic:  Normal speech and language. No gross focal neurologic deficits are appreciated. No gait instability. Skin:  Skin is warm, dry and intact. No rash noted. Psychiatric: Mood and affect are normal. Speech and behavior are normal.  ____________________________________________   LABS (all labs ordered are listed, but only abnormal results are displayed)  Labs Reviewed - No data to display ____________________________________________   RADIOLOGY  CHEST 2 VIEW  COMPARISON: 06/18/2014  FINDINGS: Cardiomediastinal contours are unchanged with mild cardiomegaly. Chronic interstitial prominence is unchanged. Pulmonary vasculature is normal. No consolidation, pleural effusion, or pneumothorax. No acute osseous abnormalities are seen.  IMPRESSION: Stable mild cardiomegaly and interstitial prominence. No superimposed acute process.   Electronically Signed By: Jeb Levering M.D. On: 10/26/2015 19:03  I, Marylene Land, personally viewed and evaluated these images (plain radiographs) as part of my medical decision making.     INITIAL IMPRESSION / ASSESSMENT AND PLAN / ED COURSE  Pertinent labs & imaging results that were available during my care of the patient were reviewed by me and considered in my medical decision making (see chart for details).  Discussed patient and plan of care with Dr. Zenda Alpers.   Well-appearing patient. No current respiratory distress. Presents for  the complaints of increased wheezes with intermittent shortness of breath  associated with wheezes and coughing. Patient reports runny nose, cough and congestion throughout this week.Denies chest pain.  Patient currently taking oral doxycycline as well as prednisone 40 mg for the same. Reports did take prednisone today. Denies known associated triggers. Reports has used albuterol inhaler at home with minimal improvement. Inspiratory and expiratory wheezes throughout wtih scattered rhonchi. Will evaluate chest x-ray as well as proceed with albuterol nebs. Counseled regarding with patient if continues with wheezing or shortness of breath complaints will direct patient to emergency room for further evaluation and possible admission. Oxygenation remained stable with ambulation in hallway.  Patient reassessed. Denies current shortness of breath, wheezes slightly improved but continues with wheezes.   Chest x-ray reviewed. Chest x-ray stable mild cardiomegaly and interstitial prominence,and pros acute process. Post 2 albuterol ipratropium nebulizer treatments, patient reports no improvement. Patient reassessed,continues with considering an expiratory wheezes throughout, rhonchi slightly improved, dry occasional cough in room. Oxygenation saturation at rest 99%, ambulatory oxygenation oxygenation 93-97%.  Denies shortness of breath at rest. Patient does admit to mild shortness of breath with ambulation in hallway. Denies chest pain. Denies other complaints.  Patient is well appearing however with continued shortness of breath and wheezing post albuterol treatments as well as being treated outpatient only with oral prednisone as well as doxycycline considered patient failed outpatient therapy for COPD exacerbation. Discussed in detail with patient, per patient be further evaluated and treated in emergency room of her choice. Patient alert and oriented with decisional capacity and states that she prefers to go by private vehicle. Refuses EMS transfer. Patient called her son and son to  transport patient to [facility]. Raquel Sarna CMA called report. Patient stable at the time of discharge. Patient reports that she will go directly to the emergency room.  Discussed follow up with Primary care physician this week. Discussed follow up and return parameters including no resolution or any worsening concerns. Patient verbalized understanding and agreed to plan.   ____________________________________________   FINAL CLINICAL IMPRESSION(S) / ED DIAGNOSES  Final diagnoses:  COPD exacerbation ([place])  Shortness of breath       Marylene Land, NP 10/26/15 2017

## 2015-10-26 NOTE — Telephone Encounter (Signed)
Patient wants to know if she should go to Urgent care for svn she is wheezing . Audible on phone.

## 2015-10-26 NOTE — Discharge Instructions (Signed)
As discussed go directly to the emergency room of your choice.  Follow-up closely with her primary care physician as well as pulmonologist. Return to urgent care as needed for new or worsening concerns.  Chronic Obstructive Pulmonary Disease Exacerbation Chronic obstructive pulmonary disease (COPD) is a common lung condition in which airflow from the lungs is limited. COPD is a general term that can be used to describe many different lung problems that limit airflow, including chronic bronchitis and emphysema. COPD exacerbations are episodes when breathing symptoms become much worse and require extra treatment. Without treatment, COPD exacerbations can be life threatening, and frequent COPD exacerbations can cause further damage to your lungs. CAUSES  Respiratory infections.  Exposure to smoke.  Exposure to air pollution, chemical fumes, or dust. Sometimes there is no apparent cause or trigger. RISK FACTORS  Smoking cigarettes.  Older age.  Frequent prior COPD exacerbations. SIGNS AND SYMPTOMS  Increased coughing.  Increased thick spit (sputum) production.  Increased wheezing.  Increased shortness of breath.  Rapid breathing.  Chest tightness. DIAGNOSIS Your medical history, a physical exam, and tests will help your health care provider make a diagnosis. Tests may include:  A chest X-ray.  Basic lab tests.  Sputum testing.  An arterial blood gas test. TREATMENT Depending on the severity of your COPD exacerbation, you may need to be admitted to a hospital for treatment. Some of the treatments commonly used to treat COPD exacerbations are:   Antibiotic medicines.  Bronchodilators. These are drugs that expand the air passages. They may be given with an inhaler or nebulizer. Spacer devices may be needed to help improve drug delivery.  Corticosteroid medicines.  Supplemental oxygen therapy.  Airway clearing techniques, such as noninvasive ventilation (NIV) and  positive expiratory pressure (PEP). These provide respiratory support through a mask or other noninvasive device. HOME CARE INSTRUCTIONS  Do not smoke. Quitting smoking is very important to prevent COPD from getting worse and exacerbations from happening as often.  Avoid exposure to all substances that irritate the airway, especially to tobacco smoke.  If you were prescribed an antibiotic medicine, finish it all even if you start to feel better.  Take all medicines as directed by your health care provider.It is important to use correct technique with inhaled medicines.  Drink enough fluids to keep your urine clear or pale yellow (unless you have a medical condition that requires fluid restriction).  Use a cool mist vaporizer. This makes it easier to clear your chest when you cough.  If you have a home nebulizer and oxygen, continue to use them as directed.  Maintain all necessary vaccinations to prevent infections.  Exercise regularly.  Eat a healthy diet.  Keep all follow-up appointments as directed by your health care provider. SEEK IMMEDIATE MEDICAL CARE IF:  You have worsening shortness of breath.  You have trouble talking.  You have severe chest pain.  You have blood in your sputum.  You have a fever.  You have weakness, vomit repeatedly, or faint.  You feel confused.  You continue to get worse. MAKE SURE YOU:  Understand these instructions.  Will watch your condition.  Will get help right away if you are not doing well or get worse.   This information is not intended to replace advice given to you by your health care provider. Make sure you discuss any questions you have with your health care provider.   Document Released: 08/24/2007 Document Revised: 11/17/2014 Document Reviewed: 07/01/2013 Elsevier Interactive Patient Education [employer].  Shortness of Breath Shortness of breath means you have trouble breathing. Shortness of breath needs  medical care right away. HOME CARE   Do not smoke.  Avoid being around chemicals or things (paint fumes, dust) that may bother your breathing.  Rest as needed. Slowly begin your normal activities.  Only take medicines as told by your doctor.  Keep all doctor visits as told. GET HELP RIGHT AWAY IF:   Your shortness of breath gets worse.  You feel lightheaded, pass out (faint), or have a cough that is not helped by medicine.  You cough up blood.  You have pain with breathing.  You have pain in your chest, arms, shoulders, or belly (abdomen).  You have a fever.  You cannot walk up stairs or exercise the way you normally do.  You do not get better in the time expected.  You have a hard time doing normal activities even with rest.  You have problems with your medicines.  You have any new symptoms. MAKE SURE YOU:  Understand these instructions.  Will watch your condition.  Will get help right away if you are not doing well or get worse.   This information is not intended to replace advice given to you by your health care provider. Make sure you discuss any questions you have with your health care provider.   Document Released: 04/14/2008 Document Revised: 11/01/2013 Document Reviewed: 01/12/2012 Elsevier Interactive Patient Education [employer].

## 2015-10-26 NOTE — ED Notes (Signed)
Pt saw Pulmonology on Monday, and was diagnosed with bronchitis. Pt was prescribed Doxycycline 100 mg BID x 7 days, and a 6 day Prednisone taper. Pt also uses Advair 1 puff BID, and an albuterol rescue inhaler which she uses PRN. Pt admits she did not use rescue inhaler today. Pt has a H/O asthma and COPD, denies H/O CHF.

## 2015-10-26 NOTE — Telephone Encounter (Signed)
Pt called stating that she is still taking Prednisone and Doxycycline. She is wheezing with some SOB. Informed pt to go to UC or ER for further treatment. Pt agreed. Nothing further needed.

## 2015-10-27 DIAGNOSIS — J4531 Mild persistent asthma with (acute) exacerbation: Secondary | ICD-10-CM | POA: Insufficient documentation

## 2015-10-27 DIAGNOSIS — J441 Chronic obstructive pulmonary disease with (acute) exacerbation: Secondary | ICD-10-CM | POA: Diagnosis not present

## 2015-10-28 DIAGNOSIS — J4531 Mild persistent asthma with (acute) exacerbation: Secondary | ICD-10-CM | POA: Diagnosis not present

## 2015-10-28 DIAGNOSIS — E119 Type 2 diabetes mellitus without complications: Secondary | ICD-10-CM | POA: Diagnosis not present

## 2015-10-28 DIAGNOSIS — G4733 Obstructive sleep apnea (adult) (pediatric): Secondary | ICD-10-CM | POA: Diagnosis not present

## 2015-10-28 DIAGNOSIS — I160 Hypertensive urgency: Secondary | ICD-10-CM | POA: Diagnosis not present

## 2015-10-28 DIAGNOSIS — E039 Hypothyroidism, unspecified: Secondary | ICD-10-CM | POA: Diagnosis not present

## 2015-10-29 ENCOUNTER — Telehealth: Payer: Self-pay | Admitting: *Deleted

## 2015-10-29 ENCOUNTER — Telehealth: Payer: Self-pay

## 2015-10-29 ENCOUNTER — Telehealth: Payer: Self-pay | Admitting: Internal Medicine

## 2015-10-29 MED ORDER — NYSTATIN 100000 UNIT/ML MT SUSP: 5.0000 mL | Freq: Four times a day (QID) | OROMUCOSAL | Status: DC

## 2015-10-29 NOTE — Telephone Encounter (Signed)
TCM call/appt made

## 2015-10-29 NOTE — Telephone Encounter (Signed)
Patient discharged from [facility] 10/28/15. She was admitted for Asthma and bronchitis. Patient should be seen within 5-10 day. please advise a place on the schedule to place patient

## 2015-10-29 NOTE — Telephone Encounter (Signed)
Please schedule patient per Dr. Derry Skill comments

## 2015-10-29 NOTE — Telephone Encounter (Signed)
OK. I have called this in.

## 2015-10-29 NOTE — Telephone Encounter (Signed)
Discharged from [facility] she states she has thrush and wants mouth wash called into the pharmacy.

## 2015-10-29 NOTE — Telephone Encounter (Signed)
Tuesday at 1pm for 85min

## 2015-10-29 NOTE — Telephone Encounter (Signed)
Transition Care Management Follow-up Telephone Call   Date discharged? 10/28/15   How have you been since you were released from the hospital? Not as much wheezing, coughing with mucus, some congestion, fatigued.   Do you understand why you were in the hospital? Yes, I had an asthma attack.   Do you understand the discharge instructions? Yes, no problems.  Moves slowly between activities.   Where were you discharged to? Home   Items Reviewed:  Medications reviewed: Yes, no problems.  Started, stopped and continuing scheduled medications as directed.  Allergies reviewed: Yes, no changes  Dietary changes reviewed: Yes, no changes  Referrals reviewed: Yes, pulmonary and dermatology appointment scheduled   Functional Questionnaire:   Activities of Daily Living (ADLs):   She states they are independent in the following: Dressing, bathing, meal prep.   States they require assistance with the following: Ambulates with cane.  Adult children helps when needed.   Any transportation issues/concerns?: No   Any patient concerns? Requests handicap paperwork be completed.   Confirmed importance and date/time of follow-up visits scheduled Yes, appointment made 10/30/15.  Provider Appointment booked with Dr. Gilford Rile (PCP).  Confirmed with patient if condition begins to worsen call PCP or go to the ER.  Patient was given the office number and encouraged to call back with question or concerns.  : Yes, patient verbalized understanding.

## 2015-10-30 ENCOUNTER — Encounter: Payer: Self-pay | Admitting: Internal Medicine

## 2015-10-30 ENCOUNTER — Ambulatory Visit (INDEPENDENT_AMBULATORY_CARE_PROVIDER_SITE_OTHER): Payer: Medicare Other | Admitting: Internal Medicine

## 2015-10-30 VITALS — BP 136/80 | HR 77 | Temp 97.7°F | Ht 67.0 in | Wt 264.4 lb

## 2015-10-30 DIAGNOSIS — J441 Chronic obstructive pulmonary disease with (acute) exacerbation: Secondary | ICD-10-CM | POA: Diagnosis not present

## 2015-10-30 DIAGNOSIS — E118 Type 2 diabetes mellitus with unspecified complications: Secondary | ICD-10-CM

## 2015-10-30 DIAGNOSIS — B370 Candidal stomatitis: Secondary | ICD-10-CM

## 2015-10-30 MED ORDER — FLUCONAZOLE 100 MG PO TABS: 100.0000 mg | ORAL_TABLET | Freq: Every day | ORAL | Status: DC

## 2015-10-30 NOTE — Patient Instructions (Addendum)
Continue course of Levaquin and Prednisone.  Please monitor blood sugars carefully, once or twice daily, over the next few weeks. Call if blood sugars are consistently over 250.  Start Fluconazole 100mg  daily for 7 days to help with thrush.  Follow up in 2 weeks or sooner as needed.

## 2015-10-30 NOTE — Assessment & Plan Note (Signed)
Symptoms improving. Continue Levaquin and Prednisone at home to complete 5 day course. Continue Albuterol prn. Follow up in 4 weeks.

## 2015-10-30 NOTE — Progress Notes (Signed)
Subjective:    Patient ID: Darlene Robbins, female    DOB: Aug 24, 1941, 74 y.o.   MRN: [id number]  HPI  74YO female presents for hospital follow up.  ADMITTED: 10/26/2015 DISCHARGED: 10/28/2015  DIAGNOSIS: COPD Exacerbation.  Presented to [facility] ED with worsening dyspnea. She had been on Doxycycline and oral Prednisone but had progressive shortness of breath. She was placed on Levaquin and Solumedrol with scheduled albuterol. Her symptoms improved and she was discharged on 5 days of Prednisone and Levaquin. During admission, she was noted to have some elevated BG in the 500s.  Continues to have some fatigue, since discharge. Cough has improved some. Taking Tessalon as needed for cough. No fever, chills, chest pain.  Started on Nystatin to help with thrush. Notes some improvement with this.  DM - BG have been improved at home. Less than 500. Compliant with metformin.   Wt Readings from Last 3 Encounters:  10/30/15 264 lb 6 oz (119.92 kg)  10/26/15 267 lb (121.11 kg)  10/22/15 269 lb 3.2 oz (122.108 kg)   BP Readings from Last 3 Encounters:  10/30/15 136/80  10/26/15 168/63  10/22/15 122/68    Past Medical History  Diagnosis Date  . Thyroid disease   . Kidney stones     laser surgery in the past  . COPD (chronic obstructive pulmonary disease) ([place])     followed by Dr.Fleming  . Arthritis     uses celebrex  . Sleep apnea     CPAP 7?  . Abdominal pain     Resolved,MRSA and yeast likely colonization in setting of multiple antibiotics.  . Vitamin D deficiency 07-11-2010    drisdol 50,000 units 1 weekly x 12 weeks no refill active  . Hypertension 07/30/2012  . Varicose veins 09/05/2015  . Benign essential HTN 03/02/2015  . Heart valve disease 10/17/2015  . Leg varices 04/25/2014   Family History  Problem Relation Age of Onset  . Peripheral vascular disease Mother   . Heart disease Mother   . COPD Mother   . Dementia Father   . ALS Brother   . Heart disease  Brother     s/p CABG   Past Surgical History  Procedure Laterality Date  . Straighten nasal septum      Dr.McQueen  . Laser surgery for kidney stones      followed by Dr.Humphries  . Vein surgery      Dr.Schnier  . Cataract extraction Bilateral     Dr. Rosana Berger  . Tonsillectomy     Social History   Social History  . Marital Status: Widowed    Spouse Name: N/A  . Number of Children: N/A  . Years of Education: N/A   Social History Main Topics  . Smoking status: Never Smoker   . Smokeless tobacco: Never Used  . Alcohol Use: No  . Drug Use: No  . Sexual Activity: Not Asked   Other Topics Concern  . None   Social History Narrative   Lives with son.    Review of Systems  Constitutional: Positive for fatigue. Negative for fever, chills and unexpected weight change.  HENT: Negative for congestion, ear discharge, ear pain, facial swelling, hearing loss, mouth sores, nosebleeds, postnasal drip, rhinorrhea, sinus pressure, sneezing, sore throat, tinnitus, trouble swallowing and voice change.   Eyes: Negative for pain, discharge, redness and visual disturbance.  Respiratory: Positive for cough and shortness of breath. Negative for chest tightness, wheezing and stridor.   Cardiovascular: Negative for chest  pain, palpitations and leg swelling.  Musculoskeletal: Negative for myalgias, arthralgias, neck pain and neck stiffness.  Skin: Negative for color change and rash.  Neurological: Negative for dizziness, weakness, light-headedness and headaches.  Hematological: Negative for adenopathy.       Objective:    BP 136/80 mmHg  Pulse 77  Temp(Src) 97.7 F (36.5 C) (Oral)  Ht 5\' 7"  (1.702 m)  Wt 264 lb 6 oz (119.92 kg)  BMI 41.40 kg/m2  SpO2 96% Physical Exam  Constitutional: She is oriented to person, place, and time. She appears well-developed and well-nourished. No distress.  HENT:  Head: Normocephalic and atraumatic.  Right Ear: External ear normal.  Left Ear: External  ear normal.  Nose: Nose normal.  Mouth/Throat: Oropharyngeal exudate (small amount white exudate on soft palate) and posterior oropharyngeal erythema present.  Eyes: Conjunctivae are normal. Pupils are equal, round, and reactive to light. Right eye exhibits no discharge. Left eye exhibits no discharge. No scleral icterus.  Neck: Normal range of motion. Neck supple. No tracheal deviation present. No thyromegaly present.  Cardiovascular: Normal rate, regular rhythm, normal heart sounds and intact distal pulses.  Exam reveals no gallop and no friction rub.   No murmur heard. Pulmonary/Chest: Effort normal and breath sounds normal. No accessory muscle usage. No tachypnea. No respiratory distress. She has no decreased breath sounds. She has no wheezes. She has no rhonchi. She has no rales. She exhibits no tenderness.  Musculoskeletal: Normal range of motion. She exhibits no edema or tenderness.  Lymphadenopathy:    She has no cervical adenopathy.  Neurological: She is alert and oriented to person, place, and time. No cranial nerve deficit. She exhibits normal muscle tone. Coordination normal.  Skin: Skin is warm and dry. No rash noted. She is not diaphoretic. No erythema. No pallor.  Psychiatric: She has a normal mood and affect. Her behavior is normal. Judgment and thought content normal.          Assessment & Plan:   Problem List Items Addressed This Visit      High   Diabetes type 2, controlled ([place])    BG elevated during recent admission. Will monitor BG closely. Continue Metformin.      Relevant Medications   metFORMIN (GLUCOPHAGE) 500 MG tablet     Unprioritized   COPD exacerbation ([place]) - Primary    Symptoms improving. Continue Levaquin and Prednisone at home to complete 5 day course. Continue Albuterol prn. Follow up in 4 weeks.      Thrush    Start Fluconazole 100mg  daily for 7 days.      Relevant Medications   fluconazole (DIFLUCAN) 100 MG tablet       Return in  about 2 weeks (around 11/13/2015) for Recheck.

## 2015-10-30 NOTE — Assessment & Plan Note (Signed)
BG elevated during recent admission. Will monitor BG closely. Continue Metformin.

## 2015-10-30 NOTE — Assessment & Plan Note (Signed)
Start Fluconazole 100mg  daily for 7 days.

## 2015-10-30 NOTE — Progress Notes (Signed)
Pre visit review using our clinic review tool, if applicable. No additional management support is needed unless otherwise documented below in the visit note. 

## 2015-11-07 ENCOUNTER — Encounter: Payer: Self-pay | Admitting: *Deleted

## 2015-11-20 ENCOUNTER — Ambulatory Visit: Payer: Medicare Other | Admitting: Internal Medicine

## 2015-11-23 ENCOUNTER — Encounter: Payer: Self-pay | Admitting: Urology

## 2015-11-23 ENCOUNTER — Ambulatory Visit (INDEPENDENT_AMBULATORY_CARE_PROVIDER_SITE_OTHER): Payer: Medicare Other | Admitting: Urology

## 2015-11-23 VITALS — BP 174/76 | HR 85 | Ht 67.0 in | Wt 264.9 lb

## 2015-11-23 DIAGNOSIS — N3941 Urge incontinence: Secondary | ICD-10-CM | POA: Diagnosis not present

## 2015-11-23 LAB — BLADDER SCAN AMB NON-IMAGING: SCAN RESULT: 18

## 2015-11-23 LAB — MICROSCOPIC EXAMINATION: RBC, UA: NONE SEEN /hpf (ref 0–?)

## 2015-11-23 LAB — URINALYSIS, COMPLETE
Bilirubin, UA: NEGATIVE
Glucose, UA: NEGATIVE
Ketones, UA: NEGATIVE
NITRITE UA: NEGATIVE
PH UA: 5 (ref 5.0–7.5)
PROTEIN UA: NEGATIVE
RBC, UA: NEGATIVE
Specific Gravity, UA: 1.02 (ref 1.005–1.030)
UUROB: 0.2 mg/dL (ref 0.2–1.0)

## 2015-11-23 MED ORDER — MIRABEGRON ER 50 MG PO TB24: 50.0000 mg | ORAL_TABLET | Freq: Every day | ORAL | Status: DC

## 2015-11-23 NOTE — Progress Notes (Signed)
11/23/2015 9:51 AM   Darlene Robbins 1941/04/22 [id number]  Referring provider: [name], MD [address] [place], [place] [address]  Chief Complaint  Patient presents with  . Urinary Incontinence    39month   HPI:  Expand All Collapse All     10/17/2015 2:02 PM   Darlene [id number] Referring provider: JJackolyn Confer MD [address] [id number] Chief Complaint  Patient presents with  . Bladder Prolapse    new pt     HPI: The patient is a 75year old woman who has mixed stress urge incontinence. I believe the urge component is more significant. She sometimes the cough is easy without any lifting. 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 She'll were 3-6 pads a day moderately wet or sometimes soaking   She gets up to 3 times a night and voids every 2 archer the daytime. She's had lithotripsy in the past and likely ureteroscopy. She has rare urinary tract infections. She's not had bladder surgery. She is a borderline diabetic. She has no neurologic retractor symptoms. She's not had hysterectomy. Oxybutynin is failed. Bowel movements are normal       The patient is here in follow-up on myrbetriq.    PMH: Past Medical History  Diagnosis Date  . Thyroid disease   . Kidney stones     laser surgery in the past  . COPD [place] obstructive pulmonary disease) [place]     followed by Dr.Fleming  . Arthritis     uses celebrex  . Sleep apnea     CPAP 7?  . Abdominal pain     Resolved,MRSA and yeast likely colonization in setting of multiple antibiotics.  . Vitamin D deficiency 07-11-2010    drisdol 50,000 units 1 weekly x 12 weeks no refill active  . Hypertension 07/30/2012  . Varicose veins 09/05/2015  . Benign essential HTN 03/02/2015  . Heart valve disease 10/17/2015  . Leg varices 04/25/2014    Surgical History: Past Surgical History    Procedure Laterality Date  . Straighten nasal septum      Dr.McQueen  . Laser surgery for kidney stones      followed by Dr.Humphries  . Vein surgery      Dr.Schnier  . Cataract extraction Bilateral     Dr. MRosana Berger . Tonsillectomy      Home Medications:    Medication List       This list is accurate as of: 11/23/15  9:51 AM.  Always use your most recent med list.               ACCU-CHEK AVIVA Soln  1 each by In Vitro route once.     albuterol 108 (90 Base) MCG/ACT inhaler  Commonly known as:  VENTOLIN HFA  Inhale 2 puffs into the lungs daily.     benzonatate 200 MG capsule  Commonly known as:  TESSALON  Take 1 capsule (200 mg total) by mouth 2 (two) times daily as needed for cough.     cetirizine 10 MG tablet  Commonly known as:  ZYRTEC ALLERGY  Take 1 tablet (10 mg total) by mouth daily.     diclofenac sodium 1 % Gel  Commonly known as:  VOLTAREN  Apply 1 application topically 2 (two) times daily as needed.     fluconazole 100 MG tablet  Commonly known as:  DIFLUCAN  Take 1 tablet (100  mg total) by mouth daily.     fluorouracil 5 % cream  Commonly known as:  EFUDEX  APP AA BID FOR 4 WEEKS     Fluticasone-Salmeterol 250-50 MCG/DOSE Aepb  Commonly known as:  ADVAIR DISKUS  Inhale 1 puff into the lungs 2 (two) times daily.     furosemide 20 MG tablet  Commonly known as:  LASIX  Take 1 tablet (20 mg total) by mouth daily as needed.     gentamicin ointment 0.1 %  Commonly known as:  GARAMYCIN  Apply 1 application topically 3 (three) times daily.     glucose blood test strip  1 each by Other route as needed for other. Use as instructed with One Touch Ultra Glucometer. Test blood sugars at home 1-2 times daily     hydrocortisone 2.5 % cream  APPLY TO THE AFFECTED ON FACE BID PRN.     Lancets Misc  Use with One Touch Ultra Test blood sugars 1-2 times daily.     levothyroxine 75 MCG tablet  Commonly known as:  SYNTHROID, LEVOTHROID  Take 1 tablet (75  mcg total) by mouth daily.     metFORMIN 500 MG tablet  Commonly known as:  GLUCOPHAGE  Take 500 mg by mouth.     MYRBETRIQ 50 MG Tb24 tablet  Generic drug:  mirabegron ER  Take 50 mg by mouth daily.     nystatin 100000 UNIT/GM Powd  Apply twice daily as needed     nystatin 100000 UNIT/ML suspension  Commonly known as:  MYCOSTATIN  Take 5 mLs (500,000 Units total) by mouth 4 (four) times daily.     ONE TOUCH ULTRA SYSTEM KIT w/Device Kit  1 kit by Does not apply route once. Use as directed. Test blood sugars 1-2 times daily.     pantoprazole 40 MG tablet  Commonly known as:  PROTONIX  Take 1 tablet (40 mg total) by mouth 2 (two) times daily.     predniSONE 20 MG tablet  Commonly known as:  DELTASONE  Take 2 tablets (40 mg total) by mouth daily with breakfast.        Allergies:  Allergies  Allergen Reactions  . Cephalexin Other (See Comments)  . Codeine   . Penicillins   . Sulfa Antibiotics     Family History: Family History  Problem Relation Age of Onset  . Peripheral vascular disease Mother   . Heart disease Mother   . COPD Mother   . Dementia Father   . ALS Brother   . Heart disease Brother     s/p CABG    Social History:  reports that she has never smoked. She has never used smokeless tobacco. She reports that she does not drink alcohol or use illicit drugs.  ROS: UROLOGY Frequent Urination?: Yes Hard to postpone urination?: No Burning/pain with urination?: No Get up at night to urinate?: Yes Leakage of urine?: Yes Urine stream starts and stops?: No Trouble starting stream?: No Do you have to strain to urinate?: No Blood in urine?: No Urinary tract infection?: No Sexually transmitted disease?: No Injury to kidneys or bladder?: No Painful intercourse?: No Weak stream?: No Currently pregnant?: No Vaginal bleeding?: No Last menstrual period?: 1992  Gastrointestinal Nausea?: No Vomiting?: No Indigestion/heartburn?: Yes Diarrhea?:  No Constipation?: No  Constitutional Fever: No Night sweats?: No Weight loss?: No Fatigue?: No  Skin Skin rash/lesions?: No Itching?: No  Eyes Blurred vision?: No Double vision?: No  Ears/Nose/Throat Sore throat?: Yes Sinus problems?:  No  Hematologic/Lymphatic Swollen glands?: No Easy bruising?: No  Cardiovascular Leg swelling?: No Chest pain?: No  Respiratory Cough?: No Shortness of breath?: Yes  Endocrine Excessive thirst?: No  Musculoskeletal Back pain?: No Joint pain?: No  Neurological Headaches?: No Dizziness?: No  Psychologic Depression?: No Anxiety?: No  Physical Exam: BP 174/76 mmHg  Pulse 85  Ht '5\' 7"'  (1.702 m)  Wt 120.158 kg (264 lb 14.4 oz)  BMI 41.48 kg/m2    Laboratory Data: Lab Results  Component Value Date   WBC 9.4 09/05/2015   HGB 14.2 09/05/2015   HCT 42.8 09/05/2015   MCV 91.3 09/05/2015   PLT 177.0 09/05/2015    Lab Results  Component Value Date   CREATININE 0.88 09/05/2015    No results found for: PSA  No results found for: TESTOSTERONE  Lab Results  Component Value Date   HGBA1C 7.7* 09/19/2015    Urinalysis    Component Value Date/Time   COLORURINE YELLOW 04/06/2014 1348   COLORURINE [place] 11/17/2011 1418   [place] 04/06/2014 1348   APPEARANCEUR Clear 11/17/2011 1418   LABSPEC >=1.030* 04/06/2014 1348   LABSPEC 1.008 11/17/2011 1418   PHURINE 5.5 04/06/2014 1348   PHURINE 6.0 11/17/2011 1418   GLUCOSEU Negative 10/17/2015 1351   GLUCOSEU NEGATIVE 04/06/2014 1348   GLUCOSEU Negative 11/17/2011 [place] 04/06/2014 1348   HGBUR Negative 11/17/2011 1418   BILIRUBINUR Negative 10/17/2015 1351   BILIRUBINUR neg 09/05/2015 1324   BILIRUBINUR NEGATIVE 04/06/2014 1348   BILIRUBINUR Negative 11/17/2011 1418   KETONESUR NEGATIVE 04/06/2014 1348   KETONESUR Trace 11/17/2011 1418   PROTEINUR neg 09/05/2015 1324   PROTEINUR Negative 11/17/2011 1418   UROBILINOGEN 0.2 09/05/2015  1324   UROBILINOGEN 0.2 04/06/2014 1348   NITRITE Negative 10/17/2015 1351   NITRITE neg 09/05/2015 1324   NITRITE NEGATIVE 04/06/2014 1348   NITRITE Negative 11/17/2011 1418   LEUKOCYTESUR Trace* 10/17/2015 1351   LEUKOCYTESUR Negative 09/05/2015 1324   LEUKOCYTESUR Negative 11/17/2011 1418   Bladder scan residual was 17 mL   Assessment & Plan:  Clinically the patient is doing beautifully. She no longer has enuresis but especially foot on the floor syndrome. She's going less frequently. She very pleased.  1. Urge incontinence of urine 2. Urinary frequency  Plan: The patient was given more myrbetriq. Prescription sent to mail order. 90 tablets 3. See in 4 months and then annually    Reece Packer, MD  [facility] [address], [place], [place] [address] [phone]

## 2015-11-28 ENCOUNTER — Encounter: Payer: Self-pay | Admitting: Internal Medicine

## 2015-11-28 ENCOUNTER — Ambulatory Visit (INDEPENDENT_AMBULATORY_CARE_PROVIDER_SITE_OTHER): Payer: Medicare Other | Admitting: Internal Medicine

## 2015-11-28 VITALS — BP 136/74 | HR 88 | Ht 67.0 in | Wt 266.4 lb

## 2015-11-28 DIAGNOSIS — J449 Chronic obstructive pulmonary disease, unspecified: Secondary | ICD-10-CM

## 2015-11-28 DIAGNOSIS — J309 Allergic rhinitis, unspecified: Secondary | ICD-10-CM | POA: Diagnosis not present

## 2015-11-28 MED ORDER — UMECLIDINIUM BROMIDE 62.5 MCG/INH IN AEPB
1.0000 | INHALATION_SPRAY | Freq: Every day | RESPIRATORY_TRACT | Status: AC
Start: 2015-11-28 — End: 2015-11-29

## 2015-11-28 MED ORDER — FLUTICASONE FUROATE-VILANTEROL 100-25 MCG/INH IN AEPB: 1.0000 | INHALATION_SPRAY | Freq: Every day | RESPIRATORY_TRACT | Status: AC

## 2015-11-28 MED ORDER — CETIRIZINE HCL 10 MG PO TABS: 10.0000 mg | ORAL_TABLET | Freq: Every day | ORAL | Status: DC

## 2015-11-28 NOTE — Progress Notes (Signed)
[facility] Consultation     Date: 11/28/2015,   MRN# [id number] Luca Burston 1941/06/11 Code Status:  Hosp day:_0 @ Referring MD: _1 @     PCP:      AdmissionWeight: 266 lb 6.4 oz (120.838 kg)                 CurrentWeight: 266 lb 6.4 oz (120.838 kg) Darlene Robbins is a 75 y.o. old female seen in consultation for follow up SOB and acute bronchitis. 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 Alva Garnet 10/22/15 given doxy/prednisone Had UC visit and admission to [facility] for acute COPD exacerbation Had given therapy fro thrush Today-SOB much improved No signs of infection at this time   HHP HPI H   MEDICATIONS     Current Medication:   Current outpatient prescriptions:  .  albuterol (VENTOLIN HFA) 108 (90 BASE) MCG/ACT inhaler, Inhale 2 puffs into the lungs daily., Disp: 36 g, Rfl: 12 .  benzonatate (TESSALON) 200 MG capsule, Take 1 capsule (200 mg total) by mouth 2 (two) times daily as needed for cough., Disp: 30 capsule, Rfl: 0 .  Blood Glucose Calibration (ACCU-CHEK AVIVA) SOLN, 1 each by In Vitro route once., Disp: 1 each, Rfl: 2 .  Blood Glucose Monitoring Suppl (ONE TOUCH ULTRA SYSTEM KIT) W/DEVICE KIT, 1 kit by Does not apply route once. Use as directed. Test blood sugars 1-2 times daily., Disp: 1 each, Rfl: 0 .  cetirizine (ZYRTEC ALLERGY) 10 MG tablet, Take 1 tablet (10 mg total) by mouth daily., Disp: 90 tablet, Rfl: 12 .  diclofenac sodium (VOLTAREN) 1 % GEL, Apply 1 application topically 2 (two) times daily as needed.  , Disp: , Rfl:  .  fluconazole (DIFLUCAN) 100 MG tablet, Take 1 tablet (100 mg total) by mouth daily., Disp: 7 tablet, Rfl: 0 .  fluorouracil (EFUDEX) 5 % cream, APP AA BID FOR 4 WEEKS, Disp: , Rfl: 0 .  Fluticasone-Salmeterol (ADVAIR DISKUS) 250-50 MCG/DOSE AEPB, Inhale 1 puff into the lungs 2 (two) times daily.,  Disp: 180 each, Rfl: 11 .  furosemide (LASIX) 20 MG tablet, Take 1 tablet (20 mg total) by mouth daily as needed., Disp: 90 tablet, Rfl: 4 .  gentamicin ointment (GARAMYCIN) 0.1 %, Apply 1 application topically 3 (three) times daily., Disp: 30 g, Rfl: 3 .  glucose blood test strip, 1 each by Other route as needed for other. Use as instructed with One Touch Ultra Glucometer. Test blood sugars at home 1-2 times daily, Disp: 100 each, Rfl: 3 .  hydrocortisone 2.5 % cream, APPLY TO THE AFFECTED ON FACE BID PRN., Disp: , Rfl: 4 .  Lancets MISC, Use with One Touch Ultra Test blood sugars 1-2 times daily., Disp: 100 each, Rfl: 3 .  levothyroxine (SYNTHROID, LEVOTHROID) 75 MCG tablet, Take 1 tablet (75 mcg total) by mouth daily., Disp: 90 tablet, Rfl: 3 .  metFORMIN (GLUCOPHAGE) 500 MG tablet, Take 500 mg by mouth., Disp: , Rfl:  .  mirabegron ER (MYRBETRIQ) 50 MG TB24 tablet, Take 50 mg by mouth daily., Disp: , Rfl:  .  mirabegron ER (MYRBETRIQ) 50 MG TB24 tablet, Take 1 tablet (50 mg total) by mouth daily., Disp: 90 tablet, Rfl: 3 .  nystatin (MYCOSTATIN) 100000 UNIT/ML suspension, Take 5 mLs (500,000 Units total) by mouth 4 (four) times daily., Disp: 60 mL, Rfl: 0 .  nystatin (MYCOSTATIN/NYSTOP) 100000 UNIT/GM POWD,  Apply twice daily as needed, Disp: 60 g, Rfl: 3 .  pantoprazole (PROTONIX) 40 MG tablet, Take 1 tablet (40 mg total) by mouth 2 (two) times daily., Disp: 180 tablet, Rfl: 3     ALLERGIES   Cephalexin; Codeine; Penicillins; and Sulfa antibiotics     REVIEW OF SYSTEMS   Review of Systems  Constitutional: Negative for fever, chills, weight loss and malaise/fatigue.  HENT: Negative for congestion.   Respiratory: Positive for shortness of breath and wheezing.   Cardiovascular: Negative for leg swelling.  Gastrointestinal: Negative.   Neurological: Negative.   All other systems reviewed and are negative.    VS: BP 136/74 mmHg  Pulse 88  Ht 5' 7" (1.702 m)  Wt 266 lb 6.4 oz  (120.838 kg)  BMI 41.71 kg/m2  SpO2 98%     PHYSICAL EXAM   Physical Exam  Constitutional: She is oriented to person, place, and time. No distress.  HENT:  Head: Normocephalic and atraumatic.  Cardiovascular: Normal rate, regular rhythm and normal heart sounds.   No murmur heard. Pulmonary/Chest: No respiratory distress. She has no wheezes.  Musculoskeletal: Normal range of motion. She exhibits edema.  Neurological: She is alert and oriented to person, place, and time.  Skin: She is not diaphoretic.  Psychiatric: She has a normal mood and affect.        ASSESSMENT/PLAN   75 yo obese white female seen today for follow up for Moderate COPD Grade B with previous bout of  acute Bronchitis with acute COPD exacerbation   1.start Breo/start Incruse 2.albuterol as needed 3.continue zyrtec 4.check pulse oximetry 5.check PFT's  And compare to 11/2013 6.no n eed for oral steroids or ABX at this time  Follow up 1 month  The Patient requires high complexity decision making for assessment and support, frequent evaluation and titration of therapies, application of advanced monitoring technologies and extensive interpretation of multiple databases.   Patient satisfied with Plan of action and management. All questions answered   Corrin Parker, M.D.  [facility]  Medical Director [place] Director [facility] Cardio-Pulmonary Department

## 2015-11-28 NOTE — Patient Instructions (Signed)
Chronic Obstructive Pulmonary Disease Chronic obstructive pulmonary disease (COPD) is a common lung condition in which airflow from the lungs is limited. COPD is a general term that can be used to describe many different lung problems that limit airflow, including both chronic bronchitis and emphysema. If you have COPD, your lung function will probably never return to normal, but there are measures you can take to improve lung function and make yourself feel better. CAUSES   Smoking (common).  Exposure to secondhand smoke.  Genetic problems.  Chronic inflammatory lung diseases or recurrent infections. SYMPTOMS  Shortness of breath, especially with physical activity.  Deep, persistent (chronic) cough with a large amount of thick mucus.  Wheezing.  Rapid breaths (tachypnea).  Gray or bluish discoloration (cyanosis) of the skin, especially in your fingers, toes, or lips.  Fatigue.  Weight loss.  Frequent infections or episodes when breathing symptoms become much worse (exacerbations).  Chest tightness. DIAGNOSIS Your health care provider will take a medical history and perform a physical examination to diagnose COPD. Additional tests for COPD may include:  Lung (pulmonary) function tests.  Chest X-ray.  CT scan.  Blood tests. TREATMENT  Treatment for COPD may include:  Inhaler and nebulizer medicines. These help manage the symptoms of COPD and make your breathing more comfortable.  Supplemental oxygen. Supplemental oxygen is only helpful if you have a low oxygen level in your blood.  Exercise and physical activity. These are beneficial for nearly all people with COPD.  Lung surgery or transplant.  Nutrition therapy to gain weight, if you are underweight.  Pulmonary rehabilitation. This may involve working with a team of health care providers and specialists, such as respiratory, occupational, and physical therapists. HOME CARE INSTRUCTIONS  Take all medicines  (inhaled or pills) as directed by your health care provider.  Avoid over-the-counter medicines or cough syrups that dry up your airway (such as antihistamines) and slow down the elimination of secretions unless instructed otherwise by your health care provider.  If you are a smoker, the most important thing that you can do is stop smoking. Continuing to smoke will cause further lung damage and breathing trouble. Ask your health care provider for help with quitting smoking. He or she can direct you to community resources or hospitals that provide support.  Avoid exposure to irritants such as smoke, chemicals, and fumes that aggravate your breathing.  Use oxygen therapy and pulmonary rehabilitation if directed by your health care provider. If you require home oxygen therapy, ask your health care provider whether you should purchase a pulse oximeter to measure your oxygen level at home.  Avoid contact with individuals who have a contagious illness.  Avoid extreme temperature and humidity changes.  Eat healthy foods. Eating smaller, more frequent meals and resting before meals may help you maintain your strength.  Stay active, but balance activity with periods of rest. Exercise and physical activity will help you maintain your ability to do things you want to do.  Preventing infection and hospitalization is very important when you have COPD. Make sure to receive all the vaccines your health care provider recommends, especially the pneumococcal and influenza vaccines. Ask your health care provider whether you need a pneumonia vaccine.  Learn and use relaxation techniques to manage stress.  Learn and use controlled breathing techniques as directed by your health care provider. Controlled breathing techniques include:  Pursed lip breathing. Start by breathing in (inhaling) through your nose for 1 second. Then, purse your lips as if you were   going to whistle and breathe out (exhale) through the  pursed lips for 2 seconds.  Diaphragmatic breathing. Start by putting one hand on your abdomen just above your waist. Inhale slowly through your nose. The hand on your abdomen should move out. Then purse your lips and exhale slowly. You should be able to feel the hand on your abdomen moving in as you exhale.  Learn and use controlled coughing to clear mucus from your lungs. Controlled coughing is a series of short, progressive coughs. The steps of controlled coughing are: 1. Lean your head slightly forward. 2. Breathe in deeply using diaphragmatic breathing. 3. Try to hold your breath for 3 seconds. 4. Keep your mouth slightly open while coughing twice. 5. Spit any mucus out into a tissue. 6. Rest and repeat the steps once or twice as needed. SEEK MEDICAL CARE IF:  You are coughing up more mucus than usual.  There is a change in the color or thickness of your mucus.  Your breathing is more labored than usual.  Your breathing is faster than usual. SEEK IMMEDIATE MEDICAL CARE IF:  You have shortness of breath while you are resting.  You have shortness of breath that prevents you from:  Being able to talk.  Performing your usual physical activities.  You have chest pain lasting longer than 5 minutes.  Your skin color is more cyanotic than usual.  You measure low oxygen saturations for longer than 5 minutes with a pulse oximeter. MAKE SURE YOU:  Understand these instructions.  Will watch your condition.  Will get help right away if you are not doing well or get worse.   This information is not intended to replace advice given to you by your health care provider. Make sure you discuss any questions you have with your health care provider.   Document Released: 08/06/2005 Document Revised: 11/17/2014 Document Reviewed: 06/23/2013 Elsevier Interactive Patient Education 2016 [employer] Inc[employer]  

## 2015-11-30 ENCOUNTER — Telehealth: Payer: Self-pay | Admitting: *Deleted

## 2015-11-30 MED ORDER — FLUTICASONE FUROATE-VILANTEROL 100-25 MCG/INH IN AEPB: 1.0000 | INHALATION_SPRAY | Freq: Every day | RESPIRATORY_TRACT | Status: DC

## 2015-11-30 MED ORDER — UMECLIDINIUM BROMIDE 62.5 MCG/INH IN AEPB
1.0000 | INHALATION_SPRAY | Freq: Every day | RESPIRATORY_TRACT | Status: DC
Start: 2015-11-30 — End: 2015-12-27

## 2015-11-30 NOTE — Telephone Encounter (Signed)
Pt calling stating she needs [place] to send a 30 day prescripton sent over to [facility] in [place] for Breo and Inscruse

## 2015-11-30 NOTE — Telephone Encounter (Signed)
Pt informed medications sent to pharmacy.

## 2015-12-01 ENCOUNTER — Encounter: Payer: Self-pay | Admitting: Internal Medicine

## 2015-12-01 DIAGNOSIS — J449 Chronic obstructive pulmonary disease, unspecified: Secondary | ICD-10-CM | POA: Diagnosis not present

## 2015-12-03 ENCOUNTER — Encounter: Payer: Self-pay | Admitting: Internal Medicine

## 2015-12-03 ENCOUNTER — Ambulatory Visit (INDEPENDENT_AMBULATORY_CARE_PROVIDER_SITE_OTHER): Payer: Medicare Other | Admitting: Internal Medicine

## 2015-12-03 VITALS — BP 140/84 | HR 76 | Temp 97.9°F | Ht 67.0 in | Wt 266.5 lb

## 2015-12-03 DIAGNOSIS — R112 Nausea with vomiting, unspecified: Secondary | ICD-10-CM

## 2015-12-03 DIAGNOSIS — E118 Type 2 diabetes mellitus with unspecified complications: Secondary | ICD-10-CM

## 2015-12-03 DIAGNOSIS — J42 Unspecified chronic bronchitis: Secondary | ICD-10-CM

## 2015-12-03 MED ORDER — ONDANSETRON HCL 8 MG PO TABS: 8.0000 mg | ORAL_TABLET | Freq: Three times a day (TID) | ORAL | Status: AC | PRN

## 2015-12-03 MED ORDER — METFORMIN HCL 500 MG PO TABS: 500.0000 mg | ORAL_TABLET | Freq: Two times a day (BID) | ORAL | Status: DC

## 2015-12-03 NOTE — Progress Notes (Signed)
Subjective:    Patient ID: Darlene Robbins, female    DOB: [birth year], 75 y.o.   MRN: [id number]  HPI  75YO female presents for follow up.  Recently seen by Dr. Mortimer Fries. Changed to [facility]. Scheduled for PFTs. Also did overnight oximetry which is pending. Prior to this, was hospitalized at [facility] for bronchitis.  Feeling better. Energy improved. Breathing better.  DM - Started on Metformin. BG have been in 160s fasting. 190 after meals. All under 200. Limiting sugar in diet. Unable to exercise because of joint pain.  Having occasional nausea. This has been ongoing for years. Typically every 2-3 weeks. Takes occasional Ondansetron with improvement.    Wt Readings from Last 3 Encounters:  12/03/15 266 lb 8 oz (120.884 kg)  11/28/15 266 lb 6.4 oz (120.838 kg)  11/23/15 264 lb 14.4 oz (120.158 kg)   BP Readings from Last 3 Encounters:  12/03/15 140/84  11/28/15 136/74  11/23/15 174/76    Past Medical History  Diagnosis Date  . Thyroid disease   . Kidney stones     laser surgery in the past  . COPD (chronic obstructive pulmonary disease) ([place])     followed by Dr.Fleming  . Arthritis     uses celebrex  . Sleep apnea     CPAP 7?  . Abdominal pain     Resolved,MRSA and yeast likely colonization in setting of multiple antibiotics.  . Vitamin D deficiency 07-11-2010    drisdol 50,000 units 1 weekly x 12 weeks no refill active  . Hypertension 07/30/2012  . Varicose veins 09/05/2015  . Benign essential HTN 03/02/2015  . Heart valve disease 10/17/2015  . Leg varices 04/25/2014   Family History  Problem Relation Age of Onset  . Peripheral vascular disease Mother   . Heart disease Mother   . COPD Mother   . Dementia Father   . ALS Brother   . Heart disease Brother     s/p CABG   Past Surgical History  Procedure Laterality Date  . Straighten nasal septum      Dr.McQueen  . Laser surgery for kidney stones      followed by Dr.Humphries  . Vein surgery      Dr.Schnier  .  Cataract extraction Bilateral     Dr. Rosana Berger  . Tonsillectomy     Social History   Social History  . Marital Status: Widowed    Spouse Name: N/A  . Number of Children: N/A  . Years of Education: N/A   Social History Main Topics  . Smoking status: Never Smoker   . Smokeless tobacco: Never Used  . Alcohol Use: No  . Drug Use: No  . Sexual Activity: Not Asked   Other Topics Concern  . None   Social History Narrative   Lives with son.    Review of Systems  Constitutional: Negative for fever, chills, appetite change, fatigue and unexpected weight change.  Eyes: Negative for visual disturbance.  Respiratory: Negative for cough, shortness of breath and wheezing.   Cardiovascular: Negative for chest pain, palpitations and leg swelling.  Gastrointestinal: Negative for nausea, vomiting, abdominal pain, diarrhea and constipation.  Musculoskeletal: Positive for myalgias and arthralgias.  Skin: Negative for color change and rash.  Hematological: Negative for adenopathy. Does not bruise/bleed easily.  Psychiatric/Behavioral: Negative for dysphoric mood. The patient is not nervous/anxious.        Objective:    BP 140/84 mmHg  Pulse 76  Temp(Src) 97.9 F (36.6 C) (Oral)  Ht 5\' 7"  (1.702 m)  Wt 266 lb 8 oz (120.884 kg)  BMI 41.73 kg/m2  SpO2 95% Physical Exam  Constitutional: She is oriented to person, place, and time. She appears well-developed and well-nourished. No distress.  HENT:  Head: Normocephalic and atraumatic.  Right Ear: External ear normal.  Left Ear: External ear normal.  Nose: Nose normal.  Mouth/Throat: Oropharynx is clear and moist. No oropharyngeal exudate.  Eyes: Conjunctivae are normal. Pupils are equal, round, and reactive to light. Right eye exhibits no discharge. Left eye exhibits no discharge. No scleral icterus.  Neck: Normal range of motion. Neck supple. No tracheal deviation present. No thyromegaly present.  Cardiovascular: Normal rate, regular  rhythm, normal heart sounds and intact distal pulses.  Exam reveals no gallop and no friction rub.   No murmur heard. Pulmonary/Chest: Effort normal and breath sounds normal. No respiratory distress. She has no wheezes. She has no rales. She exhibits no tenderness.  Musculoskeletal: Normal range of motion. She exhibits no edema or tenderness.  Lymphadenopathy:    She has no cervical adenopathy.  Neurological: She is alert and oriented to person, place, and time. No cranial nerve deficit. She exhibits normal muscle tone. Coordination normal.  Skin: Skin is warm and dry. No rash noted. She is not diaphoretic. No erythema. No pallor.  Psychiatric: She has a normal mood and affect. Her behavior is normal. Judgment and thought content normal.          Assessment & Plan:   Problem List Items Addressed This Visit      High   COPD (chronic obstructive pulmonary disease) ([place])    Reviewed notes from Dr. Mortimer Fries. PFTs pending. Recently changed to [facility]. Symptoms of dyspnea have improved. Will continue to monitor.      Diabetes type 2, controlled ([place]) - Primary    BG improved by report. Will continue Metformin. Repeat A1c in 12/2015.      Relevant Medications   metFORMIN (GLUCOPHAGE) 500 MG tablet   Other Relevant Orders   Comprehensive metabolic panel   Hemoglobin A1c     Unprioritized   Nausea with vomiting    Chronic intermittent nausea, ongoing for years. Will continue prn ondansetron.          No Follow-up on file.

## 2015-12-03 NOTE — Assessment & Plan Note (Signed)
BG improved by report. Will continue Metformin. Repeat A1c in 12/2015.

## 2015-12-03 NOTE — Patient Instructions (Signed)
Start Ondansetron 8mg  by mouth as needed for nausea.  Follow up 2/14

## 2015-12-03 NOTE — Assessment & Plan Note (Signed)
Reviewed notes from Dr. Mortimer Fries. PFTs pending. Recently changed to [facility]. Symptoms of dyspnea have improved. Will continue to monitor.

## 2015-12-03 NOTE — Assessment & Plan Note (Signed)
Chronic intermittent nausea, ongoing for years. Will continue prn ondansetron.

## 2015-12-21 ENCOUNTER — Ambulatory Visit (INDEPENDENT_AMBULATORY_CARE_PROVIDER_SITE_OTHER): Payer: Medicare Other | Admitting: *Deleted

## 2015-12-21 DIAGNOSIS — J449 Chronic obstructive pulmonary disease, unspecified: Secondary | ICD-10-CM

## 2015-12-21 DIAGNOSIS — J42 Unspecified chronic bronchitis: Secondary | ICD-10-CM

## 2015-12-21 LAB — PULMONARY FUNCTION TEST
DL/VA % pred: 96 %
DL/VA: 4.96 ml/min/mmHg/L
DLCO UNC % PRED: 71 %
DLCO unc: 20.25 ml/min/mmHg
FEF 25-75 Post: 2.14 L/sec
FEF 25-75 Pre: 1.74 L/sec
FEF2575-%Change-Post: 22 %
FEF2575-%Pred-Post: 113 %
FEF2575-%Pred-Pre: 92 %
FEV1-%CHANGE-POST: 8 %
FEV1-%PRED-POST: 85 %
FEV1-%Pred-Pre: 78 %
FEV1-Post: 2.07 L
FEV1-Pre: 1.91 L
FEV1FVC-%Change-Post: 4 %
FEV1FVC-%Pred-Pre: 105 %
FEV6-%Change-Post: 3 %
FEV6-%PRED-POST: 81 %
FEV6-%PRED-PRE: 78 %
FEV6-POST: 2.51 L
FEV6-PRE: 2.42 L
FEV6FVC-%PRED-POST: 105 %
FEV6FVC-%PRED-PRE: 105 %
FVC-%CHANGE-POST: 3 %
FVC-%PRED-POST: 77 %
FVC-%PRED-PRE: 75 %
FVC-POST: 2.51 L
FVC-Pre: 2.42 L
PRE FEV6/FVC RATIO: 100 %
Post FEV1/FVC ratio: 83 %
Post FEV6/FVC ratio: 100 %
Pre FEV1/FVC ratio: 79 %

## 2015-12-21 NOTE — Progress Notes (Signed)
SMW performed today. 

## 2015-12-21 NOTE — Progress Notes (Signed)
PFT performed today with nitrogen washout. 

## 2015-12-24 ENCOUNTER — Telehealth: Payer: Self-pay | Admitting: *Deleted

## 2015-12-24 ENCOUNTER — Other Ambulatory Visit: Payer: Medicare Other

## 2015-12-24 NOTE — Telephone Encounter (Signed)
Pt informed she doesn't need O2 per her ONO. Nothing further needed.

## 2015-12-25 ENCOUNTER — Ambulatory Visit: Payer: Medicare Other | Admitting: Internal Medicine

## 2015-12-27 ENCOUNTER — Ambulatory Visit (INDEPENDENT_AMBULATORY_CARE_PROVIDER_SITE_OTHER): Payer: Medicare Other | Admitting: Internal Medicine

## 2015-12-27 ENCOUNTER — Encounter: Payer: Self-pay | Admitting: Internal Medicine

## 2015-12-27 VITALS — BP 132/70 | HR 85 | Ht 67.0 in | Wt 266.4 lb

## 2015-12-27 DIAGNOSIS — J449 Chronic obstructive pulmonary disease, unspecified: Secondary | ICD-10-CM | POA: Diagnosis not present

## 2015-12-27 MED ORDER — ADVAIR DISKUS 250-50 MCG/DOSE IN AEPB: 1.0000 | INHALATION_SPRAY | Freq: Two times a day (BID) | RESPIRATORY_TRACT | Status: DC

## 2015-12-27 NOTE — Progress Notes (Signed)
[facility] Consultation     Date: 12/27/2015,   MRN# [id number] Darlene Robbins [birth year] Code Status:  Hosp day:'@LENGTHOFSTAYDAYS'$ @ Referring MD: '@ATDPROV'$ @     PCP:      AdmissionWeight: 266 lb 6.4 oz (120.838 kg)                 CurrentWeight: 266 lb 6.4 oz (120.838 kg) Darlene Robbins is a 75 y.o. old female seen in consultation for follow up SOB and acute bronchitis. PFT's1/2015 Ratio 66%, FEV1 70% PFT 12/2015 Ratio 79% FEv1 78%  SYNOPSIS: moderate COPD from second hand smoke  CC: follow up SOB,wheezing HPI Doing well with inhalers, started back with Advair, stopped breo  Today-SOB much improved No signs of infection at this time   HHP HPI H   MEDICATIONS     Current Medication:   Current outpatient prescriptions:  .  albuterol (VENTOLIN HFA) 108 (90 BASE) MCG/ACT inhaler, Inhale 2 puffs into the lungs daily. (Patient taking differently: Inhale 2 puffs into the lungs every 4 (four) hours as needed. ), Disp: 36 g, Rfl: 12 .  benzonatate (TESSALON) 200 MG capsule, Take 1 capsule (200 mg total) by mouth 2 (two) times daily as needed for cough., Disp: 30 capsule, Rfl: 0 .  Blood Glucose Calibration (ACCU-CHEK AVIVA) SOLN, 1 each by In Vitro route once., Disp: 1 each, Rfl: 2 .  Blood Glucose Monitoring Suppl (ONE TOUCH ULTRA SYSTEM KIT) W/DEVICE KIT, 1 kit by Does not apply route once. Use as directed. Test blood sugars 1-2 times daily., Disp: 1 each, Rfl: 0 .  cetirizine (ZYRTEC ALLERGY) 10 MG tablet, Take 1 tablet (10 mg total) by mouth daily., Disp: 90 tablet, Rfl: 12 .  diclofenac sodium (VOLTAREN) 1 % GEL, Apply 1 application topically 2 (two) times daily as needed.  , Disp: , Rfl:  .  fluorouracil (EFUDEX) 5 % cream, APP AA BID FOR 4 WEEKS, Disp: , Rfl: 0 .  Fluticasone Furoate-Vilanterol 100-25 MCG/INH AEPB, Inhale 1 puff into the lungs daily., Disp: 60 each, Rfl: 5 .  furosemide (LASIX) 20 MG tablet, Take 1 tablet (20 mg total) by  mouth daily as needed., Disp: 90 tablet, Rfl: 4 .  gentamicin ointment (GARAMYCIN) 0.1 %, Apply 1 application topically 3 (three) times daily. (Patient taking differently: Apply 1 application topically 3 (three) times daily as needed. ), Disp: 30 g, Rfl: 3 .  glucose blood test strip, 1 each by Other route as needed for other. Use as instructed with One Touch Ultra Glucometer. Test blood sugars at home 1-2 times daily, Disp: 100 each, Rfl: 3 .  hydrocortisone 2.5 % cream, APPLY TO THE AFFECTED ON FACE BID PRN., Disp: , Rfl: 4 .  Lancets MISC, Use with One Touch Ultra Test blood sugars 1-2 times daily., Disp: 100 each, Rfl: 3 .  levothyroxine (SYNTHROID, LEVOTHROID) 75 MCG tablet, Take 1 tablet (75 mcg total) by mouth daily., Disp: 90 tablet, Rfl: 3 .  metFORMIN (GLUCOPHAGE) 500 MG tablet, Take 1 tablet (500 mg total) by mouth 2 (two) times daily with a meal., Disp: 180 tablet, Rfl: 3 .  mirabegron ER (MYRBETRIQ) 50 MG TB24 tablet, Take 1 tablet (50 mg total) by mouth daily., Disp: 90 tablet, Rfl: 3 .  nystatin (MYCOSTATIN) 100000 UNIT/ML suspension, , Disp: , Rfl:  .  nystatin (MYCOSTATIN/NYSTOP) 100000 UNIT/GM POWD, Apply twice daily as needed, Disp: 60 g, Rfl: 3 .  ondansetron (ZOFRAN) 8 MG tablet, Take 1 tablet (8 mg  total) by mouth every 8 (eight) hours as needed for nausea or vomiting., Disp: 30 tablet, Rfl: 3 .  Umeclidinium Bromide (INCRUSE ELLIPTA) 62.5 MCG/INH AEPB, Inhale 1 puff into the lungs daily., Disp: 30 each, Rfl: 5 .  pantoprazole (PROTONIX) 40 MG tablet, Take 1 tablet (40 mg total) by mouth 2 (two) times daily., Disp: 180 tablet, Rfl: 3     ALLERGIES   Cephalexin; Codeine; Penicillins; and Sulfa antibiotics     REVIEW OF SYSTEMS   Review of Systems  Constitutional: Negative for fever, chills, weight loss and malaise/fatigue.  HENT: Negative for congestion.   Respiratory: Negative for shortness of breath and wheezing.   Cardiovascular: Negative for leg swelling.    Gastrointestinal: Negative.   Neurological: Negative.   All other systems reviewed and are negative.    VS: BP 132/70 mmHg  Pulse 85  Ht '5\' 7"'$  (1.702 m)  Wt 266 lb 6.4 oz (120.838 kg)  BMI 41.71 kg/m2  SpO2 97%     PHYSICAL EXAM   Physical Exam  Constitutional: She is oriented to person, place, and time. No distress.  HENT:  Head: Normocephalic and atraumatic.  Cardiovascular: Normal rate, regular rhythm and normal heart sounds.   No murmur heard. Pulmonary/Chest: No respiratory distress. She has no wheezes.  Musculoskeletal: Normal range of motion. She exhibits edema.  Neurological: She is alert and oriented to person, place, and time.  Skin: She is not diaphoretic.  Psychiatric: She has a normal mood and affect.        ASSESSMENT/PLAN   75 yo obese white female seen today for follow up for Moderate COPD Grade B    1.stop Breo and Incruse, RESTART ADVAIR 2.albuterol as needed 3.continue zyrtec 4.no n eed for oral steroids or ABX at this time  Follow up 3 month  The Patient requires high complexity decision making for assessment and support, frequent evaluation and titration of therapies, application of advanced monitoring technologies and extensive interpretation of multiple databases.   Patient satisfied with Plan of action and management. All questions answered   Corrin Parker, M.D.  [facility]  Medical Director [place] Director [facility] Cardio-Pulmonary Department

## 2015-12-27 NOTE — Patient Instructions (Signed)
Chronic Obstructive Pulmonary Disease Chronic obstructive pulmonary disease (COPD) is a common lung condition in which airflow from the lungs is limited. COPD is a general term that can be used to describe many different lung problems that limit airflow, including both chronic bronchitis and emphysema. If you have COPD, your lung function will probably never return to normal, but there are measures you can take to improve lung function and make yourself feel better. CAUSES   Smoking (common).  Exposure to secondhand smoke.  Genetic problems.  Chronic inflammatory lung diseases or recurrent infections. SYMPTOMS  Shortness of breath, especially with physical activity.  Deep, persistent (chronic) cough with a large amount of thick mucus.  Wheezing.  Rapid breaths (tachypnea).  Gray or bluish discoloration (cyanosis) of the skin, especially in your fingers, toes, or lips.  Fatigue.  Weight loss.  Frequent infections or episodes when breathing symptoms become much worse (exacerbations).  Chest tightness. DIAGNOSIS Your health care provider will take a medical history and perform a physical examination to diagnose COPD. Additional tests for COPD may include:  Lung (pulmonary) function tests.  Chest X-ray.  CT scan.  Blood tests. TREATMENT  Treatment for COPD may include:  Inhaler and nebulizer medicines. These help manage the symptoms of COPD and make your breathing more comfortable.  Supplemental oxygen. Supplemental oxygen is only helpful if you have a low oxygen level in your blood.  Exercise and physical activity. These are beneficial for nearly all people with COPD.  Lung surgery or transplant.  Nutrition therapy to gain weight, if you are underweight.  Pulmonary rehabilitation. This may involve working with a team of health care providers and specialists, such as respiratory, occupational, and physical therapists. HOME CARE INSTRUCTIONS  Take all medicines  (inhaled or pills) as directed by your health care provider.  Avoid over-the-counter medicines or cough syrups that dry up your airway (such as antihistamines) and slow down the elimination of secretions unless instructed otherwise by your health care provider.  If you are a smoker, the most important thing that you can do is stop smoking. Continuing to smoke will cause further lung damage and breathing trouble. Ask your health care provider for help with quitting smoking. He or she can direct you to community resources or hospitals that provide support.  Avoid exposure to irritants such as smoke, chemicals, and fumes that aggravate your breathing.  Use oxygen therapy and pulmonary rehabilitation if directed by your health care provider. If you require home oxygen therapy, ask your health care provider whether you should purchase a pulse oximeter to measure your oxygen level at home.  Avoid contact with individuals who have a contagious illness.  Avoid extreme temperature and humidity changes.  Eat healthy foods. Eating smaller, more frequent meals and resting before meals may help you maintain your strength.  Stay active, but balance activity with periods of rest. Exercise and physical activity will help you maintain your ability to do things you want to do.  Preventing infection and hospitalization is very important when you have COPD. Make sure to receive all the vaccines your health care provider recommends, especially the pneumococcal and influenza vaccines. Ask your health care provider whether you need a pneumonia vaccine.  Learn and use relaxation techniques to manage stress.  Learn and use controlled breathing techniques as directed by your health care provider. Controlled breathing techniques include:  Pursed lip breathing. Start by breathing in (inhaling) through your nose for 1 second. Then, purse your lips as if you were   going to whistle and breathe out (exhale) through the  pursed lips for 2 seconds.  Diaphragmatic breathing. Start by putting one hand on your abdomen just above your waist. Inhale slowly through your nose. The hand on your abdomen should move out. Then purse your lips and exhale slowly. You should be able to feel the hand on your abdomen moving in as you exhale.  Learn and use controlled coughing to clear mucus from your lungs. Controlled coughing is a series of short, progressive coughs. The steps of controlled coughing are: 1. Lean your head slightly forward. 2. Breathe in deeply using diaphragmatic breathing. 3. Try to hold your breath for 3 seconds. 4. Keep your mouth slightly open while coughing twice. 5. Spit any mucus out into a tissue. 6. Rest and repeat the steps once or twice as needed. SEEK MEDICAL CARE IF:  You are coughing up more mucus than usual.  There is a change in the color or thickness of your mucus.  Your breathing is more labored than usual.  Your breathing is faster than usual. SEEK IMMEDIATE MEDICAL CARE IF:  You have shortness of breath while you are resting.  You have shortness of breath that prevents you from:  Being able to talk.  Performing your usual physical activities.  You have chest pain lasting longer than 5 minutes.  Your skin color is more cyanotic than usual.  You measure low oxygen saturations for longer than 5 minutes with a pulse oximeter. MAKE SURE YOU:  Understand these instructions.  Will watch your condition.  Will get help right away if you are not doing well or get worse.   This information is not intended to replace advice given to you by your health care provider. Make sure you discuss any questions you have with your health care provider.   Document Released: 08/06/2005 Document Revised: 11/17/2014 Document Reviewed: 06/23/2013 Elsevier Interactive Patient Education 2016 [employer] Inc[employer]  

## 2016-01-04 ENCOUNTER — Other Ambulatory Visit: Payer: Self-pay | Admitting: Internal Medicine

## 2016-01-22 ENCOUNTER — Other Ambulatory Visit (INDEPENDENT_AMBULATORY_CARE_PROVIDER_SITE_OTHER): Payer: Medicare Other

## 2016-01-22 DIAGNOSIS — E118 Type 2 diabetes mellitus with unspecified complications: Secondary | ICD-10-CM | POA: Diagnosis not present

## 2016-01-22 LAB — COMPREHENSIVE METABOLIC PANEL
ALT: 20 U/L (ref 0–35)
AST: 25 U/L (ref 0–37)
Albumin: 4.3 g/dL (ref 3.5–5.2)
Alkaline Phosphatase: 88 U/L (ref 39–117)
BUN: 15 mg/dL (ref 6–23)
CHLORIDE: 103 meq/L (ref 96–112)
CO2: 27 mEq/L (ref 19–32)
Calcium: 9.8 mg/dL (ref 8.4–10.5)
Creatinine, Ser: 0.86 mg/dL (ref 0.40–1.20)
GFR: 68.51 mL/min (ref >60.00)
GLUCOSE: 167 mg/dL — AB (ref 70–99)
POTASSIUM: 4.3 meq/L (ref 3.5–5.1)
SODIUM: 140 meq/L (ref 135–145)
Total Bilirubin: 0.7 mg/dL (ref 0.2–1.2)
Total Protein: 6.8 g/dL (ref 6.0–8.3)

## 2016-01-22 LAB — HEMOGLOBIN A1C: Hgb A1c MFr Bld: 7.5 % — ABNORMAL HIGH (ref 4.6–6.5)

## 2016-01-24 ENCOUNTER — Ambulatory Visit: Payer: Medicare Other | Admitting: Internal Medicine

## 2016-01-30 ENCOUNTER — Telehealth: Payer: Self-pay | Admitting: Internal Medicine

## 2016-01-30 ENCOUNTER — Telehealth: Payer: Self-pay

## 2016-01-30 ENCOUNTER — Ambulatory Visit (INDEPENDENT_AMBULATORY_CARE_PROVIDER_SITE_OTHER): Payer: Medicare Other | Admitting: Internal Medicine

## 2016-01-30 ENCOUNTER — Encounter: Payer: Self-pay | Admitting: Internal Medicine

## 2016-01-30 VITALS — BP 123/69 | HR 89 | Temp 98.1°F | Ht 67.0 in | Wt 266.5 lb

## 2016-01-30 DIAGNOSIS — M1990 Unspecified osteoarthritis, unspecified site: Secondary | ICD-10-CM

## 2016-01-30 DIAGNOSIS — E118 Type 2 diabetes mellitus with unspecified complications: Secondary | ICD-10-CM | POA: Diagnosis not present

## 2016-01-30 DIAGNOSIS — J42 Unspecified chronic bronchitis: Secondary | ICD-10-CM

## 2016-01-30 MED ORDER — LANCETS MISC: Status: DC

## 2016-01-30 MED ORDER — ACCU-CHEK SOFT TOUCH LANCETS MISC: Status: DC

## 2016-01-30 MED ORDER — ACCU-CHEK FASTCLIX LANCETS MISC: 1.0000 | Freq: Two times a day (BID) | Status: AC

## 2016-01-30 NOTE — Assessment & Plan Note (Signed)
BG improved on recent check. Will continue current medications.

## 2016-01-30 NOTE — Patient Instructions (Signed)
Continue current medication.  Follow up in 3 months or sooner as needed.

## 2016-01-30 NOTE — Telephone Encounter (Signed)
Pt called back stating it's the fast click lancets. Thank you!

## 2016-01-30 NOTE — Assessment & Plan Note (Addendum)
Symptomatically, doing well on Advair. Reviewed pulmonary notes. Continue Advair.

## 2016-01-30 NOTE — Telephone Encounter (Signed)
Pt called about her lancets. [facility] and the lancets are not there. Pt wants lancets to go to [place] [id number] - [place], [place]. Call pt @ [phone]. Thank you!

## 2016-01-30 NOTE — Assessment & Plan Note (Signed)
Chronic OA with pain mostly in right ankle. Continue prn Ibuprofen. Discussed potential risks and benefits of this medication. Discussed referral to ortho if symptoms are not well controlled with ibuprofen.

## 2016-01-30 NOTE — Telephone Encounter (Signed)
Medication sent correctly

## 2016-01-30 NOTE — Telephone Encounter (Signed)
Pt's script needs a ICD10 code on actual script.

## 2016-01-30 NOTE — Progress Notes (Signed)
Subjective:    Patient ID: Darlene Robbins, female    DOB: 09-Jul-1941, 75 y.o.   MRN: [id number]  HPI  75YO female presents for follow up.  DM - BG 90s in morning. Max 160. Compliant with medication.  COPD - Occasional wheeze, however not persistent. No nasal congestion. Recent PFTs improved with Dr. Mortimer Fries. Changed back to Advair because of cost.  Right ankle pain - Stopped Meloxicam because of kidney dysfunction. Had been using Ibuprofen. Pain improves during morning.  Wt Readings from Last 3 Encounters:  01/30/16 266 lb 8 oz (120.884 kg)  12/27/15 266 lb 6.4 oz (120.838 kg)  12/03/15 266 lb 8 oz (120.884 kg)   BP Readings from Last 3 Encounters:  01/30/16 123/69  12/27/15 132/70  12/03/15 140/84    Past Medical History  Diagnosis Date  . Thyroid disease   . Kidney stones     laser surgery in the past  . COPD (chronic obstructive pulmonary disease) ([place])     followed by Dr.Fleming  . Arthritis     uses celebrex  . Sleep apnea     CPAP 7?  . Abdominal pain     Resolved,MRSA and yeast likely colonization in setting of multiple antibiotics.  . Vitamin D deficiency 07-11-2010    drisdol 50,000 units 1 weekly x 12 weeks no refill active  . Hypertension 07/30/2012  . Varicose veins 09/05/2015  . Benign essential HTN 03/02/2015  . Heart valve disease 10/17/2015  . Leg varices 04/25/2014   Family History  Problem Relation Age of Onset  . Peripheral vascular disease Mother   . Heart disease Mother   . COPD Mother   . Dementia Father   . ALS Brother   . Heart disease Brother     s/p CABG   Past Surgical History  Procedure Laterality Date  . Straighten nasal septum      Dr.McQueen  . Laser surgery for kidney stones      followed by Dr.Humphries  . Vein surgery      Dr.Schnier  . Cataract extraction Bilateral     Dr. Rosana Berger  . Tonsillectomy     Social History   Social History  . Marital Status: Widowed    Spouse Name: N/A  . Number of Children: N/A    . Years of Education: N/A   Social History Main Topics  . Smoking status: Never Smoker   . Smokeless tobacco: Never Used  . Alcohol Use: No  . Drug Use: No  . Sexual Activity: Not Asked   Other Topics Concern  . None   Social History Narrative   Lives with son.    Review of Systems  Constitutional: Negative for fever, chills, appetite change, fatigue and unexpected weight change.  HENT: Negative for congestion, postnasal drip, rhinorrhea, sinus pressure, sore throat and voice change.   Eyes: Negative for visual disturbance.  Respiratory: Positive for cough. Negative for chest tightness, shortness of breath and wheezing.   Cardiovascular: Negative for chest pain and leg swelling.  Gastrointestinal: Negative for nausea, vomiting, abdominal pain, diarrhea and constipation.  Musculoskeletal: Positive for myalgias and arthralgias.  Skin: Negative for color change and rash.  Neurological: Negative for weakness.  Hematological: Negative for adenopathy. Does not bruise/bleed easily.  Psychiatric/Behavioral: Negative for sleep disturbance and dysphoric mood. The patient is not nervous/anxious.        Objective:    BP 123/69 mmHg  Pulse 89  Temp(Src) 98.1 F (36.7 C) (Oral)  Ht  5\' 7"  (1.702 m)  Wt 266 lb 8 oz (120.884 kg)  BMI 41.73 kg/m2  SpO2 97% Physical Exam  Constitutional: She is oriented to person, place, and time. She appears well-developed and well-nourished. No distress.  HENT:  Head: Normocephalic and atraumatic.  Right Ear: External ear normal.  Left Ear: External ear normal.  Nose: Nose normal.  Mouth/Throat: Oropharynx is clear and moist. No oropharyngeal exudate.  Eyes: Conjunctivae are normal. Pupils are equal, round, and reactive to light. Right eye exhibits no discharge. Left eye exhibits no discharge. No scleral icterus.  Neck: Normal range of motion. Neck supple. No tracheal deviation present. No thyromegaly present.  Cardiovascular: Normal rate, regular  rhythm, normal heart sounds and intact distal pulses.  Exam reveals no gallop and no friction rub.   No murmur heard. Pulmonary/Chest: Effort normal and breath sounds normal. No respiratory distress. She has no wheezes. She has no rales. She exhibits no tenderness.  Musculoskeletal: Normal range of motion. She exhibits no edema or tenderness.       Right ankle: She exhibits normal range of motion and no swelling. No tenderness.  Lymphadenopathy:    She has no cervical adenopathy.  Neurological: She is alert and oriented to person, place, and time. No cranial nerve deficit. She exhibits normal muscle tone. Coordination normal.  Skin: Skin is warm and dry. No rash noted. She is not diaphoretic. No erythema. No pallor.  Psychiatric: She has a normal mood and affect. Her behavior is normal. Judgment and thought content normal.          Assessment & Plan:   Problem List Items Addressed This Visit      High   COPD (chronic obstructive pulmonary disease) ([place])    Symptomatically, doing well on Advair. Reviewed pulmonary notes. Continue Advair.      Diabetes type 2, controlled ([place]) - Primary    BG improved on recent check. Will continue current medications.        Unprioritized   Arthritis    Chronic OA with pain mostly in right ankle. Continue prn Ibuprofen. Discussed potential risks and benefits of this medication. Discussed referral to ortho if symptoms are not well controlled with ibuprofen.          Return in about 3 months (around 05/01/2016) for Recheck of Diabetes.  Ronette Deter, MD Internal Medicine [facility]

## 2016-01-30 NOTE — Progress Notes (Signed)
Pre visit review using our clinic review tool, if applicable. No additional management support is needed unless otherwise documented below in the visit note. 

## 2016-02-13 ENCOUNTER — Ambulatory Visit: Payer: Medicare Other

## 2016-03-03 ENCOUNTER — Ambulatory Visit (INDEPENDENT_AMBULATORY_CARE_PROVIDER_SITE_OTHER): Payer: Medicare Other | Admitting: Urology

## 2016-03-03 ENCOUNTER — Encounter: Payer: Self-pay | Admitting: Urology

## 2016-03-03 VITALS — BP 168/82 | HR 73 | Ht 67.0 in | Wt 269.7 lb

## 2016-03-03 DIAGNOSIS — N3946 Mixed incontinence: Secondary | ICD-10-CM

## 2016-03-03 DIAGNOSIS — N3941 Urge incontinence: Secondary | ICD-10-CM

## 2016-03-03 MED ORDER — MIRABEGRON ER 50 MG PO TB24: 50.0000 mg | ORAL_TABLET | Freq: Every day | ORAL | Status: DC

## 2016-03-03 NOTE — Progress Notes (Signed)
03/03/2016 10:27 AM   Darlene Robbins [birth year] [id number]  Referring provider: Shelia Media, MD [address] [place], [place] [address]  No chief complaint on file.   HPI: The patient is a 75 year old woman who has mixed stress urge incontinence. I believe the urge component is more significant. She sometimes the cough is easy without any lifting. 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 She'll were 3-6 pads a day moderately wet or sometimes soaking   When the patient was here last time she had failed oxybutynin. She was doing beautifully on the beta 3 agonist.     PMH: Past Medical History  Diagnosis Date  . Thyroid disease   . Kidney stones     laser surgery in the past  . COPD (chronic obstructive pulmonary disease) (HCC)     followed by Dr.Fleming  . Arthritis     uses celebrex  . Sleep apnea     CPAP 7?  . Abdominal pain     Resolved,MRSA and yeast likely colonization in setting of multiple antibiotics.  . Vitamin D deficiency 07-11-2010    drisdol 50,000 units 1 weekly x 12 weeks no refill active  . Hypertension 07/30/2012  . Varicose veins 09/05/2015  . Benign essential HTN 03/02/2015  . Heart valve disease 10/17/2015  . Leg varices 04/25/2014    Surgical History: Past Surgical History  Procedure Laterality Date  . Straighten nasal septum      Dr.McQueen  . Laser surgery for kidney stones      followed by Dr.Humphries  . Vein surgery      Dr.Schnier  . Cataract extraction Bilateral     Dr. Regino Bellow  . Tonsillectomy      Home Medications:    Medication List       This list is accurate as of: 03/03/16 10:27 AM.  Always use your most recent med list.               ACCU-CHEK AVIVA Soln  1 each by In Vitro route once.     ACCU-CHEK FASTCLIX LANCETS Misc  1 kit by Does not apply route 2 (two) times daily.     ADVAIR DISKUS 250-50 MCG/DOSE Aepb  Generic drug:  Fluticasone-Salmeterol  Inhale 1 puff  into the lungs 2 (two) times daily.     albuterol 108 (90 Base) MCG/ACT inhaler  Commonly known as:  VENTOLIN HFA  Inhale 2 puffs into the lungs daily.     cetirizine 10 MG tablet  Commonly known as:  ZYRTEC ALLERGY  Take 1 tablet (10 mg total) by mouth daily.     diclofenac sodium 1 % Gel  Commonly known as:  VOLTAREN  Apply 1 application topically 2 (two) times daily as needed.     fluorouracil 5 % cream  Commonly known as:  EFUDEX  APP AA BID FOR 4 WEEKS     furosemide 20 MG tablet  Commonly known as:  LASIX  Take 1 tablet (20 mg total) by mouth daily as needed.     gentamicin ointment 0.1 %  Commonly known as:  GARAMYCIN  Apply 1 application topically 3 (three) times daily.     glucose blood test strip  1 each by Other route as needed for other. Use as instructed with One Touch Ultra Glucometer. Test blood sugars at home 1-2 times daily     hydrocortisone 2.5 % cream  APPLY TO THE AFFECTED ON FACE BID PRN.  levothyroxine 75 MCG tablet  Commonly known as:  SYNTHROID, LEVOTHROID  Take 1 tablet (75 mcg total) by mouth daily.     metFORMIN 500 MG tablet  Commonly known as:  GLUCOPHAGE  Take 1 tablet (500 mg total) by mouth 2 (two) times daily with a meal.     mirabegron ER 50 MG Tb24 tablet  Commonly known as:  MYRBETRIQ  Take 1 tablet (50 mg total) by mouth daily.     nystatin 100000 UNIT/GM Powd  Apply twice daily as needed     nystatin 100000 UNIT/ML suspension  Commonly known as:  MYCOSTATIN     ondansetron 8 MG tablet  Commonly known as:  ZOFRAN  Take 1 tablet (8 mg total) by mouth every 8 (eight) hours as needed for nausea or vomiting.     ONE TOUCH ULTRA SYSTEM KIT w/Device Kit  1 kit by Does not apply route once. Use as directed. Test blood sugars 1-2 times daily.     pantoprazole 40 MG tablet  Commonly known as:  PROTONIX  Take 1 tablet (40 mg total) by mouth 2 (two) times daily.        Allergies:  Allergies  Allergen Reactions  .  Cephalexin Other (See Comments)  . Codeine   . Penicillins   . Sulfa Antibiotics     Family History: Family History  Problem Relation Age of Onset  . Peripheral vascular disease Mother   . Heart disease Mother   . COPD Mother   . Dementia Father   . ALS Brother   . Heart disease Brother     s/p CABG    Social History:  reports that she has never smoked. She has never used smokeless tobacco. She reports that she does not drink alcohol or use illicit drugs.  ROS:                                        Physical Exam: There were no vitals taken for this visit.    Laboratory Data: Lab Results  Component Value Date   WBC 9.4 09/05/2015   HGB 14.2 09/05/2015   HCT 42.8 09/05/2015   MCV 91.3 09/05/2015   PLT 177.0 09/05/2015    Lab Results  Component Value Date   CREATININE 0.86 01/22/2016    No results found for: PSA  No results found for: TESTOSTERONE  Lab Results  Component Value Date   HGBA1C 7.5* 01/22/2016    Urinalysis    Component Value Date/Time   COLORURINE YELLOW 04/06/2014 1348   COLORURINE Straw 11/17/2011 1418   APPEARANCEUR Clear 11/23/2015 0928   APPEARANCEUR CLEAR 04/06/2014 1348   APPEARANCEUR Clear 11/17/2011 1418   LABSPEC >=1.030* 04/06/2014 1348   LABSPEC 1.008 11/17/2011 1418   PHURINE 5.5 04/06/2014 1348   PHURINE 6.0 11/17/2011 1418   GLUCOSEU Negative 11/23/2015 0928   GLUCOSEU NEGATIVE 04/06/2014 1348   GLUCOSEU Negative 11/17/2011 1418   HGBUR NEGATIVE 04/06/2014 1348   HGBUR Negative 11/17/2011 1418   BILIRUBINUR Negative 11/23/2015 0928   BILIRUBINUR neg 09/05/2015 1324   BILIRUBINUR NEGATIVE 04/06/2014 1348   BILIRUBINUR Negative 11/17/2011 1418   KETONESUR NEGATIVE 04/06/2014 1348   KETONESUR Trace 11/17/2011 1418   PROTEINUR Negative 11/23/2015 0928   PROTEINUR neg 09/05/2015 1324   PROTEINUR Negative 11/17/2011 1418   UROBILINOGEN 0.2 09/05/2015 1324   UROBILINOGEN 0.2 04/06/2014 1348    NITRITE  Negative 11/23/2015 0928   NITRITE neg 09/05/2015 1324   NITRITE NEGATIVE 04/06/2014 1348   NITRITE Negative 11/17/2011 1418   LEUKOCYTESUR Trace* 11/23/2015 0928   LEUKOCYTESUR Negative 09/05/2015 1324   LEUKOCYTESUR Negative 11/17/2011 1418    Pertinent Imaging: none  Assessment & Plan:  The patient has good days and bad days but overall is doing very well. Prescription renewed. See in one year  There are no diagnoses linked to this encounter.  No Follow-up on file.  Reece Packer, MD  [facility] [address], [place], [place] [address] [phone]

## 2016-03-04 LAB — URINALYSIS, COMPLETE
Bilirubin, UA: NEGATIVE
GLUCOSE, UA: NEGATIVE
KETONES UA: NEGATIVE
NITRITE UA: NEGATIVE
Protein, UA: NEGATIVE
RBC UA: NEGATIVE
SPEC GRAV UA: 1.02 (ref 1.005–1.030)
UUROB: 0.2 mg/dL (ref 0.2–1.0)
pH, UA: 5 (ref 5.0–7.5)

## 2016-03-04 LAB — MICROSCOPIC EXAMINATION: RBC MICROSCOPIC, UA: NONE SEEN /HPF (ref 0–?)

## 2016-03-11 DIAGNOSIS — D225 Melanocytic nevi of trunk: Secondary | ICD-10-CM | POA: Diagnosis not present

## 2016-03-11 DIAGNOSIS — L718 Other rosacea: Secondary | ICD-10-CM | POA: Diagnosis not present

## 2016-03-11 DIAGNOSIS — D2272 Melanocytic nevi of left lower limb, including hip: Secondary | ICD-10-CM | POA: Diagnosis not present

## 2016-03-11 DIAGNOSIS — Z85828 Personal history of other malignant neoplasm of skin: Secondary | ICD-10-CM | POA: Diagnosis not present

## 2016-03-18 ENCOUNTER — Encounter: Payer: Self-pay | Admitting: Internal Medicine

## 2016-03-18 ENCOUNTER — Ambulatory Visit (INDEPENDENT_AMBULATORY_CARE_PROVIDER_SITE_OTHER): Payer: Medicare Other | Admitting: Internal Medicine

## 2016-03-18 VITALS — BP 144/68 | HR 67 | Ht 67.0 in | Wt 270.0 lb

## 2016-03-18 DIAGNOSIS — J441 Chronic obstructive pulmonary disease with (acute) exacerbation: Secondary | ICD-10-CM

## 2016-03-18 MED ORDER — PREDNISONE 50 MG PO TABS: 50.0000 mg | ORAL_TABLET | Freq: Every day | ORAL | Status: DC

## 2016-03-18 MED ORDER — AZITHROMYCIN 250 MG PO TABS: ORAL_TABLET | ORAL | Status: DC

## 2016-03-18 NOTE — Progress Notes (Signed)
[facility] Consultation     Date: 03/18/2016,   MRN# [id number] Darlene Robbins Jun 12, 1941 Code Status:  Hosp day:'@LENGTHOFSTAYDAYS'$ @ Referring MD: '@ATDPROV'$ @     PCP:      AdmissionWeight: 270 lb (122.471 kg)                 CurrentWeight: 270 lb (122.471 kg) Darlene Robbins is a 75 y.o. old female seen in consultation for follow up SOB and acute bronchitis. PFT's1/2015 Ratio 66%, FEV1 70% PFT 12/2015 Ratio 79% FEv1 78%  SYNOPSIS: moderate COPD from second hand smoke  CC: follow up SOB,wheezing HPI Having wheezing and SOB and productive cough Back on advair Mild COPD exacerbation today   HHP HPI H  Current outpatient prescriptions:  .  ACCU-CHEK FASTCLIX LANCETS MISC, 1 kit by Does not apply route 2 (two) times daily., Disp: 102 each, Rfl: 12 .  ADVAIR DISKUS 250-50 MCG/DOSE AEPB, Inhale 1 puff into the lungs 2 (two) times daily., Disp: 1 each, Rfl: 10 .  albuterol (VENTOLIN HFA) 108 (90 BASE) MCG/ACT inhaler, Inhale 2 puffs into the lungs daily. (Patient taking differently: Inhale 2 puffs into the lungs every 4 (four) hours as needed. ), Disp: 36 g, Rfl: 12 .  Blood Glucose Calibration (ACCU-CHEK AVIVA) SOLN, 1 each by In Vitro route once., Disp: 1 each, Rfl: 2 .  Blood Glucose Monitoring Suppl (ONE TOUCH ULTRA SYSTEM KIT) W/DEVICE KIT, 1 kit by Does not apply route once. Use as directed. Test blood sugars 1-2 times daily., Disp: 1 each, Rfl: 0 .  cetirizine (ZYRTEC ALLERGY) 10 MG tablet, Take 1 tablet (10 mg total) by mouth daily., Disp: 90 tablet, Rfl: 12 .  diclofenac sodium (VOLTAREN) 1 % GEL, Apply 1 application topically 2 (two) times daily as needed.  , Disp: , Rfl:  .  fluorouracil (EFUDEX) 5 % cream, APP AA BID FOR 4 WEEKS, Disp: , Rfl: 0 .  furosemide (LASIX) 20 MG tablet, Take 1 tablet (20 mg total) by mouth daily as needed., Disp: 90 tablet, Rfl: 4 .  gentamicin ointment (GARAMYCIN) 0.1 %, Apply 1 application topically 3 (three) times  daily. (Patient taking differently: Apply 1 application topically 3 (three) times daily as needed. ), Disp: 30 g, Rfl: 3 .  glucose blood test strip, 1 each by Other route as needed for other. Use as instructed with One Touch Ultra Glucometer. Test blood sugars at home 1-2 times daily, Disp: 100 each, Rfl: 3 .  hydrocortisone 2.5 % cream, APPLY TO THE AFFECTED ON FACE BID PRN., Disp: , Rfl: 4 .  levothyroxine (SYNTHROID, LEVOTHROID) 75 MCG tablet, Take 1 tablet (75 mcg total) by mouth daily., Disp: 90 tablet, Rfl: 0 .  metFORMIN (GLUCOPHAGE) 500 MG tablet, Take 1 tablet (500 mg total) by mouth 2 (two) times daily with a meal., Disp: 180 tablet, Rfl: 3 .  mirabegron ER (MYRBETRIQ) 50 MG TB24 tablet, Take 1 tablet (50 mg total) by mouth daily., Disp: 90 tablet, Rfl: 3 .  nystatin (MYCOSTATIN) 100000 UNIT/ML suspension, , Disp: , Rfl:  .  nystatin (MYCOSTATIN/NYSTOP) 100000 UNIT/GM POWD, Apply twice daily as needed, Disp: 60 g, Rfl: 3 .  ondansetron (ZOFRAN) 8 MG tablet, Take 1 tablet (8 mg total) by mouth every 8 (eight) hours as needed for nausea or vomiting., Disp: 30 tablet, Rfl: 3 .  pantoprazole (PROTONIX) 40 MG tablet, Take 1 tablet (40 mg total) by mouth 2 (two) times daily., Disp: 180 tablet, Rfl: 3  ALLERGIES   Cephalexin; Codeine; Penicillins; and Sulfa antibiotics     REVIEW OF SYSTEMS   Review of Systems  Constitutional: Negative for fever, chills, weight loss and malaise/fatigue.  HENT: Positive for congestion.   Respiratory: Positive for cough, sputum production, shortness of breath and wheezing. Negative for hemoptysis.   Cardiovascular: Negative for leg swelling.  Gastrointestinal: Negative.   Neurological: Negative.   All other systems reviewed and are negative.    VS: BP 144/68 mmHg  Pulse 67  Ht '5\' 7"'$  (1.702 m)  Wt 270 lb (122.471 kg)  BMI 42.28 kg/m2  SpO2 97%     PHYSICAL EXAM   Physical Exam  Constitutional: She is oriented to person, place, and  time. No distress.  HENT:  Head: Normocephalic and atraumatic.  Cardiovascular: Normal rate, regular rhythm and normal heart sounds.   No murmur heard. Pulmonary/Chest: No respiratory distress. She has no wheezes.  Musculoskeletal: Normal range of motion. She exhibits edema.  Neurological: She is alert and oriented to person, place, and time.  Skin: She is not diaphoretic.  Psychiatric: She has a normal mood and affect.        ASSESSMENT/PLAN   75 yo obese white female seen today for follow up for Moderate COPD Grade B now with mild COPD exacerbation   1.continue  ADVAIR 2.albuterol as needed 3.continue zyrtec 4.will start oral prednisone 50 mg daily for 5 days and Z pack  Follow up 3 month  The Patient requires high complexity decision making for assessment and support, frequent evaluation and titration of therapies, application of advanced monitoring technologies and extensive interpretation of multiple databases.   Patient satisfied with Plan of action and management. All questions answered   Corrin Parker, M.D.  [facility]  Medical Director [place] Director [facility] Department

## 2016-03-18 NOTE — Patient Instructions (Signed)
Chronic Obstructive Pulmonary Disease Chronic obstructive pulmonary disease (COPD) is a common lung condition in which airflow from the lungs is limited. COPD is a general term that can be used to describe many different lung problems that limit airflow, including both chronic bronchitis and emphysema. If you have COPD, your lung function will probably never return to normal, but there are measures you can take to improve lung function and make yourself feel better. CAUSES   Smoking (common).  Exposure to secondhand smoke.  Genetic problems.  Chronic inflammatory lung diseases or recurrent infections. SYMPTOMS  Shortness of breath, especially with physical activity.  Deep, persistent (chronic) cough with a large amount of thick mucus.  Wheezing.  Rapid breaths (tachypnea).  Gray or bluish discoloration (cyanosis) of the skin, especially in your fingers, toes, or lips.  Fatigue.  Weight loss.  Frequent infections or episodes when breathing symptoms become much worse (exacerbations).  Chest tightness. DIAGNOSIS Your health care provider will take a medical history and perform a physical examination to diagnose COPD. Additional tests for COPD may include:  Lung (pulmonary) function tests.  Chest X-ray.  CT scan.  Blood tests. TREATMENT  Treatment for COPD may include:  Inhaler and nebulizer medicines. These help manage the symptoms of COPD and make your breathing more comfortable.  Supplemental oxygen. Supplemental oxygen is only helpful if you have a low oxygen level in your blood.  Exercise and physical activity. These are beneficial for nearly all people with COPD.  Lung surgery or transplant.  Nutrition therapy to gain weight, if you are underweight.  Pulmonary rehabilitation. This may involve working with a team of health care providers and specialists, such as respiratory, occupational, and physical therapists. HOME CARE INSTRUCTIONS  Take all medicines  (inhaled or pills) as directed by your health care provider.  Avoid over-the-counter medicines or cough syrups that dry up your airway (such as antihistamines) and slow down the elimination of secretions unless instructed otherwise by your health care provider.  If you are a smoker, the most important thing that you can do is stop smoking. Continuing to smoke will cause further lung damage and breathing trouble. Ask your health care provider for help with quitting smoking. He or she can direct you to community resources or hospitals that provide support.  Avoid exposure to irritants such as smoke, chemicals, and fumes that aggravate your breathing.  Use oxygen therapy and pulmonary rehabilitation if directed by your health care provider. If you require home oxygen therapy, ask your health care provider whether you should purchase a pulse oximeter to measure your oxygen level at home.  Avoid contact with individuals who have a contagious illness.  Avoid extreme temperature and humidity changes.  Eat healthy foods. Eating smaller, more frequent meals and resting before meals may help you maintain your strength.  Stay active, but balance activity with periods of rest. Exercise and physical activity will help you maintain your ability to do things you want to do.  Preventing infection and hospitalization is very important when you have COPD. Make sure to receive all the vaccines your health care provider recommends, especially the pneumococcal and influenza vaccines. Ask your health care provider whether you need a pneumonia vaccine.  Learn and use relaxation techniques to manage stress.  Learn and use controlled breathing techniques as directed by your health care provider. Controlled breathing techniques include:  Pursed lip breathing. Start by breathing in (inhaling) through your nose for 1 second. Then, purse your lips as if you were   going to whistle and breathe out (exhale) through the  pursed lips for 2 seconds.  Diaphragmatic breathing. Start by putting one hand on your abdomen just above your waist. Inhale slowly through your nose. The hand on your abdomen should move out. Then purse your lips and exhale slowly. You should be able to feel the hand on your abdomen moving in as you exhale.  Learn and use controlled coughing to clear mucus from your lungs. Controlled coughing is a series of short, progressive coughs. The steps of controlled coughing are: 1. Lean your head slightly forward. 2. Breathe in deeply using diaphragmatic breathing. 3. Try to hold your breath for 3 seconds. 4. Keep your mouth slightly open while coughing twice. 5. Spit any mucus out into a tissue. 6. Rest and repeat the steps once or twice as needed. SEEK MEDICAL CARE IF:  You are coughing up more mucus than usual.  There is a change in the color or thickness of your mucus.  Your breathing is more labored than usual.  Your breathing is faster than usual. SEEK IMMEDIATE MEDICAL CARE IF:  You have shortness of breath while you are resting.  You have shortness of breath that prevents you from:  Being able to talk.  Performing your usual physical activities.  You have chest pain lasting longer than 5 minutes.  Your skin color is more cyanotic than usual.  You measure low oxygen saturations for longer than 5 minutes with a pulse oximeter. MAKE SURE YOU:  Understand these instructions.  Will watch your condition.  Will get help right away if you are not doing well or get worse.   This information is not intended to replace advice given to you by your health care provider. Make sure you discuss any questions you have with your health care provider.   Document Released: 08/06/2005 Document Revised: 11/17/2014 Document Reviewed: 06/23/2013 Elsevier Interactive Patient Education 2016 [employer] Inc[employer]  

## 2016-03-24 DIAGNOSIS — G4733 Obstructive sleep apnea (adult) (pediatric): Secondary | ICD-10-CM | POA: Diagnosis not present

## 2016-03-24 DIAGNOSIS — E65 Localized adiposity: Secondary | ICD-10-CM | POA: Insufficient documentation

## 2016-03-24 DIAGNOSIS — E782 Mixed hyperlipidemia: Secondary | ICD-10-CM | POA: Diagnosis not present

## 2016-03-24 DIAGNOSIS — I10 Essential (primary) hypertension: Secondary | ICD-10-CM | POA: Diagnosis not present

## 2016-05-08 ENCOUNTER — Ambulatory Visit: Payer: Medicare Other | Admitting: Internal Medicine

## 2016-05-14 ENCOUNTER — Encounter: Payer: Self-pay | Admitting: Internal Medicine

## 2016-05-14 ENCOUNTER — Ambulatory Visit (INDEPENDENT_AMBULATORY_CARE_PROVIDER_SITE_OTHER): Payer: Medicare Other | Admitting: Internal Medicine

## 2016-05-14 VITALS — BP 164/74 | HR 64 | Ht 67.0 in | Wt 266.6 lb

## 2016-05-14 DIAGNOSIS — I10 Essential (primary) hypertension: Secondary | ICD-10-CM

## 2016-05-14 DIAGNOSIS — Z1239 Encounter for other screening for malignant neoplasm of breast: Secondary | ICD-10-CM

## 2016-05-14 DIAGNOSIS — J42 Unspecified chronic bronchitis: Secondary | ICD-10-CM | POA: Diagnosis not present

## 2016-05-14 DIAGNOSIS — E039 Hypothyroidism, unspecified: Secondary | ICD-10-CM

## 2016-05-14 DIAGNOSIS — E118 Type 2 diabetes mellitus with unspecified complications: Secondary | ICD-10-CM | POA: Diagnosis not present

## 2016-05-14 LAB — MICROALBUMIN / CREATININE URINE RATIO
CREATININE, U: 98.7 mg/dL
MICROALB UR: 0.7 mg/dL (ref 0.0–1.9)
MICROALB/CREAT RATIO: 0.7 mg/g (ref 0.0–30.0)

## 2016-05-14 LAB — COMPREHENSIVE METABOLIC PANEL
ALT: 21 U/L (ref 0–35)
AST: 25 U/L (ref 0–37)
Albumin: 4.3 g/dL (ref 3.5–5.2)
Alkaline Phosphatase: 82 U/L (ref 39–117)
BILIRUBIN TOTAL: 0.7 mg/dL (ref 0.2–1.2)
BUN: 14 mg/dL (ref 6–23)
CALCIUM: 9.7 mg/dL (ref 8.4–10.5)
CO2: 28 meq/L (ref 19–32)
Chloride: 105 mEq/L (ref 96–112)
Creatinine, Ser: 0.89 mg/dL (ref 0.40–1.20)
GFR: 65.79 mL/min (ref >60.00)
Glucose, Bld: 149 mg/dL — ABNORMAL HIGH (ref 70–99)
Potassium: 4.7 mEq/L (ref 3.5–5.1)
Sodium: 140 mEq/L (ref 135–145)
Total Protein: 6.9 g/dL (ref 6.0–8.3)

## 2016-05-14 LAB — LIPID PANEL
Cholesterol: 182 mg/dL (ref 0–200)
HDL: 46.1 mg/dL (ref >39.00)
LDL Cholesterol: 106 mg/dL — ABNORMAL HIGH (ref 0–99)
NonHDL: 136.05
Total CHOL/HDL Ratio: 4
Triglycerides: 148 mg/dL (ref 0.0–149.0)
VLDL: 29.6 mg/dL (ref 0.0–40.0)

## 2016-05-14 LAB — TSH: TSH: 2.08 u[IU]/mL (ref 0.35–4.50)

## 2016-05-14 LAB — HEMOGLOBIN A1C: Hgb A1c MFr Bld: 7.1 % — ABNORMAL HIGH (ref 4.6–6.5)

## 2016-05-14 MED ORDER — METFORMIN HCL 500 MG PO TABS: 500.0000 mg | ORAL_TABLET | Freq: Two times a day (BID) | ORAL | Status: AC

## 2016-05-14 MED ORDER — PANTOPRAZOLE SODIUM 40 MG PO TBEC: 40.0000 mg | DELAYED_RELEASE_TABLET | Freq: Two times a day (BID) | ORAL | Status: AC

## 2016-05-14 MED ORDER — LEVOTHYROXINE SODIUM 75 MCG PO TABS: 75.0000 ug | ORAL_TABLET | Freq: Every day | ORAL | Status: AC

## 2016-05-14 NOTE — Patient Instructions (Signed)
Labs today.   Follow up in 3 months.  

## 2016-05-14 NOTE — Assessment & Plan Note (Signed)
BP Readings from Last 3 Encounters:  05/14/16 164/74  03/18/16 144/68  03/03/16 168/82   BP slightly elevated today, but generally has been better controlled. Will monitor for now. Renal function with labs.

## 2016-05-14 NOTE — Progress Notes (Signed)
Pre visit review using our clinic review tool, if applicable. No additional management support is needed unless otherwise documented below in the visit note. 

## 2016-05-14 NOTE — Assessment & Plan Note (Signed)
BG well controlled generally. Will check A1c with labs. Continue Metformin

## 2016-05-14 NOTE — Progress Notes (Signed)
Subjective:    Patient ID: Darlene Robbins, female    DOB: 1941/11/04, 75 y.o.   MRN: [id number]  HPI  75YO female presents for follow up.  Feeling well.  DM - Worried her BG may be up, as recently on Prednisone for COPD. BG mostly near 80s to 140s.  COPD - Symptoms improved after Azithromycin and Prednisone. Recent. No current cough, dyspnea.   Wt Readings from Last 3 Encounters:  05/14/16 266 lb 9.6 oz (120.929 kg)  03/18/16 270 lb (122.471 kg)  03/03/16 269 lb 11.2 oz (122.335 kg)   BP Readings from Last 3 Encounters:  05/14/16 164/74  03/18/16 144/68  03/03/16 168/82    Past Medical History  Diagnosis Date  . Thyroid disease   . Kidney stones     laser surgery in the past  . COPD (chronic obstructive pulmonary disease) ([place])     followed by Dr.Fleming  . Arthritis     uses celebrex  . Sleep apnea     CPAP 7?  . Abdominal pain     Resolved,MRSA and yeast likely colonization in setting of multiple antibiotics.  . Vitamin D deficiency 07-11-2010    drisdol 50,000 units 1 weekly x 12 weeks no refill active  . Hypertension 07/30/2012  . Varicose veins 09/05/2015  . Benign essential HTN 03/02/2015  . Heart valve disease 10/17/2015  . Leg varices 04/25/2014   Family History  Problem Relation Age of Onset  . Peripheral vascular disease Mother   . Heart disease Mother   . COPD Mother   . Dementia Father   . ALS Brother   . Heart disease Brother     s/p CABG   Past Surgical History  Procedure Laterality Date  . Straighten nasal septum      Dr.McQueen  . Laser surgery for kidney stones      followed by Dr.Humphries  . Vein surgery      Dr.Schnier  . Cataract extraction Bilateral     Dr. Rosana Berger  . Tonsillectomy     Social History   Social History  . Marital Status: Widowed    Spouse Name: N/A  . Number of Children: N/A  . Years of Education: N/A   Social History Main Topics  . Smoking status: Never Smoker   . Smokeless tobacco: Never Used    . Alcohol Use: No  . Drug Use: No  . Sexual Activity: Not Asked   Other Topics Concern  . None   Social History Narrative   Lives with son.    Review of Systems  Constitutional: Negative for fever, chills, appetite change, fatigue and unexpected weight change.  Eyes: Negative for visual disturbance.  Respiratory: Negative for cough and shortness of breath.   Cardiovascular: Negative for chest pain and leg swelling.  Gastrointestinal: Negative for nausea, vomiting, abdominal pain, diarrhea and constipation.  Musculoskeletal: Positive for arthralgias. Negative for myalgias.  Skin: Negative for color change and rash.  Hematological: Negative for adenopathy. Does not bruise/bleed easily.  Psychiatric/Behavioral: Negative for suicidal ideas, sleep disturbance and dysphoric mood. The patient is not nervous/anxious.        Objective:    BP 164/74 mmHg  Pulse 64  Ht 5\' 7"  (1.702 m)  Wt 266 lb 9.6 oz (120.929 kg)  BMI 41.75 kg/m2  SpO2 98% Physical Exam  Constitutional: She is oriented to person, place, and time. She appears well-developed and well-nourished. No distress.  HENT:  Head: Normocephalic and atraumatic.  Right Ear:  External ear normal.  Left Ear: External ear normal.  Nose: Nose normal.  Mouth/Throat: Oropharynx is clear and moist. No oropharyngeal exudate.  Eyes: Conjunctivae are normal. Pupils are equal, round, and reactive to light. Right eye exhibits no discharge. Left eye exhibits no discharge. No scleral icterus.  Neck: Normal range of motion. Neck supple. No tracheal deviation present. No thyromegaly present.  Cardiovascular: Normal rate, regular rhythm, normal heart sounds and intact distal pulses.  Exam reveals no gallop and no friction rub.   No murmur heard. Pulmonary/Chest: Effort normal and breath sounds normal. No respiratory distress. She has no wheezes. She has no rales. She exhibits no tenderness.  Musculoskeletal: Normal range of motion. She  exhibits no edema or tenderness.  Lymphadenopathy:    She has no cervical adenopathy.  Neurological: She is alert and oriented to person, place, and time. No cranial nerve deficit. She exhibits normal muscle tone. Coordination normal.  Skin: Skin is warm and dry. No rash noted. She is not diaphoretic. No erythema. No pallor.  Psychiatric: She has a normal mood and affect. Her behavior is normal. Judgment and thought content normal.          Assessment & Plan:   Problem List Items Addressed This Visit      High   COPD (chronic obstructive pulmonary disease) ([place])    Symptoms improved after recent COPD exacerbation. Exam normal today. Continue Advair and prn Albuterol      Diabetes type 2, controlled ([place]) - Primary    BG well controlled generally. Will check A1c with labs. Continue Metformin      Relevant Medications   metFORMIN (GLUCOPHAGE) 500 MG tablet   Other Relevant Orders   Comprehensive metabolic panel   Hemoglobin A1c   Lipid panel   Microalbumin / creatinine urine ratio     Unprioritized   Benign essential HTN (Chronic)    BP Readings from Last 3 Encounters:  05/14/16 164/74  03/18/16 144/68  03/03/16 168/82   BP slightly elevated today, but generally has been better controlled. Will monitor for now. Renal function with labs.      Hypothyroidism    Will check TSH with labs. Continue Levothyroxine.      Relevant Medications   levothyroxine (SYNTHROID, LEVOTHROID) 75 MCG tablet   Other Relevant Orders   TSH    Other Visit Diagnoses    Screening for breast cancer        Relevant Orders    MM Digital Screening        Return in about 3 months (around 08/14/2016) for New Patient.  Ronette Deter, MD Internal Medicine [facility]

## 2016-05-14 NOTE — Assessment & Plan Note (Signed)
Symptoms improved after recent COPD exacerbation. Exam normal today. Continue Advair and prn Albuterol

## 2016-05-14 NOTE — Assessment & Plan Note (Signed)
Will check TSH with labs. Continue Levothyroxine. 

## 2016-06-26 ENCOUNTER — Ambulatory Visit: Payer: Medicare Other | Admitting: Internal Medicine

## 2016-07-01 ENCOUNTER — Ambulatory Visit: Payer: Medicare Other | Admitting: Internal Medicine

## 2016-07-23 ENCOUNTER — Ambulatory Visit: Payer: Medicare Other

## 2016-08-12 ENCOUNTER — Ambulatory Visit
Admission: RE | Admit: 2016-08-12 | Discharge: 2016-08-12 | Disposition: A | Discharge status: Home / Self Care | Payer: Medicare Other | Source: Ambulatory Visit | Attending: Internal Medicine | Admitting: Internal Medicine

## 2016-08-12 DIAGNOSIS — Z1231 Encounter for screening mammogram for malignant neoplasm of breast: Secondary | ICD-10-CM | POA: Insufficient documentation

## 2016-08-12 DIAGNOSIS — Z1239 Encounter for other screening for malignant neoplasm of breast: Secondary | ICD-10-CM

## 2016-09-22 DIAGNOSIS — E785 Hyperlipidemia, unspecified: Secondary | ICD-10-CM | POA: Diagnosis not present

## 2016-09-22 DIAGNOSIS — J449 Chronic obstructive pulmonary disease, unspecified: Secondary | ICD-10-CM | POA: Diagnosis not present

## 2016-09-22 DIAGNOSIS — G4733 Obstructive sleep apnea (adult) (pediatric): Secondary | ICD-10-CM | POA: Diagnosis not present

## 2016-09-22 DIAGNOSIS — E139 Other specified diabetes mellitus without complications: Secondary | ICD-10-CM | POA: Diagnosis not present

## 2016-09-22 DIAGNOSIS — K219 Gastro-esophageal reflux disease without esophagitis: Secondary | ICD-10-CM | POA: Diagnosis not present

## 2016-09-22 DIAGNOSIS — Z23 Encounter for immunization: Secondary | ICD-10-CM | POA: Diagnosis not present

## 2016-09-22 DIAGNOSIS — N3281 Overactive bladder: Secondary | ICD-10-CM | POA: Diagnosis not present

## 2016-10-12 ENCOUNTER — Encounter: Payer: Self-pay | Admitting: Emergency Medicine

## 2016-10-12 ENCOUNTER — Ambulatory Visit
Admission: EM | Admit: 2016-10-12 | Discharge: 2016-10-12 | Disposition: A | Discharge status: Home / Self Care | Payer: Medicare Other | Attending: Family Medicine | Admitting: Family Medicine

## 2016-10-12 DIAGNOSIS — J0100 Acute maxillary sinusitis, unspecified: Secondary | ICD-10-CM

## 2016-10-12 MED ORDER — BENZONATATE 100 MG PO CAPS: 100.0000 mg | ORAL_CAPSULE | Freq: Three times a day (TID) | ORAL | 0 refills | Status: DC

## 2016-10-12 MED ORDER — DOXYCYCLINE MONOHYDRATE 100 MG PO CAPS: 100.0000 mg | ORAL_CAPSULE | Freq: Two times a day (BID) | ORAL | 0 refills | Status: DC

## 2016-10-12 NOTE — ED Triage Notes (Signed)
Patient c/o cough, chest congestion since Friday.  Patient reports runny nose for a week.

## 2016-10-12 NOTE — ED Provider Notes (Signed)
[facility]    CSN: [id number] Arrival date & time: 10/12/16  0913     History   Chief Complaint Chief Complaint  Patient presents with  . Cough    HPI Darlene Robbins is a 75 y.o. female.   The history is provided by the patient.  Cough  Associated symptoms: fever, headaches and wheezing   URI  Presenting symptoms: congestion, cough, facial pain, fatigue and fever   Severity:  Moderate Onset quality:  Sudden Duration:  5 days Timing:  Constant Progression:  Worsening Chronicity:  New Relieved by:  Nothing Ineffective treatments:  OTC medications Associated symptoms: headaches, sinus pain and wheezing   Risk factors: being elderly, chronic respiratory disease, diabetes mellitus and sick contacts   Risk factors: no immunosuppression, no recent illness and no recent travel     Past Medical History:  Diagnosis Date  . Abdominal pain    Resolved,MRSA and yeast likely colonization in setting of multiple antibiotics.  . Arthritis    uses celebrex  . Benign essential HTN 03/02/2015  . COPD (chronic obstructive pulmonary disease) ([place])    followed by Dr.Fleming  . Heart valve disease 10/17/2015  . Hypertension 07/30/2012  . Kidney stones    laser surgery in the past  . Leg varices 04/25/2014  . Sleep apnea    CPAP 7?  . Thyroid disease   . Varicose veins 09/05/2015  . Vitamin D deficiency 07-11-2010   drisdol 50,000 units 1 weekly x 12 weeks no refill active    Patient Active Problem List   Diagnosis Date Noted  . Nausea with vomiting 12/03/2015  . COPD exacerbation ([place]) 10/30/2015  . Thrush 10/30/2015  . Edema leg 10/17/2015  . Heart valve disease 10/17/2015  . 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 Varicose veins 09/05/2015  . Abdominal wall cramping 09/05/2015  . Benign essential HTN 03/02/2015  . Obstructive apnea 08/24/2014  . Severe obesity (BMI >= 40) ([place]) 06/29/2014  . Leg varices 04/25/2014  .  Microscopic hematuria 04/06/2014  . Diabetes type 2, controlled ([place]) 06/16/2013  . Medicare annual wellness visit, subsequent 06/16/2013  . COPD (chronic obstructive pulmonary disease) ([place]) 03/21/2013  . Obesity 03/11/2013  . Breath shortness 03/11/2013  . Calculus of kidney 08/04/2012  . Urge incontinence of urine 08/04/2012  . Hypertension 07/30/2012  . Arthritis 09/04/2011  . Hypothyroidism 07/02/2011  . GERD (gastroesophageal reflux disease) 07/02/2011    Past Surgical History:  Procedure Laterality Date  . CATARACT EXTRACTION Bilateral    Dr. Rosana Berger  . laser surgery for kidney stones     followed by Dr.Humphries  . straighten nasal septum     Dr.McQueen  . TONSILLECTOMY    . VEIN SURGERY     Dr.Schnier    OB History    No data available       Home Medications    Prior to Admission medications   Medication Sig Start Date End Date Taking? Authorizing Provider  ACCU-CHEK FASTCLIX LANCETS MISC 1 kit by Does not apply route 2 (two) times daily. 01/30/16   [name], MD  ADVAIR DISKUS 250-50 MCG/DOSE AEPB Inhale 1 puff into the lungs 2 (two) times daily. 12/27/15 12/26/16  Flora Lipps, MD  albuterol (VENTOLIN HFA) 108 (90 BASE) MCG/ACT inhaler Inhale 2 puffs into the lungs daily. Patient taking differently: Inhale 2 puffs into the lungs every 4 (four) hours as needed.  09/07/15   Flora Lipps, MD  benzonatate (TESSALON) 100 MG  capsule Take 1 capsule (100 mg total) by mouth every 8 (eight) hours. 10/12/16   Norval Gable, MD  Blood Glucose Calibration (ACCU-CHEK AVIVA) SOLN 1 each by In Vitro route once. 05/17/13   [name], MD  Blood Glucose Monitoring Suppl (ONE TOUCH ULTRA SYSTEM KIT) W/DEVICE KIT 1 kit by Does not apply route once. Use as directed. Test blood sugars 1-2 times daily. 02/05/15   [name], MD  cetirizine (ZYRTEC ALLERGY) 10 MG tablet Take 1 tablet (10 mg total) by mouth daily. 11/28/15   Flora Lipps, MD  diclofenac sodium (VOLTAREN) 1 %  GEL Apply 1 application topically 2 (two) times daily as needed.      Historical Provider, MD  doxycycline (MONODOX) 100 MG capsule Take 1 capsule (100 mg total) by mouth 2 (two) times daily. 10/12/16   Norval Gable, MD  fluorouracil (EFUDEX) 5 % cream APP AA BID FOR 4 WEEKS 11/06/15   Historical Provider, MD  furosemide (LASIX) 20 MG tablet Take 1 tablet (20 mg total) by mouth daily as needed. 09/06/12   [name], MD  gentamicin ointment (GARAMYCIN) 0.1 % Apply 1 application topically 3 (three) times daily. Patient taking differently: Apply 1 application topically 3 (three) times daily as needed.  08/22/15   [name], MD  glucose blood test strip 1 each by Other route as needed for other. Use as instructed with One Touch Ultra Glucometer. Test blood sugars at home 1-2 times daily 02/05/15   [name], MD  hydrocortisone 2.5 % cream APPLY TO THE AFFECTED ON FACE BID PRN. 10/17/15   Historical Provider, MD  levothyroxine (SYNTHROID, LEVOTHROID) 75 MCG tablet Take 1 tablet (75 mcg total) by mouth daily. 05/14/16   [name], MD  metFORMIN (GLUCOPHAGE) 500 MG tablet Take 1 tablet (500 mg total) by mouth 2 (two) times daily with a meal. 05/14/16 05/14/17  [name], MD  mirabegron ER (MYRBETRIQ) 50 MG TB24 tablet Take 1 tablet (50 mg total) by mouth daily. 03/03/16   Bjorn Loser, MD  nystatin (MYCOSTATIN) 100000 UNIT/ML suspension  10/29/15   Historical Provider, MD  nystatin (MYCOSTATIN/NYSTOP) 100000 UNIT/GM POWD Apply twice daily as needed 09/19/15   [name], MD  ondansetron (ZOFRAN) 8 MG tablet Take 1 tablet (8 mg total) by mouth every 8 (eight) hours as needed for nausea or vomiting. 12/03/15   [name], MD  pantoprazole (PROTONIX) 40 MG tablet Take 1 tablet (40 mg total) by mouth 2 (two) times daily. 05/14/16 05/14/17  [name], MD    Family History Family History  Problem Relation Age of Onset  . Peripheral vascular disease Mother     . Heart disease Mother   . COPD Mother   . Dementia Father   . ALS Brother   . Heart disease Brother     s/p CABG  . Breast cancer Cousin   . Breast cancer Cousin     Social History Social History  Substance Use Topics  . Smoking status: Never Smoker  . Smokeless tobacco: Never Used  . Alcohol use No     Allergies   Cephalexin; Codeine; Penicillins; and Sulfa antibiotics   Review of Systems Review of Systems  Constitutional: Positive for fatigue and fever.  HENT: Positive for congestion and sinus pain.   Respiratory: Positive for cough and wheezing.   Neurological: Positive for headaches.     Physical Exam Triage Vital Signs ED Triage Vitals  Enc  Vitals Group     BP 10/12/16 0943 (S) (!) 165/64     Pulse Rate 10/12/16 0943 77     Resp 10/12/16 0943 16     Temp 10/12/16 0943 98 F (36.7 C)     Temp Source 10/12/16 0943 Oral     SpO2 10/12/16 0943 99 %     Weight 10/12/16 0943 260 lb (117.9 kg)     Height 10/12/16 0943 _0  (1.702 m)     Head Circumference --      Peak Flow --      Pain Score 10/12/16 0945 0     Pain Loc --      Pain Edu? --      Excl. in [place]? --    No data found.   Updated Vital Signs BP (S) (!) 156/68   Pulse 77   Temp 98 F (36.7 C) (Oral)   Resp 16   Ht _1  (1.702 m)   Wt 260 lb (117.9 kg)   SpO2 99%   BMI 40.72 kg/m   Visual Acuity Right Eye Distance:   Left Eye Distance:   Bilateral Distance:    Right Eye Near:   Left Eye Near:    Bilateral Near:     Physical Exam  Constitutional: She appears well-developed and well-nourished. No distress.  HENT:  Head: Normocephalic and atraumatic.  Right Ear: Tympanic membrane, external ear and ear canal normal.  Left Ear: Tympanic membrane, external ear and ear canal normal.  Nose: Mucosal edema and rhinorrhea present. No nose lacerations, sinus tenderness, nasal deformity, septal deviation or nasal septal hematoma. No epistaxis.  No foreign bodies. Right sinus exhibits  maxillary sinus tenderness and frontal sinus tenderness. Left sinus exhibits maxillary sinus tenderness and frontal sinus tenderness.  Mouth/Throat: Uvula is midline, oropharynx is clear and moist and mucous membranes are normal. No oropharyngeal exudate.  Eyes: Conjunctivae and EOM are normal. Pupils are equal, round, and reactive to light. Right eye exhibits no discharge. Left eye exhibits no discharge. No scleral icterus.  Neck: Normal range of motion. Neck supple. No thyromegaly present.  Cardiovascular: Normal rate, regular rhythm and normal heart sounds.   Pulmonary/Chest: Effort normal and breath sounds normal. No respiratory distress. She has no wheezes. She has no rales.  Lymphadenopathy:    She has no cervical adenopathy.  Skin: She is not diaphoretic.  Nursing note and vitals reviewed.    UC Treatments / Results  Labs (all labs ordered are listed, but only abnormal results are displayed) Labs Reviewed - No data to display  EKG  EKG Interpretation None       Radiology No results found.  Procedures Procedures (including critical care time)  Medications Ordered in UC Medications - No data to display   Initial Impression / Assessment and Plan / UC Course  I have reviewed the triage vital signs and the nursing notes.  Pertinent labs & imaging results that were available during my care of the patient were reviewed by me and considered in my medical decision making (see chart for details).  Clinical Course       Final Clinical Impressions(s) / UC Diagnoses   Final diagnoses:  Acute maxillary sinusitis, recurrence not specified    New Prescriptions New Prescriptions   BENZONATATE (TESSALON) 100 MG CAPSULE    Take 1 capsule (100 mg total) by mouth every 8 (eight) hours.   DOXYCYCLINE (MONODOX) 100 MG CAPSULE    Take 1 capsule (100 mg total)  by mouth 2 (two) times daily.    1. diagnosis reviewed with patient/parent/guardian/family 2. rx as per orders above;  reviewed possible side effects, interactions, risks and benefits  3. Follow-up prn if symptoms worsen or don't improve   Norval Gable, MD 10/12/16 1018

## 2016-10-15 ENCOUNTER — Telehealth: Payer: Self-pay

## 2016-10-15 NOTE — Telephone Encounter (Signed)
Called patient for courtesy call this morning. Patient states that she is not improving. Fevers have returned and cough is worsening. Advised patient that she will need to follow up here or with her primary. Patient states that she is planning on seeing pulmonologist today. Patient will call back with any questions or concerns. [facility]

## 2016-10-16 DIAGNOSIS — J209 Acute bronchitis, unspecified: Secondary | ICD-10-CM | POA: Diagnosis not present

## 2016-10-16 DIAGNOSIS — L03211 Cellulitis of face: Secondary | ICD-10-CM | POA: Diagnosis not present

## 2016-10-21 ENCOUNTER — Ambulatory Visit: Payer: Medicare Other | Admitting: Internal Medicine

## 2016-11-17 ENCOUNTER — Telehealth: Payer: Self-pay | Admitting: Internal Medicine

## 2016-11-17 ENCOUNTER — Other Ambulatory Visit
Admission: RE | Admit: 2016-11-17 | Discharge: 2016-11-17 | Disposition: A | Discharge status: Home / Self Care | Payer: Medicare Other | Source: Ambulatory Visit | Attending: Internal Medicine | Admitting: Internal Medicine

## 2016-11-17 DIAGNOSIS — R05 Cough: Secondary | ICD-10-CM

## 2016-11-17 DIAGNOSIS — R059 Cough, unspecified: Secondary | ICD-10-CM

## 2016-11-17 DIAGNOSIS — R509 Fever, unspecified: Secondary | ICD-10-CM

## 2016-11-17 LAB — RAPID INFLUENZA A&B ANTIGENS (ARMC ONLY)
INFLUENZA A (ARMC): NEGATIVE
INFLUENZA B (ARMC): NEGATIVE

## 2016-11-17 NOTE — Telephone Encounter (Signed)
Pt states she was at [facility] on 10/12/16 for congestion and coughing. Informed pt that per DK pt needs to go to PCP or UC. Pt states she has already called PCP and states they can't see her and that she has an appt with Dr. Tami Ribas on Wednesday at 2pm. States she will go to ED if she gets worse before Wednesday.   Informed DK and he says to call pt to see if she wants to get tested for the flu. Called pt and she will go and swabbed for the flu. Nothing further needed.

## 2016-11-17 NOTE — Telephone Encounter (Signed)
Pt states she thinks she is getting bronchitis. Congestion, coughing-gradually getting worse, headache, sweating.  Please call.

## 2016-11-18 MED ORDER — AZITHROMYCIN 250 MG PO TABS: ORAL_TABLET | ORAL | 0 refills | Status: AC

## 2016-11-18 NOTE — Telephone Encounter (Signed)
Per DK, flu is negative and send in [place].   Pt informed that RX sent and that if no better after then she will need to make an appt. Pt verbalized understanding. Nothing further needed.

## 2016-11-18 NOTE — Telephone Encounter (Signed)
Patient wants to know if flu swab results are in yet. Please call.

## 2016-11-18 NOTE — Addendum Note (Signed)
Addended by: Oscar La R on: 11/18/2016 01:07 PM   Modules accepted: Orders

## 2016-11-19 DIAGNOSIS — Z880 Allergy status to penicillin: Secondary | ICD-10-CM | POA: Diagnosis not present

## 2016-11-19 DIAGNOSIS — R509 Fever, unspecified: Secondary | ICD-10-CM | POA: Diagnosis not present

## 2016-11-19 DIAGNOSIS — E119 Type 2 diabetes mellitus without complications: Secondary | ICD-10-CM | POA: Diagnosis not present

## 2016-11-19 DIAGNOSIS — I10 Essential (primary) hypertension: Secondary | ICD-10-CM | POA: Diagnosis not present

## 2016-11-19 DIAGNOSIS — Z7984 Long term (current) use of oral hypoglycemic drugs: Secondary | ICD-10-CM | POA: Diagnosis not present

## 2016-11-19 DIAGNOSIS — R51 Headache: Secondary | ICD-10-CM | POA: Diagnosis not present

## 2016-11-19 DIAGNOSIS — E079 Disorder of thyroid, unspecified: Secondary | ICD-10-CM | POA: Diagnosis not present

## 2016-11-19 DIAGNOSIS — J069 Acute upper respiratory infection, unspecified: Secondary | ICD-10-CM | POA: Diagnosis not present

## 2016-11-19 DIAGNOSIS — Z7951 Long term (current) use of inhaled steroids: Secondary | ICD-10-CM | POA: Diagnosis not present

## 2016-11-19 DIAGNOSIS — B349 Viral infection, unspecified: Secondary | ICD-10-CM | POA: Diagnosis not present

## 2016-11-19 DIAGNOSIS — Z79899 Other long term (current) drug therapy: Secondary | ICD-10-CM | POA: Diagnosis not present

## 2016-11-19 DIAGNOSIS — Z885 Allergy status to narcotic agent status: Secondary | ICD-10-CM | POA: Diagnosis not present

## 2016-11-19 DIAGNOSIS — Z8249 Family history of ischemic heart disease and other diseases of the circulatory system: Secondary | ICD-10-CM | POA: Diagnosis not present

## 2016-11-19 DIAGNOSIS — M1990 Unspecified osteoarthritis, unspecified site: Secondary | ICD-10-CM | POA: Diagnosis not present

## 2016-11-19 DIAGNOSIS — Z882 Allergy status to sulfonamides status: Secondary | ICD-10-CM | POA: Diagnosis not present

## 2016-11-19 DIAGNOSIS — R05 Cough: Secondary | ICD-10-CM | POA: Diagnosis not present

## 2016-11-19 DIAGNOSIS — J449 Chronic obstructive pulmonary disease, unspecified: Secondary | ICD-10-CM | POA: Diagnosis not present

## 2016-11-19 DIAGNOSIS — K219 Gastro-esophageal reflux disease without esophagitis: Secondary | ICD-10-CM | POA: Diagnosis not present

## 2016-11-19 DIAGNOSIS — J45909 Unspecified asthma, uncomplicated: Secondary | ICD-10-CM | POA: Diagnosis not present

## 2016-12-01 ENCOUNTER — Telehealth: Payer: Self-pay | Admitting: Internal Medicine

## 2016-12-01 MED ORDER — PREDNISONE 10 MG PO TABS: ORAL_TABLET | ORAL | 0 refills | Status: DC

## 2016-12-01 NOTE — Telephone Encounter (Signed)
Pt states she is not better after finishing her zpak. States she still has a cough, coughing up green stuff and blowing green and yellow stuff. States she went to [facility] ED on 1/10, states she received a breathing treatment. States she also had a fever of 103. Please call.

## 2016-12-01 NOTE — Telephone Encounter (Signed)
She likely has the flu, abx will not work, it will have to run its course. Until then will use prednisone.   Prednisone 10 mg tabs x  42.  Take 6 tablets on day 1,2 Take 5 tablets on day 3,4 Take 4 tablets on day 5,6 Take 3 tablets on day 7,8 Take 2 tablets on day 9,10 Take 1 tablet on day 11,12 Then stop.

## 2016-12-01 NOTE — Telephone Encounter (Signed)
DK gave pt a ZPak on 1-9/18 and she states she is still sick with a fever now and prod cough per message below. She has also been to UC since the [place]. Please advise.

## 2016-12-01 NOTE — Telephone Encounter (Signed)
Pt informed prednisone sent and that if this doesn't make it better she will need an appt. Nothing further needed.

## 2016-12-09 DIAGNOSIS — B9689 Other specified bacterial agents as the cause of diseases classified elsewhere: Secondary | ICD-10-CM | POA: Diagnosis not present

## 2016-12-09 DIAGNOSIS — E1165 Type 2 diabetes mellitus with hyperglycemia: Secondary | ICD-10-CM | POA: Diagnosis not present

## 2016-12-09 DIAGNOSIS — Z880 Allergy status to penicillin: Secondary | ICD-10-CM | POA: Diagnosis not present

## 2016-12-09 DIAGNOSIS — Z885 Allergy status to narcotic agent status: Secondary | ICD-10-CM | POA: Diagnosis not present

## 2016-12-09 DIAGNOSIS — Z7951 Long term (current) use of inhaled steroids: Secondary | ICD-10-CM | POA: Diagnosis not present

## 2016-12-09 DIAGNOSIS — R7309 Other abnormal glucose: Secondary | ICD-10-CM | POA: Diagnosis not present

## 2016-12-09 DIAGNOSIS — E139 Other specified diabetes mellitus without complications: Secondary | ICD-10-CM | POA: Diagnosis not present

## 2016-12-09 DIAGNOSIS — J208 Acute bronchitis due to other specified organisms: Secondary | ICD-10-CM | POA: Diagnosis not present

## 2016-12-09 DIAGNOSIS — J45909 Unspecified asthma, uncomplicated: Secondary | ICD-10-CM | POA: Diagnosis not present

## 2016-12-09 DIAGNOSIS — R358 Other polyuria: Secondary | ICD-10-CM | POA: Diagnosis not present

## 2016-12-09 DIAGNOSIS — Z0289 Encounter for other administrative examinations: Secondary | ICD-10-CM | POA: Diagnosis not present

## 2016-12-09 DIAGNOSIS — Z7984 Long term (current) use of oral hypoglycemic drugs: Secondary | ICD-10-CM | POA: Diagnosis not present

## 2016-12-16 DIAGNOSIS — Z1211 Encounter for screening for malignant neoplasm of colon: Secondary | ICD-10-CM | POA: Diagnosis not present

## 2016-12-16 DIAGNOSIS — D0439 Carcinoma in situ of skin of other parts of face: Secondary | ICD-10-CM | POA: Diagnosis not present

## 2016-12-16 DIAGNOSIS — D2272 Melanocytic nevi of left lower limb, including hip: Secondary | ICD-10-CM | POA: Diagnosis not present

## 2016-12-16 DIAGNOSIS — Z0000 Encounter for general adult medical examination without abnormal findings: Secondary | ICD-10-CM | POA: Diagnosis not present

## 2016-12-16 DIAGNOSIS — E139 Other specified diabetes mellitus without complications: Secondary | ICD-10-CM | POA: Diagnosis not present

## 2016-12-16 DIAGNOSIS — Z85828 Personal history of other malignant neoplasm of skin: Secondary | ICD-10-CM | POA: Diagnosis not present

## 2016-12-16 DIAGNOSIS — D485 Neoplasm of uncertain behavior of skin: Secondary | ICD-10-CM | POA: Diagnosis not present

## 2016-12-16 DIAGNOSIS — D225 Melanocytic nevi of trunk: Secondary | ICD-10-CM | POA: Diagnosis not present

## 2016-12-16 DIAGNOSIS — Z1329 Encounter for screening for other suspected endocrine disorder: Secondary | ICD-10-CM | POA: Diagnosis not present

## 2016-12-16 DIAGNOSIS — E784 Other hyperlipidemia: Secondary | ICD-10-CM | POA: Diagnosis not present

## 2016-12-16 DIAGNOSIS — D2262 Melanocytic nevi of left upper limb, including shoulder: Secondary | ICD-10-CM | POA: Diagnosis not present

## 2016-12-17 DIAGNOSIS — K121 Other forms of stomatitis: Secondary | ICD-10-CM | POA: Diagnosis not present

## 2016-12-17 DIAGNOSIS — R05 Cough: Secondary | ICD-10-CM | POA: Diagnosis not present

## 2016-12-18 ENCOUNTER — Other Ambulatory Visit
Admission: RE | Admit: 2016-12-18 | Discharge: 2016-12-18 | Disposition: A | Discharge status: Home / Self Care | Payer: Medicare Other | Source: Ambulatory Visit | Attending: Unknown Physician Specialty | Admitting: Unknown Physician Specialty

## 2016-12-18 DIAGNOSIS — J209 Acute bronchitis, unspecified: Secondary | ICD-10-CM | POA: Diagnosis not present

## 2016-12-18 LAB — EXPECTORATED SPUTUM ASSESSMENT W GRAM STAIN, RFLX TO RESP C

## 2016-12-18 LAB — EXPECTORATED SPUTUM ASSESSMENT W REFEX TO RESP CULTURE

## 2017-01-13 DIAGNOSIS — E139 Other specified diabetes mellitus without complications: Secondary | ICD-10-CM | POA: Diagnosis not present

## 2017-01-14 DIAGNOSIS — E139 Other specified diabetes mellitus without complications: Secondary | ICD-10-CM | POA: Insufficient documentation

## 2017-01-27 DIAGNOSIS — E113293 Type 2 diabetes mellitus with mild nonproliferative diabetic retinopathy without macular edema, bilateral: Secondary | ICD-10-CM | POA: Diagnosis not present

## 2017-02-09 DIAGNOSIS — L908 Other atrophic disorders of skin: Secondary | ICD-10-CM | POA: Diagnosis not present

## 2017-02-09 DIAGNOSIS — D0439 Carcinoma in situ of skin of other parts of face: Secondary | ICD-10-CM | POA: Diagnosis not present

## 2017-02-09 DIAGNOSIS — L814 Other melanin hyperpigmentation: Secondary | ICD-10-CM | POA: Diagnosis not present

## 2017-02-09 DIAGNOSIS — L578 Other skin changes due to chronic exposure to nonionizing radiation: Secondary | ICD-10-CM | POA: Diagnosis not present

## 2017-03-03 ENCOUNTER — Ambulatory Visit: Payer: Medicare Other

## 2017-03-05 DIAGNOSIS — Z7984 Long term (current) use of oral hypoglycemic drugs: Secondary | ICD-10-CM | POA: Diagnosis not present

## 2017-03-05 DIAGNOSIS — Z1211 Encounter for screening for malignant neoplasm of colon: Secondary | ICD-10-CM | POA: Diagnosis not present

## 2017-03-05 DIAGNOSIS — I10 Essential (primary) hypertension: Secondary | ICD-10-CM | POA: Diagnosis not present

## 2017-03-05 DIAGNOSIS — Z86010 Personal history of colonic polyps: Secondary | ICD-10-CM | POA: Diagnosis not present

## 2017-03-05 DIAGNOSIS — G4730 Sleep apnea, unspecified: Secondary | ICD-10-CM | POA: Diagnosis not present

## 2017-03-05 DIAGNOSIS — Z79899 Other long term (current) drug therapy: Secondary | ICD-10-CM | POA: Diagnosis not present

## 2017-03-05 DIAGNOSIS — D123 Benign neoplasm of transverse colon: Secondary | ICD-10-CM | POA: Diagnosis not present

## 2017-03-05 DIAGNOSIS — J449 Chronic obstructive pulmonary disease, unspecified: Secondary | ICD-10-CM | POA: Diagnosis not present

## 2017-03-05 DIAGNOSIS — D125 Benign neoplasm of sigmoid colon: Secondary | ICD-10-CM | POA: Diagnosis not present

## 2017-03-05 DIAGNOSIS — E119 Type 2 diabetes mellitus without complications: Secondary | ICD-10-CM | POA: Diagnosis not present

## 2017-03-05 DIAGNOSIS — D126 Benign neoplasm of colon, unspecified: Secondary | ICD-10-CM | POA: Diagnosis not present

## 2017-03-05 DIAGNOSIS — K5730 Diverticulosis of large intestine without perforation or abscess without bleeding: Secondary | ICD-10-CM | POA: Diagnosis not present

## 2017-03-05 DIAGNOSIS — D124 Benign neoplasm of descending colon: Secondary | ICD-10-CM | POA: Diagnosis not present

## 2017-03-11 DIAGNOSIS — Z7984 Long term (current) use of oral hypoglycemic drugs: Secondary | ICD-10-CM | POA: Diagnosis not present

## 2017-03-11 DIAGNOSIS — R0602 Shortness of breath: Secondary | ICD-10-CM | POA: Diagnosis not present

## 2017-03-11 DIAGNOSIS — K219 Gastro-esophageal reflux disease without esophagitis: Secondary | ICD-10-CM | POA: Diagnosis not present

## 2017-03-11 DIAGNOSIS — E119 Type 2 diabetes mellitus without complications: Secondary | ICD-10-CM | POA: Diagnosis not present

## 2017-03-11 DIAGNOSIS — R05 Cough: Secondary | ICD-10-CM | POA: Diagnosis not present

## 2017-03-11 DIAGNOSIS — Z885 Allergy status to narcotic agent status: Secondary | ICD-10-CM | POA: Diagnosis not present

## 2017-03-11 DIAGNOSIS — E669 Obesity, unspecified: Secondary | ICD-10-CM | POA: Diagnosis not present

## 2017-03-11 DIAGNOSIS — Z87442 Personal history of urinary calculi: Secondary | ICD-10-CM | POA: Diagnosis not present

## 2017-03-11 DIAGNOSIS — J449 Chronic obstructive pulmonary disease, unspecified: Secondary | ICD-10-CM | POA: Diagnosis not present

## 2017-03-11 DIAGNOSIS — M7989 Other specified soft tissue disorders: Secondary | ICD-10-CM | POA: Diagnosis not present

## 2017-03-11 DIAGNOSIS — Z7951 Long term (current) use of inhaled steroids: Secondary | ICD-10-CM | POA: Diagnosis not present

## 2017-03-11 DIAGNOSIS — J45909 Unspecified asthma, uncomplicated: Secondary | ICD-10-CM | POA: Diagnosis not present

## 2017-03-11 DIAGNOSIS — E079 Disorder of thyroid, unspecified: Secondary | ICD-10-CM | POA: Diagnosis not present

## 2017-03-11 DIAGNOSIS — R600 Localized edema: Secondary | ICD-10-CM | POA: Diagnosis not present

## 2017-03-11 DIAGNOSIS — M1990 Unspecified osteoarthritis, unspecified site: Secondary | ICD-10-CM | POA: Diagnosis not present

## 2017-03-11 DIAGNOSIS — Z880 Allergy status to penicillin: Secondary | ICD-10-CM | POA: Diagnosis not present

## 2017-03-11 DIAGNOSIS — I10 Essential (primary) hypertension: Secondary | ICD-10-CM | POA: Diagnosis not present

## 2017-03-11 DIAGNOSIS — Z79899 Other long term (current) drug therapy: Secondary | ICD-10-CM | POA: Diagnosis not present

## 2017-03-11 DIAGNOSIS — Z7722 Contact with and (suspected) exposure to environmental tobacco smoke (acute) (chronic): Secondary | ICD-10-CM | POA: Diagnosis not present

## 2017-03-11 DIAGNOSIS — Z882 Allergy status to sulfonamides status: Secondary | ICD-10-CM | POA: Diagnosis not present

## 2017-03-11 DIAGNOSIS — Z886 Allergy status to analgesic agent status: Secondary | ICD-10-CM | POA: Diagnosis not present

## 2017-03-11 DIAGNOSIS — Z8249 Family history of ischemic heart disease and other diseases of the circulatory system: Secondary | ICD-10-CM | POA: Diagnosis not present

## 2017-03-11 DIAGNOSIS — Z881 Allergy status to other antibiotic agents status: Secondary | ICD-10-CM | POA: Diagnosis not present

## 2017-03-12 ENCOUNTER — Other Ambulatory Visit: Payer: Self-pay | Admitting: *Deleted

## 2017-03-12 NOTE — Progress Notes (Signed)
[facility]     Assessment and Plan:  The patient is a 76 year old female with history of asthma, OSA.  Asthma with acute exacerbation.  --She is currently doing better, she seems to have improved with steroids.  --Continue advair, use albuterol as needed.  --Will give script for albuterol and nebulizer.   Obstructive sleep apnea. --Encouraged to wear her CPAP at night, she is currently not doing CPAP as she falls asleep in her recliner. Asked that she place her CPAP by the recliner.   Date: 03/12/2017  MRN# [id number] Darlene Robbins 06/17/1941    Cache Decoursey is a 76 y.o. old female seen in consultation for chief complaint of:    Chief Complaint  Patient presents with  . Hospitalization Follow-up  . Shortness of Breath    HPI:   The patient is a 77 year old female with history of asthma, OSA, GERD.She was recently seen in the Hospital in [place], status post colonoscopy. She last saw Dr. Mortimer Fries, who she normally sees at our office. However currently she had a colonoscopy on 5/1, and the next day she started having some dyspnea which did not respond to her rescue inhaler. She then went to the ED, she got a CXR which was apparently normal. It was noted that her feet were swollen. She was given steroid and then noted that she felt better.  She completed the steroids yesterday and she is feeling better.  She is on advair 250 twice daily and rinses her mouth out.   She has a CPAP but falls asleep in her recliner and does not use it.   Imaging personally reviewed some: Chest x-ray 10/26/15; mild hyperinflation, otherwise unremarkable.  Medication:   Reviewed   Allergies:  Aspirin; Cephalexin; Codeine; Penicillins; and Sulfa antibiotics  Review of Systems: Gen:  Denies  fever, sweats, chills HEENT: Denies blurred vision, double vision. bleeds, sore throat Cvc:  No dizziness, chest pain. Resp:   Denies cough or sputum production, shortness  of breath Gi: Denies swallowing difficulty, stomach pain. Gu:  Denies bladder incontinence, burning urine Ext:   No Joint pain, stiffness. Skin: No skin rash,  hives  Endoc:  No polyuria, polydipsia. Psych: No depression, insomnia. Other:  All other systems were reviewed with the patient and were negative other that what is mentioned in the HPI.   Physical Examination:   VS: BP (!) 152/70 (BP Location: Left Arm, Patient Position: Sitting, Cuff Size: Normal)   Pulse 77   Resp 16   Ht 5\' 7"  (1.702 m)   Wt 262 lb (118.8 kg)   SpO2 98%   BMI 41.04 kg/m   General Appearance: No distress. Obese.  Neuro:without focal findings,  speech normal,  HEENT: PERRLA, EOM intact.   Pulmonary: normal breath sounds, No wheezing.  CardiovascularNormal S1,S2.  No m/r/g.   Abdomen: Benign, Soft, non-tender. Renal:  No costovertebral tenderness  GU:  No performed at this time. Endoc: No evident thyromegaly, no signs of acromegaly. Skin:   warm, no rashes, no ecchymosis  Extremities: normal, no cyanosis, clubbing.  Other findings:    LABORATORY PANEL:   CBC No results for input(s): WBC, HGB, HCT, PLT in the last 168 hours. ------------------------------------------------------------------------------------------------------------------  Chemistries  No results for input(s): NA, K, CL, CO2, GLUCOSE, BUN, CREATININE, CALCIUM, MG, AST, ALT, ALKPHOS, BILITOT in the last 168 hours.  Invalid input(s): GFRCGP ------------------------------------------------------------------------------------------------------------------  Cardiac Enzymes No results for input(s): TROPONINI in the last 168 hours. ------------------------------------------------------------  RADIOLOGY:  No  results found.     Thank  you for the consultation and for allowing [facility] to assist in the care of your patient. Our recommendations are noted above.  Please contact us if we can be of further  service.   Marda Stalker, MD.  Board Certified in Internal Medicine, Pulmonary Medicine, [place], and Sleep Medicine.  [facility] Office Number: [phone]  Patricia Pesa, M.D.  Merton Border, M.D  03/12/2017

## 2017-03-13 DIAGNOSIS — E139 Other specified diabetes mellitus without complications: Secondary | ICD-10-CM | POA: Diagnosis not present

## 2017-03-13 DIAGNOSIS — I10 Essential (primary) hypertension: Secondary | ICD-10-CM | POA: Diagnosis not present

## 2017-03-13 DIAGNOSIS — J4541 Moderate persistent asthma with (acute) exacerbation: Secondary | ICD-10-CM | POA: Diagnosis not present

## 2017-03-16 ENCOUNTER — Ambulatory Visit (INDEPENDENT_AMBULATORY_CARE_PROVIDER_SITE_OTHER): Payer: Medicare Other | Admitting: Internal Medicine

## 2017-03-16 ENCOUNTER — Encounter: Payer: Self-pay | Admitting: Internal Medicine

## 2017-03-16 VITALS — BP 152/70 | HR 77 | Resp 16 | Ht 67.0 in | Wt 262.0 lb

## 2017-03-16 DIAGNOSIS — J42 Unspecified chronic bronchitis: Secondary | ICD-10-CM

## 2017-03-16 DIAGNOSIS — J441 Chronic obstructive pulmonary disease with (acute) exacerbation: Secondary | ICD-10-CM | POA: Diagnosis not present

## 2017-03-16 MED ORDER — ALBUTEROL SULFATE (2.5 MG/3ML) 0.083% IN NEBU: 2.5000 mg | INHALATION_SOLUTION | Freq: Four times a day (QID) | RESPIRATORY_TRACT | 12 refills | Status: DC | PRN

## 2017-03-16 NOTE — Patient Instructions (Addendum)
--  Continue advair.   --Will check PFT.   --Follow up with Dr. Mortimer Fries in 3 months.   --You need to start using your CPAP every night!!

## 2017-03-17 ENCOUNTER — Telehealth: Payer: Self-pay | Admitting: Internal Medicine

## 2017-03-17 NOTE — Telephone Encounter (Signed)
[facility] and spoke with the pharmacy. Gave pharmacy J44.9 code for COPD. She states once pt receives her nebulizer machine and they put in that date that the RX should go through without any issues. Will call pt to inform.  Spoke with pt and she states she will be picking up nebulizer machine today after lunch. Will call [facility] back to inform them.  Called [facility] back to inform them pt is picking up neb machine today from [facility]. Pharmacy ran med through and it will be ready for pick up when pt arrives. Nothing further needed.

## 2017-03-17 NOTE — Telephone Encounter (Signed)
Per patient Dr. Juanell Fairly rx'd a new nebulizer that insurance will not cover because of coding.  Please call to discuss.

## 2017-03-19 DIAGNOSIS — R0689 Other abnormalities of breathing: Secondary | ICD-10-CM | POA: Diagnosis not present

## 2017-03-19 DIAGNOSIS — G4733 Obstructive sleep apnea (adult) (pediatric): Secondary | ICD-10-CM | POA: Diagnosis not present

## 2017-03-19 DIAGNOSIS — Z6841 Body Mass Index (BMI) 40.0 and over, adult: Secondary | ICD-10-CM | POA: Diagnosis not present

## 2017-03-19 DIAGNOSIS — I10 Essential (primary) hypertension: Secondary | ICD-10-CM | POA: Diagnosis not present

## 2017-03-19 DIAGNOSIS — R0600 Dyspnea, unspecified: Secondary | ICD-10-CM | POA: Diagnosis not present

## 2017-03-19 DIAGNOSIS — I38 Endocarditis, valve unspecified: Secondary | ICD-10-CM | POA: Diagnosis not present

## 2017-03-19 DIAGNOSIS — E782 Mixed hyperlipidemia: Secondary | ICD-10-CM | POA: Diagnosis not present

## 2017-04-14 DIAGNOSIS — Z85828 Personal history of other malignant neoplasm of skin: Secondary | ICD-10-CM | POA: Diagnosis not present

## 2017-04-15 DIAGNOSIS — R600 Localized edema: Secondary | ICD-10-CM | POA: Diagnosis not present

## 2017-04-15 DIAGNOSIS — R0602 Shortness of breath: Secondary | ICD-10-CM | POA: Diagnosis not present

## 2017-04-15 DIAGNOSIS — I10 Essential (primary) hypertension: Secondary | ICD-10-CM | POA: Diagnosis not present

## 2017-05-01 DIAGNOSIS — K219 Gastro-esophageal reflux disease without esophagitis: Secondary | ICD-10-CM | POA: Diagnosis not present

## 2017-05-01 DIAGNOSIS — G4730 Sleep apnea, unspecified: Secondary | ICD-10-CM | POA: Diagnosis not present

## 2017-05-01 DIAGNOSIS — E669 Obesity, unspecified: Secondary | ICD-10-CM | POA: Diagnosis not present

## 2017-05-01 DIAGNOSIS — J449 Chronic obstructive pulmonary disease, unspecified: Secondary | ICD-10-CM | POA: Diagnosis not present

## 2017-05-01 DIAGNOSIS — E139 Other specified diabetes mellitus without complications: Secondary | ICD-10-CM | POA: Diagnosis not present

## 2017-05-01 DIAGNOSIS — E785 Hyperlipidemia, unspecified: Secondary | ICD-10-CM | POA: Diagnosis not present

## 2017-05-01 DIAGNOSIS — N3281 Overactive bladder: Secondary | ICD-10-CM | POA: Diagnosis not present

## 2017-05-01 DIAGNOSIS — I10 Essential (primary) hypertension: Secondary | ICD-10-CM | POA: Diagnosis not present

## 2017-05-26 ENCOUNTER — Ambulatory Visit: Payer: Medicare Other | Attending: Internal Medicine

## 2017-05-26 DIAGNOSIS — J40 Bronchitis, not specified as acute or chronic: Secondary | ICD-10-CM | POA: Diagnosis not present

## 2017-05-26 DIAGNOSIS — J449 Chronic obstructive pulmonary disease, unspecified: Secondary | ICD-10-CM | POA: Insufficient documentation

## 2017-05-26 DIAGNOSIS — J441 Chronic obstructive pulmonary disease with (acute) exacerbation: Secondary | ICD-10-CM

## 2017-05-26 DIAGNOSIS — J42 Unspecified chronic bronchitis: Secondary | ICD-10-CM

## 2017-06-12 ENCOUNTER — Ambulatory Visit (INDEPENDENT_AMBULATORY_CARE_PROVIDER_SITE_OTHER): Payer: Medicare Other | Admitting: Internal Medicine

## 2017-06-12 ENCOUNTER — Encounter: Payer: Self-pay | Admitting: Internal Medicine

## 2017-06-12 VITALS — BP 142/68 | HR 77 | Resp 16 | Ht 67.0 in | Wt 251.0 lb

## 2017-06-12 DIAGNOSIS — J449 Chronic obstructive pulmonary disease, unspecified: Secondary | ICD-10-CM

## 2017-06-12 NOTE — Progress Notes (Signed)
[facility] Consultation     Date: 06/12/2017,   MRN# [id number] Arline Ketter 21-Jan-1941 Code Status:  Hosp day:_0 @ Referring MD: _1 @     PCP:      AdmissionWeight: 251 lb (113.9 kg)                 CurrentWeight: 251 lb (113.9 kg) Norine Reddington is a 76 y.o. old female seen in consultation for follow up SOB and acute bronchitis. PFT's1/2015 Ratio 66%, FEV1 70% PFT 12/2015 Ratio 79% FEv1 78%  SYNOPSIS: moderate COPD from second hand smoke  CC: follow up SOB,wheezing HPI Follow up COPD today Patient had previous COPD exacerbation and responded well to steroids and antibiotics Patient is doing okay with her COPD Has noticed increased activity Is compliant with Advair Last albuterol use was several weeks ago Patient has no signs of infection at this time No signs of acute heart failure at this time  Patient has chronic shortness of breath and dyspnea on exertion  Signs of acute exacerbation at this time   HHP HPI H  Current Outpatient Prescriptions:  .  ACCU-CHEK FASTCLIX LANCETS MISC, 1 kit by Does not apply route 2 (two) times daily., Disp: 102 each, Rfl: 12 .  albuterol (PROVENTIL) (2.5 MG/3ML) 0.083% nebulizer solution, Take 3 mLs (2.5 mg total) by nebulization every 6 (six) hours as needed for wheezing or shortness of breath., Disp: 75 mL, Rfl: 12 .  albuterol (VENTOLIN HFA) 108 (90 BASE) MCG/ACT inhaler, Inhale 2 puffs into the lungs daily. (Patient taking differently: Inhale 2 puffs into the lungs every 4 (four) hours as needed. ), Disp: 36 g, Rfl: 12 .  benzonatate (TESSALON) 100 MG capsule, Take 1 capsule (100 mg total) by mouth every 8 (eight) hours., Disp: 30 capsule, Rfl: 0 .  Blood Glucose Calibration (ACCU-CHEK AVIVA) SOLN, 1 each by In Vitro route once., Disp: 1 each, Rfl: 2 .  Blood Glucose Monitoring Suppl (ONE TOUCH ULTRA SYSTEM KIT) W/DEVICE KIT, 1 kit by Does not apply route once. Use as directed. Test  blood sugars 1-2 times daily., Disp: 1 each, Rfl: 0 .  cetirizine (ZYRTEC ALLERGY) 10 MG tablet, Take 1 tablet (10 mg total) by mouth daily., Disp: 90 tablet, Rfl: 12 .  diclofenac sodium (VOLTAREN) 1 % GEL, Apply 1 application topically 2 (two) times daily as needed.  , Disp: , Rfl:  .  DOCOSAHEXAENOIC ACID PO, Take 2 tablets by mouth daily., Disp: , Rfl:  .  fluorouracil (EFUDEX) 5 % cream, APP AA BID FOR 4 WEEKS, Disp: , Rfl: 0 .  Fluticasone-Salmeterol (ADVAIR DISKUS) 250-50 MCG/DOSE AEPB, Inhale 1 puff into the lungs 2 (two) times daily., Disp: , Rfl:  .  furosemide (LASIX) 20 MG tablet, Take 1 tablet (20 mg total) by mouth daily as needed., Disp: 90 tablet, Rfl: 4 .  gentamicin ointment (GARAMYCIN) 0.1 %, Apply 1 application topically 3 (three) times daily. (Patient taking differently: Apply 1 application topically 3 (three) times daily as needed. ), Disp: 30 g, Rfl: 3 .  glipiZIDE (GLUCOTROL XL) 5 MG 24 hr tablet, Take 5 mg by mouth daily., Disp: , Rfl:  .  glucose blood test strip, 1 each by Other route as needed for other. Use as instructed with One Touch Ultra Glucometer. Test blood sugars at home 1-2 times daily, Disp: 100 each, Rfl: 3 .  hydrocortisone 2.5 % cream, APPLY TO THE AFFECTED ON FACE BID PRN., Disp: , Rfl: 4 .  levothyroxine (  SYNTHROID, LEVOTHROID) 75 MCG tablet, Take 1 tablet (75 mcg total) by mouth daily., Disp: 90 tablet, Rfl: 3 .  losartan (COZAAR) 25 MG tablet, Take 25 mg by mouth daily., Disp: , Rfl:  .  nystatin (MYCOSTATIN) 100000 UNIT/ML suspension, , Disp: , Rfl:  .  nystatin (MYCOSTATIN/NYSTOP) 100000 UNIT/GM POWD, Apply twice daily as needed, Disp: 60 g, Rfl: 3 .  ondansetron (ZOFRAN) 8 MG tablet, Take 1 tablet (8 mg total) by mouth every 8 (eight) hours as needed for nausea or vomiting., Disp: 30 tablet, Rfl: 3 .  ADVAIR DISKUS 250-50 MCG/DOSE AEPB, Inhale 1 puff into the lungs 2 (two) times daily., Disp: 1 each, Rfl: 10 .  metFORMIN (GLUCOPHAGE) 500 MG tablet,  Take 1 tablet (500 mg total) by mouth 2 (two) times daily with a meal., Disp: 180 tablet, Rfl: 3 .  pantoprazole (PROTONIX) 40 MG tablet, Take 1 tablet (40 mg total) by mouth 2 (two) times daily., Disp: 180 tablet, Rfl: 3     ALLERGIES   Aspirin; Cephalexin; Codeine; Penicillins; and Sulfa antibiotics     REVIEW OF SYSTEMS   Review of Systems  Constitutional: Negative for chills, fever, malaise/fatigue and weight loss.  HENT: Negative for congestion.   Respiratory: Positive for shortness of breath and wheezing. Negative for cough, hemoptysis and sputum production.   Cardiovascular: Negative for leg swelling.  Gastrointestinal: Negative.   Neurological: Negative.   All other systems reviewed and are negative.    VS: BP (!) 142/68 (BP Location: Left Arm, Cuff Size: Large)   Pulse 77   Resp 16   Ht _0  (1.702 m)   Wt 251 lb (113.9 kg)   SpO2 99%   BMI 39.31 kg/m      PHYSICAL EXAM   Physical Exam  Constitutional: She is oriented to person, place, and time. No distress.  HENT:  Head: Normocephalic and atraumatic.  Cardiovascular: Normal rate, regular rhythm and normal heart sounds.   No murmur heard. Pulmonary/Chest: No respiratory distress. She has no wheezes.  Musculoskeletal: Normal range of motion. She exhibits no edema.  Neurological: She is alert and oriented to person, place, and time.  Skin: She is not diaphoretic.  Psychiatric: She has a normal mood and affect.        ASSESSMENT/PLAN   76 yo obese white female seen today for follow up for Moderate COPD Grade B, In the setting of sleep apnea along with morbid obesity and deconditioned state  #1 shortness of breath and dyspnea related to her COPD as well as her obesity and deconditioned state Continue therapy for her COPD  #2 COPD moderate Gold stage B Continue Advair as prescribed Continue albuterol as needed Avoid secondhand smoke  #3 Obesity -recommend significant weight loss -recommend  changing diet  #4 Deconditioned state -Recommend increased daily activity and exercise   #5 patient with underlying sleep apnea on CPAP therapy Will need to download compliance report and assess patient's AHI   Patient satisfied with Plan of action and management. All questions answered Follow-up in 6 months  Eliot Popper Patricia Pesa, M.D.  [facility]  Medical Director [facility] Director [facility] Cardio-Pulmonary Department

## 2017-06-12 NOTE — Patient Instructions (Signed)
Continue Advair as prescribed Albuterol as needed continue CPAP therapy as prescribed for OSA

## 2017-06-18 ENCOUNTER — Other Ambulatory Visit: Payer: Self-pay

## 2017-06-18 NOTE — Telephone Encounter (Signed)
Last office visit 01/30/16 Dr Gilford Rile to follow up 3 months No office visit scheduled

## 2017-06-22 NOTE — Telephone Encounter (Signed)
Spoke with patient she has established with new PCP

## 2017-07-01 IMAGING — CR DG CHEST 2V
2 series · 2 of 2 positions shown · non-contrast
Comparison: 06/18/2014

CLINICAL DATA: Cough and wheezing for 6 days.

EXAM:
CHEST  2 VIEW

[chest pa]
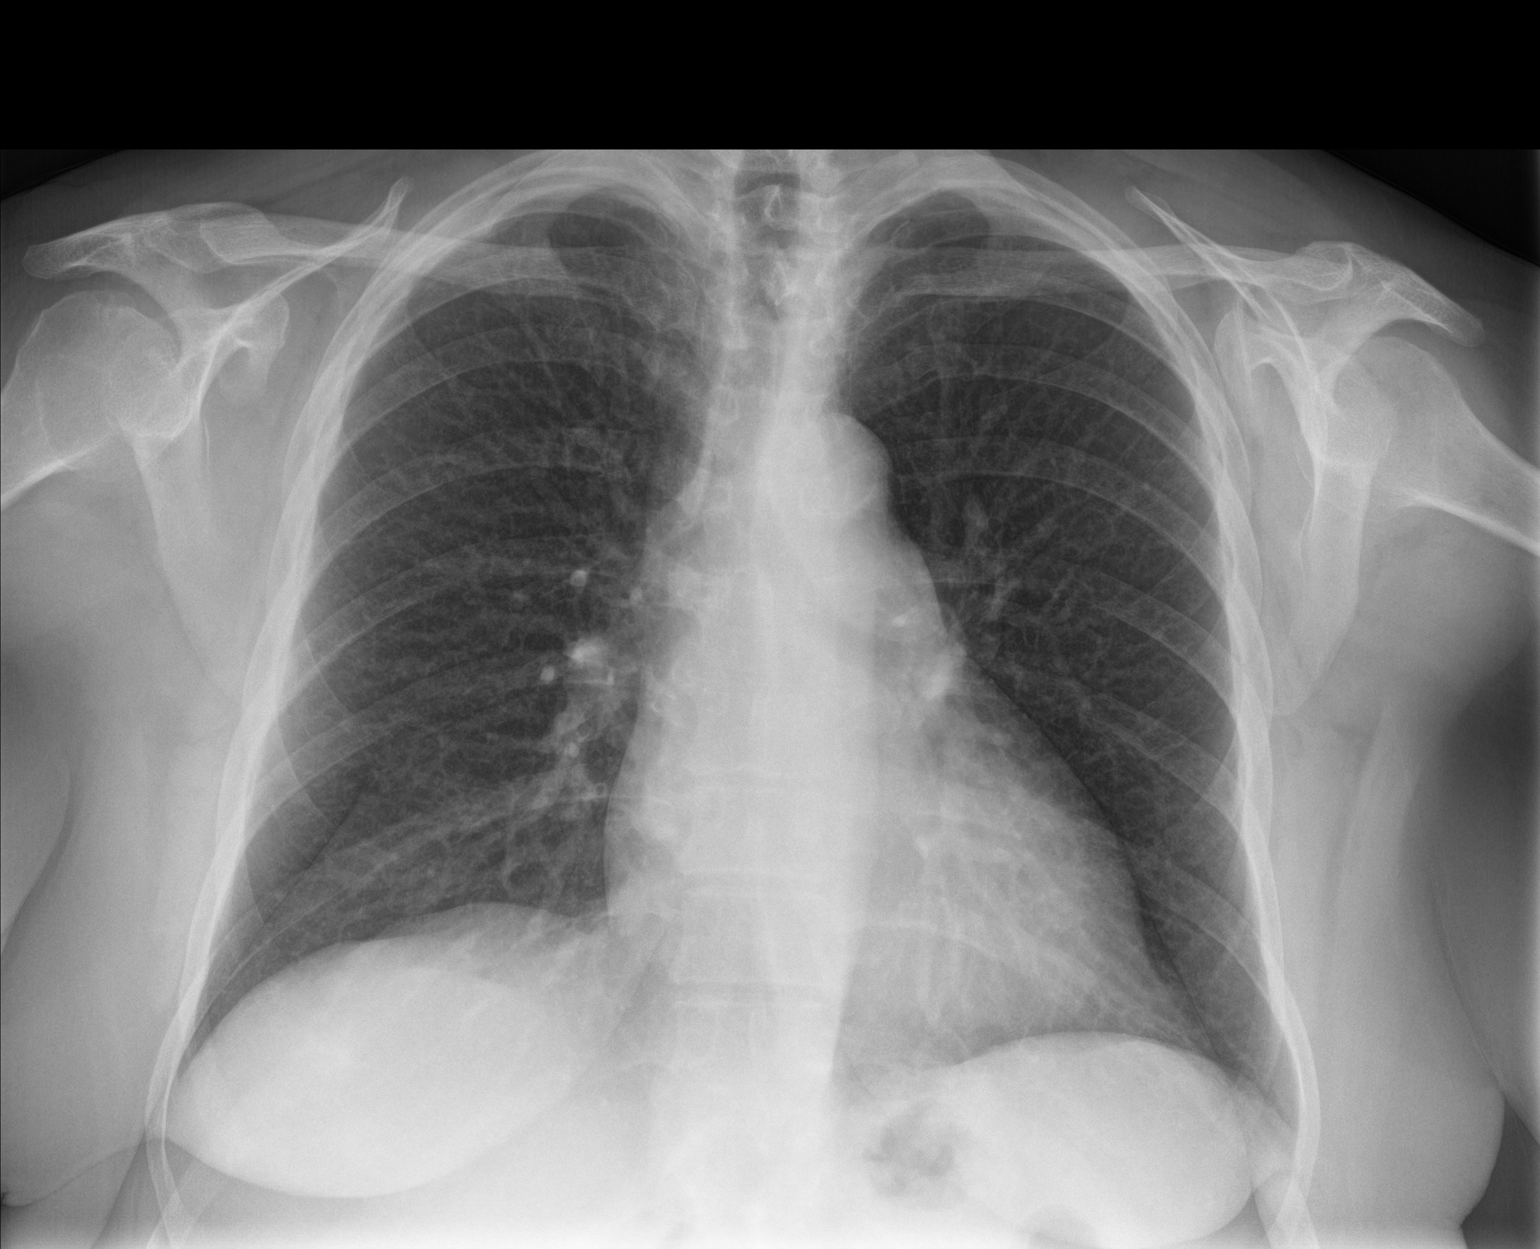

[chest lat]
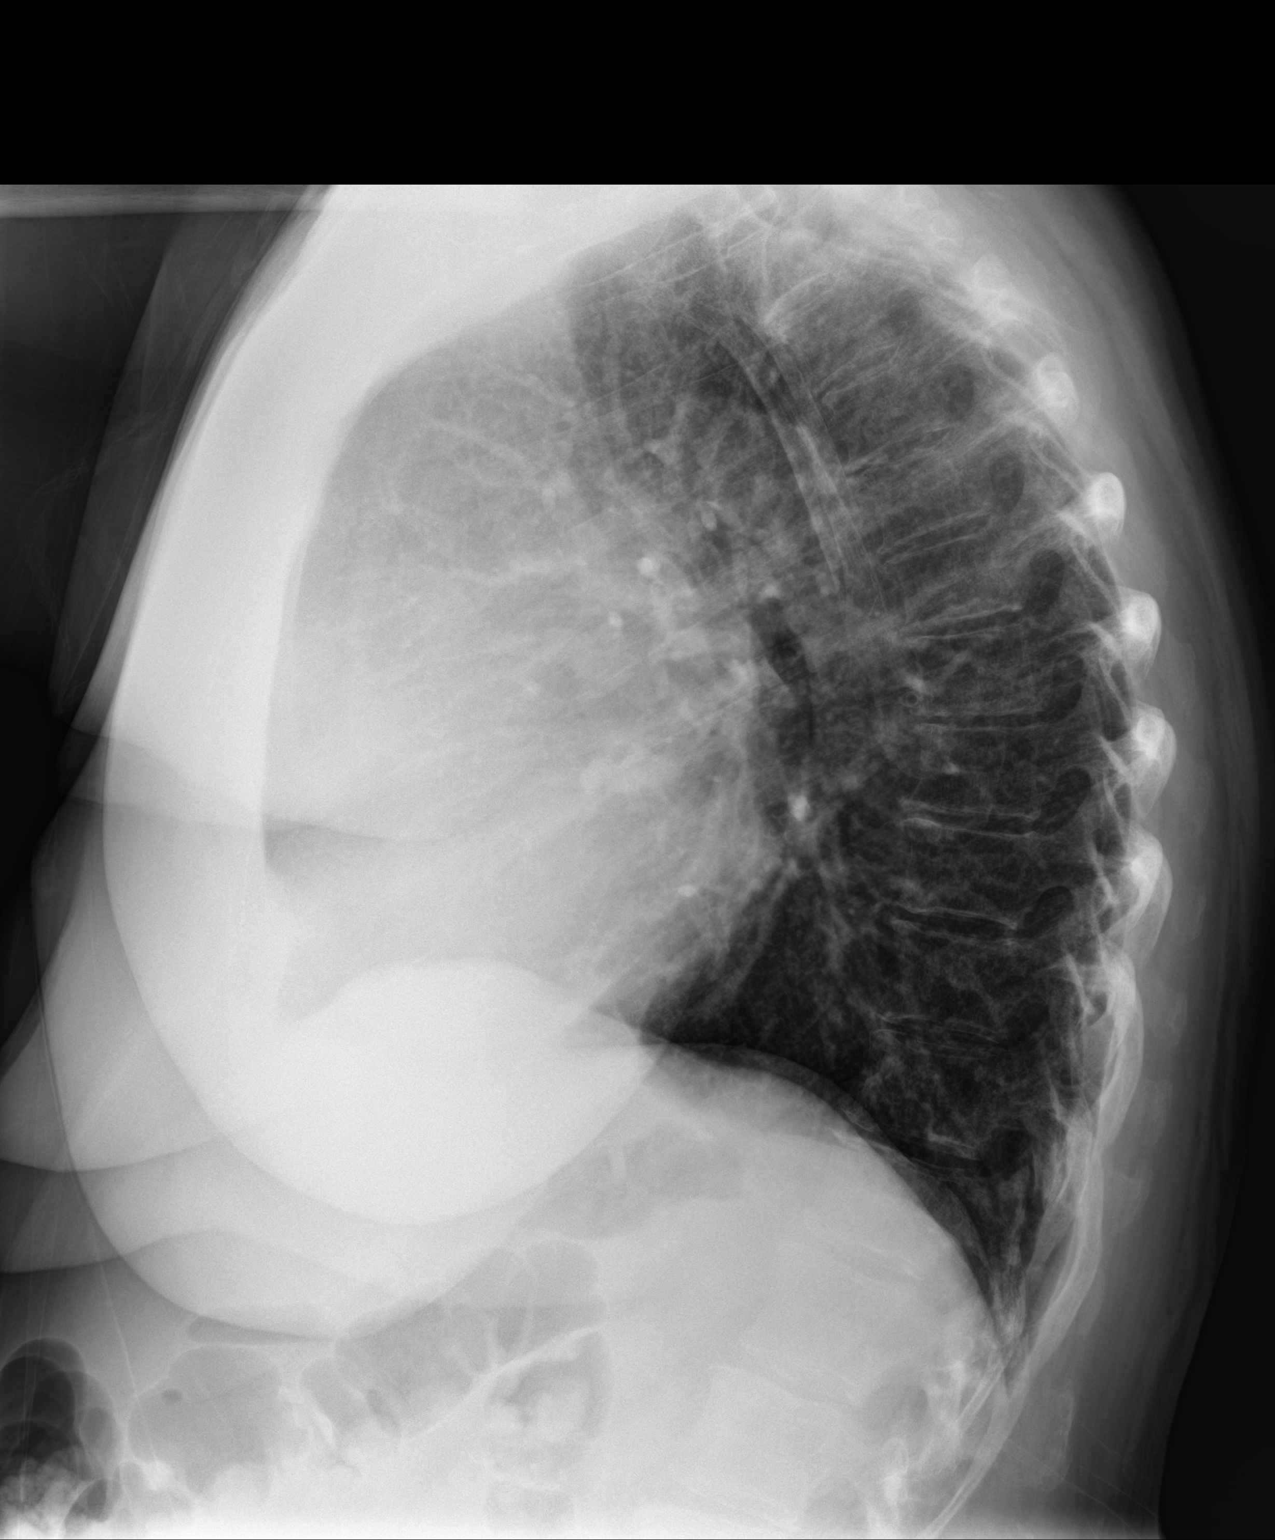

[2 of 2 positions shown; findings below may reference images not displayed]

FINDINGS: Cardiomediastinal contours are unchanged with mild cardiomegaly.
Chronic interstitial prominence is unchanged. Pulmonary vasculature
is normal. No consolidation, pleural effusion, or pneumothorax. No
acute osseous abnormalities are seen.
IMPRESSION: Stable mild cardiomegaly and interstitial prominence. No
superimposed acute process.

## 2017-07-20 DIAGNOSIS — Z08 Encounter for follow-up examination after completed treatment for malignant neoplasm: Secondary | ICD-10-CM | POA: Diagnosis not present

## 2017-07-20 DIAGNOSIS — D045 Carcinoma in situ of skin of trunk: Secondary | ICD-10-CM | POA: Diagnosis not present

## 2017-07-20 DIAGNOSIS — Z85828 Personal history of other malignant neoplasm of skin: Secondary | ICD-10-CM | POA: Diagnosis not present

## 2017-07-20 DIAGNOSIS — L570 Actinic keratosis: Secondary | ICD-10-CM | POA: Diagnosis not present

## 2017-07-20 DIAGNOSIS — D485 Neoplasm of uncertain behavior of skin: Secondary | ICD-10-CM | POA: Diagnosis not present

## 2017-07-20 DIAGNOSIS — X32XXXA Exposure to sunlight, initial encounter: Secondary | ICD-10-CM | POA: Diagnosis not present

## 2017-07-20 DIAGNOSIS — L821 Other seborrheic keratosis: Secondary | ICD-10-CM | POA: Diagnosis not present

## 2017-07-28 DIAGNOSIS — G4733 Obstructive sleep apnea (adult) (pediatric): Secondary | ICD-10-CM | POA: Diagnosis not present

## 2017-08-03 DIAGNOSIS — D045 Carcinoma in situ of skin of trunk: Secondary | ICD-10-CM | POA: Diagnosis not present

## 2017-09-01 DIAGNOSIS — Z23 Encounter for immunization: Secondary | ICD-10-CM | POA: Diagnosis not present

## 2017-09-15 ENCOUNTER — Other Ambulatory Visit: Payer: Self-pay | Admitting: Specialist

## 2017-09-15 DIAGNOSIS — Z1231 Encounter for screening mammogram for malignant neoplasm of breast: Secondary | ICD-10-CM

## 2017-10-12 DIAGNOSIS — Z6841 Body Mass Index (BMI) 40.0 and over, adult: Secondary | ICD-10-CM | POA: Diagnosis not present

## 2017-10-12 DIAGNOSIS — E119 Type 2 diabetes mellitus without complications: Secondary | ICD-10-CM | POA: Diagnosis not present

## 2017-10-12 DIAGNOSIS — E139 Other specified diabetes mellitus without complications: Secondary | ICD-10-CM | POA: Diagnosis not present

## 2017-10-12 DIAGNOSIS — R600 Localized edema: Secondary | ICD-10-CM | POA: Diagnosis not present

## 2017-10-12 DIAGNOSIS — E039 Hypothyroidism, unspecified: Secondary | ICD-10-CM | POA: Diagnosis not present

## 2017-10-12 DIAGNOSIS — K219 Gastro-esophageal reflux disease without esophagitis: Secondary | ICD-10-CM | POA: Diagnosis not present

## 2017-10-12 DIAGNOSIS — I10 Essential (primary) hypertension: Secondary | ICD-10-CM | POA: Diagnosis not present

## 2017-10-12 DIAGNOSIS — I38 Endocarditis, valve unspecified: Secondary | ICD-10-CM | POA: Diagnosis not present

## 2017-10-12 DIAGNOSIS — E782 Mixed hyperlipidemia: Secondary | ICD-10-CM | POA: Diagnosis not present

## 2017-10-12 DIAGNOSIS — R0602 Shortness of breath: Secondary | ICD-10-CM | POA: Diagnosis not present

## 2017-10-12 DIAGNOSIS — G4730 Sleep apnea, unspecified: Secondary | ICD-10-CM | POA: Diagnosis not present

## 2017-10-12 DIAGNOSIS — E785 Hyperlipidemia, unspecified: Secondary | ICD-10-CM | POA: Diagnosis not present

## 2017-10-12 DIAGNOSIS — G4733 Obstructive sleep apnea (adult) (pediatric): Secondary | ICD-10-CM | POA: Diagnosis not present

## 2017-10-12 DIAGNOSIS — J449 Chronic obstructive pulmonary disease, unspecified: Secondary | ICD-10-CM | POA: Diagnosis not present

## 2017-10-15 ENCOUNTER — Ambulatory Visit: Payer: Medicare Other

## 2017-11-09 ENCOUNTER — Ambulatory Visit
Admission: RE | Admit: 2017-11-09 | Discharge: 2017-11-09 | Disposition: A | Discharge status: Home / Self Care | Payer: Medicare Other | Source: Ambulatory Visit | Attending: Specialist | Admitting: Specialist

## 2017-11-09 DIAGNOSIS — Z1231 Encounter for screening mammogram for malignant neoplasm of breast: Secondary | ICD-10-CM | POA: Diagnosis not present

## 2017-11-09 HISTORY — DX: Malignant (primary) neoplasm, unspecified: C80.1

## 2017-12-14 DIAGNOSIS — D2261 Melanocytic nevi of right upper limb, including shoulder: Secondary | ICD-10-CM | POA: Diagnosis not present

## 2017-12-14 DIAGNOSIS — D2272 Melanocytic nevi of left lower limb, including hip: Secondary | ICD-10-CM | POA: Diagnosis not present

## 2017-12-14 DIAGNOSIS — D225 Melanocytic nevi of trunk: Secondary | ICD-10-CM | POA: Diagnosis not present

## 2017-12-14 DIAGNOSIS — X32XXXA Exposure to sunlight, initial encounter: Secondary | ICD-10-CM | POA: Diagnosis not present

## 2017-12-14 DIAGNOSIS — Z85828 Personal history of other malignant neoplasm of skin: Secondary | ICD-10-CM | POA: Diagnosis not present

## 2017-12-14 DIAGNOSIS — L570 Actinic keratosis: Secondary | ICD-10-CM | POA: Diagnosis not present

## 2017-12-17 ENCOUNTER — Ambulatory Visit: Payer: Medicare Other | Admitting: Internal Medicine

## 2018-01-08 DIAGNOSIS — E139 Other specified diabetes mellitus without complications: Secondary | ICD-10-CM | POA: Diagnosis not present

## 2018-01-08 DIAGNOSIS — B9689 Other specified bacterial agents as the cause of diseases classified elsewhere: Secondary | ICD-10-CM | POA: Diagnosis not present

## 2018-01-08 DIAGNOSIS — R062 Wheezing: Secondary | ICD-10-CM | POA: Diagnosis not present

## 2018-01-08 DIAGNOSIS — J208 Acute bronchitis due to other specified organisms: Secondary | ICD-10-CM | POA: Diagnosis not present

## 2018-01-26 ENCOUNTER — Ambulatory Visit (INDEPENDENT_AMBULATORY_CARE_PROVIDER_SITE_OTHER): Payer: Medicare Other | Admitting: Internal Medicine

## 2018-01-26 ENCOUNTER — Encounter: Payer: Self-pay | Admitting: Internal Medicine

## 2018-01-26 VITALS — BP 126/76 | HR 70 | Ht 67.0 in | Wt 260.0 lb

## 2018-01-26 DIAGNOSIS — J449 Chronic obstructive pulmonary disease, unspecified: Secondary | ICD-10-CM

## 2018-01-26 DIAGNOSIS — J309 Allergic rhinitis, unspecified: Secondary | ICD-10-CM | POA: Diagnosis not present

## 2018-01-26 MED ORDER — FLUTICASONE-SALMETEROL 250-50 MCG/DOSE IN AEPB: 1.0000 | INHALATION_SPRAY | Freq: Two times a day (BID) | RESPIRATORY_TRACT | 3 refills | Status: DC

## 2018-01-26 MED ORDER — CETIRIZINE HCL 10 MG PO TABS: 10.0000 mg | ORAL_TABLET | Freq: Every day | ORAL | 3 refills | Status: DC

## 2018-01-26 NOTE — Progress Notes (Signed)
[facility] Consultation     Date: 01/26/2018,   MRN# [id number] Darlene Robbins 13-Jan-1941      Admission                  Current  Darlene Robbins is a 77 y.o. old female seen in consultation for follow up SOB and acute bronchitis. 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 HPI Follow up COPD today Patient had previous COPD exacerbation and responded well to steroids and antibiotics Patient is doing okay with her COPD Has noticed increased activity Is compliant with Advair Last albuterol use was several weeks ago Patient has no signs of infection at this time No signs of acute heart failure at this time  Patient has chronic shortness of breath and dyspnea on exertion  NO Signs of acute exacerbation at this time   HHP  Recen PI H  Current Outpatient Medications:  .  ACCU-CHEK FASTCLIX LANCETS MISC, 1 kit by Does not apply route 2 (two) times daily., Disp: 102 each, Rfl: 12 .  ADVAIR DISKUS 250-50 MCG/DOSE AEPB, Inhale 1 puff into the lungs 2 (two) times daily., Disp: 1 each, Rfl: 10 .  albuterol (PROVENTIL) (2.5 MG/3ML) 0.083% nebulizer solution, Take 3 mLs (2.5 mg total) by nebulization every 6 (six) hours as needed for wheezing or shortness of breath., Disp: 75 mL, Rfl: 12 .  albuterol (VENTOLIN HFA) 108 (90 BASE) MCG/ACT inhaler, Inhale 2 puffs into the lungs daily. (Patient taking differently: Inhale 2 puffs into the lungs every 4 (four) hours as needed. ), Disp: 36 g, Rfl: 12 .  benzonatate (TESSALON) 100 MG capsule, Take 1 capsule (100 mg total) by mouth every 8 (eight) hours., Disp: 30 capsule, Rfl: 0 .  Blood Glucose Calibration (ACCU-CHEK AVIVA) SOLN, 1 each by In Vitro route once., Disp: 1 each, Rfl: 2 .  Blood Glucose Monitoring Suppl (ONE  TOUCH ULTRA SYSTEM KIT) W/DEVICE KIT, 1 kit by Does not apply route once. Use as directed. Test blood sugars 1-2 times daily., Disp: 1 each, Rfl: 0 .  cetirizine (ZYRTEC ALLERGY) 10 MG tablet, Take 1 tablet (10 mg total) by mouth daily., Disp: 90 tablet, Rfl: 12 .  diclofenac sodium (VOLTAREN) 1 % GEL, Apply 1 application topically 2 (two) times daily as needed.  , Disp: , Rfl:  .  DOCOSAHEXAENOIC ACID PO, Take 2 tablets by mouth daily., Disp: , Rfl:  .  fluorouracil (EFUDEX) 5 % cream, APP AA BID FOR 4 WEEKS, Disp: , Rfl: 0 .  Fluticasone-Salmeterol (ADVAIR DISKUS) 250-50 MCG/DOSE AEPB, Inhale 1 puff into the lungs 2 (two) times daily., Disp: , Rfl:  .  furosemide (LASIX) 20 MG tablet, Take 1 tablet (20 mg total) by mouth daily as needed., Disp: 90 tablet, Rfl: 4 .  gentamicin ointment (GARAMYCIN) 0.1 %, Apply 1 application topically 3 (three) times daily. (Patient taking differently: Apply 1 application topically 3 (three) times daily as needed. ), Disp: 30 g, Rfl: 3 .  glipiZIDE (GLUCOTROL XL) 5 MG 24 hr tablet, Take 5 mg by mouth daily., Disp: , Rfl:  .  glucose blood test strip, 1 each by Other route as needed for other. Use as instructed with One Touch Ultra Glucometer. Test blood sugars at home 1-2 times daily, Disp: 100 each, Rfl:  3 .  hydrocortisone 2.5 % cream, APPLY TO THE AFFECTED ON FACE BID PRN., Disp: , Rfl: 4 .  levothyroxine (SYNTHROID, LEVOTHROID) 75 MCG tablet, Take 1 tablet (75 mcg total) by mouth daily., Disp: 90 tablet, Rfl: 3 .  losartan (COZAAR) 25 MG tablet, Take 25 mg by mouth daily., Disp: , Rfl:  .  metFORMIN (GLUCOPHAGE) 500 MG tablet, Take 1 tablet (500 mg total) by mouth 2 (two) times daily with a meal., Disp: 180 tablet, Rfl: 3 .  nystatin (MYCOSTATIN) 100000 UNIT/ML suspension, , Disp: , Rfl:  .  nystatin (MYCOSTATIN/NYSTOP) 100000 UNIT/GM POWD, Apply twice daily as needed, Disp: 60 g, Rfl: 3 .  ondansetron (ZOFRAN) 8 MG tablet, Take 1 tablet (8 mg total) by mouth  every 8 (eight) hours as needed for nausea or vomiting., Disp: 30 tablet, Rfl: 3 .  pantoprazole (PROTONIX) 40 MG tablet, Take 1 tablet (40 mg total) by mouth 2 (two) times daily., Disp: 180 tablet, Rfl: 3     ALLERGIES   Aspirin; Cephalexin; Codeine; Penicillins; and Sulfa antibiotics     REVIEW OF SYSTEMS   Review of Systems  Constitutional: Negative for chills, fever, malaise/fatigue and weight loss.  HENT: Negative for congestion.   Respiratory: Negative for cough, hemoptysis, sputum production, shortness of breath and wheezing.   Cardiovascular: Negative for leg swelling.  Gastrointestinal: Negative.   Neurological: Negative.   All other systems reviewed and are negative.  BP 126/76 (BP Location: Left Arm, Cuff Size: Normal)   Pulse 70   Ht _0  (1.702 m)   Wt 260 lb (117.9 kg)   SpO2 99%   BMI 40.72 kg/m     PHYSICAL EXAM   Physical Exam  Constitutional: She is oriented to person, place, and time. No distress.  HENT:  Head: Normocephalic and atraumatic.  Cardiovascular: Normal rate, regular rhythm and normal heart sounds.  No murmur heard. Pulmonary/Chest: No respiratory distress. She has no wheezes.  Musculoskeletal: Normal range of motion. She exhibits no edema.  Neurological: She is alert and oriented to person, place, and time.  Skin: She is not diaphoretic.  Psychiatric: She has a normal mood and affect.        ASSESSMENT/PLAN  77 yo obese white female seen today for follow up for Moderate COPD Grade B, In the setting of sleep apnea along with morbid obesity and deconditioned state  #1 shortness of breath and dyspnea related to her COPD as well as her obesity and deconditioned state Continue therapy for her COPD  #2 COPD moderate Gold stage B Continue Advair as prescribed Continue albuterol as needed Avoid secondhand smoke  #3 Obesity -recommend significant weight loss -recommend changing diet  #4 Deconditioned state -Recommend increased  daily activity and exercise   #5 patient with underlying sleep apnea on CPAP therapy Will need to download compliance report and assess patient's AHI   Patient satisfied with Plan of action and management. All questions answered Follow-up in 6 months  Vlad Mayberry Patricia Pesa, M.D.  [facility]  Medical Director [place] Director [facility] Cardio-Pulmonary Department

## 2018-01-26 NOTE — Patient Instructions (Signed)
Continue inhalers as prescribed 

## 2018-02-02 ENCOUNTER — Ambulatory Visit: Payer: Medicare Other | Admitting: Internal Medicine

## 2018-02-23 DIAGNOSIS — R490 Dysphonia: Secondary | ICD-10-CM | POA: Diagnosis not present

## 2018-02-23 DIAGNOSIS — J309 Allergic rhinitis, unspecified: Secondary | ICD-10-CM | POA: Diagnosis not present

## 2018-03-09 DIAGNOSIS — E119 Type 2 diabetes mellitus without complications: Secondary | ICD-10-CM | POA: Diagnosis not present

## 2018-05-04 DIAGNOSIS — E785 Hyperlipidemia, unspecified: Secondary | ICD-10-CM | POA: Diagnosis not present

## 2018-05-04 DIAGNOSIS — I38 Endocarditis, valve unspecified: Secondary | ICD-10-CM | POA: Diagnosis not present

## 2018-05-04 DIAGNOSIS — R600 Localized edema: Secondary | ICD-10-CM | POA: Diagnosis not present

## 2018-05-04 DIAGNOSIS — R0602 Shortness of breath: Secondary | ICD-10-CM | POA: Diagnosis not present

## 2018-05-04 DIAGNOSIS — E039 Hypothyroidism, unspecified: Secondary | ICD-10-CM | POA: Diagnosis not present

## 2018-05-04 DIAGNOSIS — E782 Mixed hyperlipidemia: Secondary | ICD-10-CM | POA: Diagnosis not present

## 2018-05-04 DIAGNOSIS — Z130 Encounter for screening for diseases of the blood and blood-forming organs and certain disorders involving the immune mechanism: Secondary | ICD-10-CM | POA: Diagnosis not present

## 2018-05-04 DIAGNOSIS — G4733 Obstructive sleep apnea (adult) (pediatric): Secondary | ICD-10-CM | POA: Diagnosis not present

## 2018-05-04 DIAGNOSIS — E118 Type 2 diabetes mellitus with unspecified complications: Secondary | ICD-10-CM | POA: Diagnosis not present

## 2018-05-04 DIAGNOSIS — I10 Essential (primary) hypertension: Secondary | ICD-10-CM | POA: Diagnosis not present

## 2018-05-04 DIAGNOSIS — Z0000 Encounter for general adult medical examination without abnormal findings: Secondary | ICD-10-CM | POA: Diagnosis not present

## 2018-05-20 DIAGNOSIS — E039 Hypothyroidism, unspecified: Secondary | ICD-10-CM | POA: Diagnosis not present

## 2018-05-20 DIAGNOSIS — Z130 Encounter for screening for diseases of the blood and blood-forming organs and certain disorders involving the immune mechanism: Secondary | ICD-10-CM | POA: Diagnosis not present

## 2018-05-20 DIAGNOSIS — E118 Type 2 diabetes mellitus with unspecified complications: Secondary | ICD-10-CM | POA: Diagnosis not present

## 2018-05-20 DIAGNOSIS — E785 Hyperlipidemia, unspecified: Secondary | ICD-10-CM | POA: Diagnosis not present

## 2018-05-20 DIAGNOSIS — I10 Essential (primary) hypertension: Secondary | ICD-10-CM | POA: Diagnosis not present

## 2018-07-23 ENCOUNTER — Other Ambulatory Visit: Payer: Self-pay | Admitting: Internal Medicine

## 2018-07-23 ENCOUNTER — Encounter: Payer: Self-pay | Admitting: Internal Medicine

## 2018-07-23 ENCOUNTER — Ambulatory Visit (INDEPENDENT_AMBULATORY_CARE_PROVIDER_SITE_OTHER): Payer: Medicare Other | Admitting: Internal Medicine

## 2018-07-23 VITALS — BP 136/90 | HR 65 | Ht 67.0 in | Wt 264.0 lb

## 2018-07-23 DIAGNOSIS — J449 Chronic obstructive pulmonary disease, unspecified: Secondary | ICD-10-CM

## 2018-07-23 DIAGNOSIS — J441 Chronic obstructive pulmonary disease with (acute) exacerbation: Secondary | ICD-10-CM

## 2018-07-23 MED ORDER — ALBUTEROL SULFATE HFA 108 (90 BASE) MCG/ACT IN AERS
2.0000 | INHALATION_SPRAY | RESPIRATORY_TRACT | 6 refills | Status: AC | PRN
Start: 2018-07-23 — End: ?

## 2018-07-23 MED ORDER — PREDNISONE 20 MG PO TABS: 20.0000 mg | ORAL_TABLET | Freq: Every day | ORAL | 0 refills | Status: DC

## 2018-07-23 NOTE — Patient Instructions (Addendum)
Prednisone 20 mg daily for 10 days   Please call you insurance company and obtain copy of your medication formulary  For inhalers  This will help your Pulmonologist to prescribe the most cost effective medications that your insurance company allows.    I will need CPAP report at next visit Check 6MWT

## 2018-07-23 NOTE — Progress Notes (Signed)
[facility] Consultation     Date: 07/23/2018,   MRN# [id number] Darlene Robbins 10/29/1941      Admission                  Current  Darlene Robbins is a 77 y.o. old female seen in consultation for follow up SOB and acute bronchitis. 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  ACCU-CHEK FASTCLIX LANCETS MISC, 1 kit by Does not apply route 2 (two) times daily., Disp: 102 each, Rfl: 12 .  albuterol (PROVENTIL) (2.5 MG/3ML) 0.083% nebulizer solution, Take 3 mLs (2.5 mg total) by nebulization every 6 (six) hours as needed for wheezing or shortness of breath., Disp: 75 mL, Rfl: 12 .  albuterol (VENTOLIN HFA) 108 (90 BASE) MCG/ACT inhaler, Inhale 2 puffs into the lungs daily. (Patient taking differently: Inhale 2 puffs into the lungs every 4 (four) hours as needed. ), Disp: 36 g, Rfl: 12 .  benzonatate (TESSALON) 100 MG capsule, Take 1 capsule (100 mg total) by mouth every 8 (eight) hours., Disp: 30 capsule, Rfl: 0 .  Blood Glucose Calibration (ACCU-CHEK AVIVA) SOLN, 1 each by In Vitro route once., Disp: 1 each, Rfl: 2 .  Blood Glucose Monitoring Suppl (ONE TOUCH ULTRA SYSTEM KIT) W/DEVICE KIT, 1 kit by Does not apply route once. Use as directed. Test blood sugars 1-2 times daily., Disp: 1 each, Rfl: 0 .  cetirizine (ZYRTEC ALLERGY) 10 MG tablet, Take 1 tablet (10 mg total) by mouth daily., Disp: 90 tablet, Rfl: 3 .  diclofenac sodium (VOLTAREN) 1 % GEL, Apply 1 application topically 2 (two) times daily as needed.  , Disp: , Rfl:    .  DOCOSAHEXAENOIC ACID PO, Take 2 tablets by mouth daily., Disp: , Rfl:  .  fluorouracil (EFUDEX) 5 % cream, APP AA BID FOR 4 WEEKS, Disp: , Rfl: 0 .  Fluticasone-Salmeterol (ADVAIR DISKUS) 250-50 MCG/DOSE AEPB, Inhale 1 puff into the lungs 2 (two) times daily., Disp: 180 each, Rfl: 3 .  furosemide (LASIX) 20 MG tablet, Take 1 tablet (20 mg total) by mouth daily as needed., Disp: 90 tablet, Rfl: 4 .  gentamicin ointment (GARAMYCIN) 0.1 %, Apply 1 application topically 3 (three) times daily. (Patient taking differently: Apply 1 application topically 3 (three) times daily as needed. ), Disp: 30 g, Rfl: 3 .  glipiZIDE (GLUCOTROL XL) 5 MG 24 hr tablet, Take 5 mg by mouth daily., Disp: , Rfl:  .  glucose blood test strip, 1 each by Other route as needed for other. Use as instructed with One Touch Ultra Glucometer. Test blood sugars at home 1-2 times daily, Disp: 100 each, Rfl: 3 .  hydrocortisone 2.5 % cream, APPLY TO THE AFFECTED ON FACE BID PRN., Disp: , Rfl: 4 .  levothyroxine (SYNTHROID, LEVOTHROID) 75 MCG tablet, Take 1 tablet (75 mcg total) by mouth daily., Disp: 90 tablet, Rfl: 3 .  losartan (COZAAR) 25 MG tablet, Take 25 mg by mouth daily., Disp: , Rfl:  .  metFORMIN (GLUCOPHAGE)  500 MG tablet, Take 1 tablet (500 mg total) by mouth 2 (two) times daily with a meal., Disp: 180 tablet, Rfl: 3 .  nystatin (MYCOSTATIN) 100000 UNIT/ML suspension, , Disp: , Rfl:  .  nystatin (MYCOSTATIN/NYSTOP) 100000 UNIT/GM POWD, Apply twice daily as needed, Disp: 60 g, Rfl: 3 .  ondansetron (ZOFRAN) 8 MG tablet, Take 1 tablet (8 mg total) by mouth every 8 (eight) hours as needed for nausea or vomiting., Disp: 30 tablet, Rfl: 3 .  pantoprazole (PROTONIX) 40 MG tablet, Take 1 tablet (40 mg total) by mouth 2 (two) times daily., Disp: 180 tablet, Rfl: 3     ALLERGIES   Aspirin; Cephalexin; Codeine; Penicillins; and Sulfa antibiotics     REVIEW OF SYSTEMS   Review of Systems  Constitutional: Negative for  chills, fever, malaise/fatigue and weight loss.  HENT: Negative for congestion.   Respiratory: Negative for cough, hemoptysis, sputum production, shortness of breath and wheezing.   Cardiovascular: Negative for leg swelling.  Gastrointestinal: Negative.   Neurological: Negative.   Psychiatric/Behavioral: The patient is not nervous/anxious.   All other systems reviewed and are negative.  Ht '5\' 7"'  (1.702 m)   BMI 40.72 kg/m   BP 136/90 (BP Location: Left Arm, Cuff Size: Large)   Pulse 65   Ht '5\' 7"'  (1.702 m)   Wt 264 lb (119.7 kg)   SpO2 99%   BMI 41.35 kg/m    PHYSICAL EXAM   Physical Exam  Constitutional: She is oriented to person, place, and time. No distress.  HENT:  Head: Normocephalic and atraumatic.  Cardiovascular: Normal rate, regular rhythm and normal heart sounds.  No murmur heard. Pulmonary/Chest: No respiratory distress. She has no wheezes.  Musculoskeletal: Normal range of motion. She exhibits no edema.  Neurological: She is alert and oriented to person, place, and time.  Skin: She is not diaphoretic.  Psychiatric: She has a normal mood and affect.        ASSESSMENT/PLAN  77 yo obese white female seen today for follow up for Moderate COPD Grade C, In the setting of sleep apnea along with morbid obesity and deconditioned state  -shortness of breath and dyspnea related to her COPD as well as her obesity and deconditioned state Continue therapy for her COPD She will need 6 MWT to assess for hypxoa  -COPD moderate Gold stage C Seems that her symptoms are getting worse She will need an additional inhaler but  I have asked her to call Insurance company to order correct inhaler Continue Advair as prescribed Continue albuterol as needed Avoid secondhand smoke   COPD exacerbation Prednisone 20 mg daily for 10 days  OSA -patient with underlying sleep apnea on CPAP therapy Will need to download compliance report and assess patient's AHI at next  OV  Obesity -recommend significant weight loss -recommend changing diet  Deconditioned state -Recommend increased daily activity and exercise   Patient are satisfied with Plan of action and management. All questions answered Follow up in 6  Months  Darlene Pecor Patricia Pesa, M.D.  [facility]  Medical Director [place] Director [facility] Cardio-Pulmonary Department

## 2018-07-28 ENCOUNTER — Ambulatory Visit (INDEPENDENT_AMBULATORY_CARE_PROVIDER_SITE_OTHER): Payer: Medicare Other | Admitting: *Deleted

## 2018-07-28 DIAGNOSIS — J441 Chronic obstructive pulmonary disease with (acute) exacerbation: Secondary | ICD-10-CM

## 2018-07-28 NOTE — Progress Notes (Signed)
SIX MIN WALK 07/28/2018 12/21/2015 11/22/2013  Medications Zyrtec, Glipizide, Levothyroxine, Metformin, Protonix, Prednisone Zyrtec, Levothyroxine, Metformin, Myrbetriq, Protonix -  Supplimental Oxygen during Test? (L/min) No No No  Laps 5 5 -  Partial Lap (in Meters) 24 24 -  Baseline BP (sitting) 138/86 138/88 -  Baseline Heartrate 66 79 -  Baseline Dyspnea (Borg Scale) 2 0.5 -  Baseline Fatigue (Borg Scale) 0 0 -  Baseline SPO2 98 98 -  BP (sitting) 152/98 146/92 -  Heartrate 104 114 -  Dyspnea (Borg Scale) 5 3 -  Fatigue (Borg Scale) 0 0 -  SPO2 98 98 -  BP (sitting) 148/88 136/84 -  Heartrate 72 90 -  SPO2 99 98 -  Stopped or Paused before Six Minutes No No -  Interpretation Dizziness - -  Distance Completed 264 264 -  Tech Comments: Pt walked at moderate pace. pt walked at moderate pace w/o complications Lowest 02 sat was 95%

## 2018-08-19 ENCOUNTER — Encounter: Payer: Self-pay | Admitting: Emergency Medicine

## 2018-08-19 ENCOUNTER — Other Ambulatory Visit: Payer: Self-pay

## 2018-08-19 ENCOUNTER — Ambulatory Visit
Admission: EM | Admit: 2018-08-19 | Discharge: 2018-08-19 | Disposition: A | Discharge status: Home / Self Care | Payer: Medicare Other | Attending: Family Medicine | Admitting: Family Medicine

## 2018-08-19 DIAGNOSIS — N3090 Cystitis, unspecified without hematuria: Secondary | ICD-10-CM

## 2018-08-19 DIAGNOSIS — Z79899 Other long term (current) drug therapy: Secondary | ICD-10-CM | POA: Insufficient documentation

## 2018-08-19 DIAGNOSIS — R300 Dysuria: Secondary | ICD-10-CM | POA: Diagnosis present

## 2018-08-19 DIAGNOSIS — N3091 Cystitis, unspecified with hematuria: Secondary | ICD-10-CM

## 2018-08-19 DIAGNOSIS — Z7984 Long term (current) use of oral hypoglycemic drugs: Secondary | ICD-10-CM | POA: Insufficient documentation

## 2018-08-19 LAB — URINALYSIS, COMPLETE (UACMP) WITH MICROSCOPIC
Bilirubin Urine: NEGATIVE
GLUCOSE, UA: NEGATIVE mg/dL
Nitrite: POSITIVE — AB
PH: 5 (ref 5.0–8.0)
Protein, ur: 100 mg/dL — AB
RBC / HPF: >50 RBC/hpf (ref 0–5)
Specific Gravity, Urine: 1.025 (ref 1.005–1.030)

## 2018-08-19 MED ORDER — PHENAZOPYRIDINE HCL 200 MG PO TABS: 200.0000 mg | ORAL_TABLET | Freq: Three times a day (TID) | ORAL | 0 refills | Status: DC

## 2018-08-19 MED ORDER — NITROFURANTOIN MONOHYD MACRO 100 MG PO CAPS: 100.0000 mg | ORAL_CAPSULE | Freq: Two times a day (BID) | ORAL | 0 refills | Status: DC

## 2018-08-19 NOTE — Discharge Instructions (Signed)
Drink plenty of fluids

## 2018-08-19 NOTE — ED Provider Notes (Signed)
[facility]    CSN: [id number] Arrival date & time: 08/19/18  1541     History   Chief Complaint Chief Complaint  Patient presents with  . Dysuria    HPI Darlene Robbins is a 77 y.o. female.   HPI  77 year old female presents with 1 day history of dysuria.  She is had no fever or chills denies nausea or vomiting.  No vaginal discharge or vaginal bleeding.  Drinking cranberry juice and taking cranberry capsules but despite this is complaining of urgency frequency and dysuria towards the end of micturition.  She states that this feels like previous urinary tract infections that she is had but not as severe as those.         Past Medical History:  Diagnosis Date  . Abdominal pain    Resolved,MRSA and yeast likely colonization in setting of multiple antibiotics.  . Arthritis    uses celebrex  . Benign essential HTN 03/02/2015  . Cancer ([place])    skin ca  . COPD (chronic obstructive pulmonary disease) ([place])    followed by Dr.Fleming  . Heart valve disease 10/17/2015  . Hypertension 07/30/2012  . Kidney stones    laser surgery in the past  . Leg varices 04/25/2014  . Sleep apnea    CPAP 7?  . Thyroid disease   . Varicose veins 09/05/2015  . Vitamin D deficiency 07-11-2010   drisdol 50,000 units 1 weekly x 12 weeks no refill active    Patient Active Problem List   Diagnosis Date Noted  . Secondary DM without complications ([place]) 83/15/1761  . 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 Nausea with vomiting 12/03/2015  . COPD exacerbation ([place]) 10/30/2015  . Thrush 10/30/2015  . Mild persistent asthma with acute exacerbation 10/27/2015  . Edema leg 10/17/2015  . Heart valve disease 10/17/2015  . 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 Varicose veins 09/05/2015  . Abdominal wall cramping 09/05/2015  . Benign essential HTN 03/02/2015  . Obstructive apnea 08/24/2014  . Severe obesity (BMI >= 40) ([place]) 06/29/2014  . Leg varices  04/25/2014  . Microscopic hematuria 04/06/2014  . Diabetes type 2, controlled ([place]) 06/16/2013  . Medicare annual wellness visit, subsequent 06/16/2013  . COPD (chronic obstructive pulmonary disease) ([place]) 03/21/2013  . Obesity 03/11/2013  . Breath shortness 03/11/2013  . Calculus of kidney 08/04/2012  . Urge incontinence of urine 08/04/2012  . Hypertension 07/30/2012  . Arthritis 09/04/2011  . Hypothyroidism 07/02/2011  . GERD (gastroesophageal reflux disease) 07/02/2011    Past Surgical History:  Procedure Laterality Date  . CATARACT EXTRACTION Bilateral    Dr. Rosana Berger  . laser surgery for kidney stones     followed by Dr.Humphries  . straighten nasal septum     Dr.McQueen  . TONSILLECTOMY    . VEIN SURGERY     Dr.Schnier    OB History   None      Home Medications    Prior to Admission medications   Medication Sig Start Date End Date Taking? Authorizing Provider  albuterol (PROVENTIL) (2.5 MG/3ML) 0.083% nebulizer solution USE 1 VIAL VIA NEBULIZER EVERY 6 HOURS AS NEEDED FOR WHEEZING OR SHORTNESS OF BREATH. 07/26/18  Yes Kasa, Kurian, MD  albuterol (VENTOLIN HFA) 108 (90 Base) MCG/ACT inhaler Inhale 2 puffs into the lungs every 4 (four) hours as needed. 07/23/18  Yes Flora Lipps, MD  cetirizine (ZYRTEC ALLERGY) 10 MG tablet Take 1 tablet (10 mg total) by mouth daily. 01/26/18  Yes Kasa,  Maretta Bees, MD  diclofenac sodium (VOLTAREN) 1 % GEL Apply 1 application topically 2 (two) times daily as needed.     Yes [provider]  Fluticasone-Salmeterol (ADVAIR DISKUS) 250-50 MCG/DOSE AEPB Inhale 1 puff into the lungs 2 (two) times daily. 01/26/18  Yes Flora Lipps, MD  furosemide (LASIX) 20 MG tablet Take 1 tablet (20 mg total) by mouth daily as needed. 09/06/12  Yes [name], MD  gentamicin ointment (GARAMYCIN) 0.1 % Apply 1 application topically 3 (three) times daily. Patient taking differently: Apply 1 application topically 3 (three) times daily as needed.   08/22/15  Yes [name], MD  glipiZIDE (GLUCOTROL XL) 5 MG 24 hr tablet Take 5 mg by mouth daily. 12/17/16 08/19/18 Yes [provider]  hydrocortisone 2.5 % cream APPLY TO THE AFFECTED ON FACE BID PRN. 10/17/15  Yes [provider]  levothyroxine (SYNTHROID, LEVOTHROID) 75 MCG tablet Take 1 tablet (75 mcg total) by mouth daily. 05/14/16  Yes [name], MD  losartan (COZAAR) 25 MG tablet Take 25 mg by mouth daily. 03/13/17 08/19/18 Yes [provider]  metFORMIN (GLUCOPHAGE) 500 MG tablet Take 1 tablet (500 mg total) by mouth 2 (two) times daily with a meal. 05/14/16 08/19/18 Yes [name], MD  nystatin (MYCOSTATIN) 100000 UNIT/ML suspension  10/29/15  Yes [provider]  nystatin (MYCOSTATIN/NYSTOP) 100000 UNIT/GM POWD Apply twice daily as needed 09/19/15  Yes [name], MD  ondansetron (ZOFRAN) 8 MG tablet Take 1 tablet (8 mg total) by mouth every 8 (eight) hours as needed for nausea or vomiting. 12/03/15  Yes [name], MD  pantoprazole (PROTONIX) 40 MG tablet Take 1 tablet (40 mg total) by mouth 2 (two) times daily. 05/14/16 08/19/18 Yes [name], MD  ACCU-CHEK FASTCLIX LANCETS MISC 1 kit by Does not apply route 2 (two) times daily. 01/30/16   [name], MD  Blood Glucose Calibration (ACCU-CHEK AVIVA) SOLN 1 each by In Vitro route once. 05/17/13   [name], MD  Blood Glucose Monitoring Suppl (ONE TOUCH ULTRA SYSTEM KIT) W/DEVICE KIT 1 kit by Does not apply route once. Use as directed. Test blood sugars 1-2 times daily. 02/05/15   [name], MD  glucose blood test strip 1 each by Other route as needed for other. Use as instructed with One Touch Ultra Glucometer. Test blood sugars at home 1-2 times daily 02/05/15   [name], MD  nitrofurantoin, macrocrystal-monohydrate, (MACROBID) 100 MG capsule Take 1 capsule (100 mg total) by mouth 2 (two) times daily. 08/19/18   Lorin Picket, PA-C    phenazopyridine (PYRIDIUM) 200 MG tablet Take 1 tablet (200 mg total) by mouth 3 (three) times daily. 08/19/18   Lorin Picket, PA-C    Family History Family History  Problem Relation Age of Onset  . Peripheral vascular disease Mother   . Heart disease Mother   . COPD Mother   . Dementia Father   . ALS Brother   . Heart disease Brother        s/p CABG  . Breast cancer Cousin   . Breast cancer Cousin     Social History Social History   Tobacco Use  . Smoking status: Never Smoker  . Smokeless tobacco: Never Used  Substance Use Topics  . Alcohol use: No    Alcohol/week: 0.0 standard drinks  . Drug use: No     Allergies   Aspirin; Cephalexin; Codeine; Penicillins; and Sulfa  antibiotics   Review of Systems Review of Systems  Constitutional: Positive for activity change. Negative for chills, fatigue and fever.  Genitourinary: Positive for dysuria, frequency, hematuria and urgency. Negative for vaginal discharge.  All other systems reviewed and are negative.    Physical Exam Triage Vital Signs ED Triage Vitals  Enc Vitals Group     BP 08/19/18 1552 (!) 160/70     Pulse Rate 08/19/18 1552 83     Resp 08/19/18 1552 16     Temp 08/19/18 1552 97.7 F (36.5 C)     Temp Source 08/19/18 1552 Oral     SpO2 08/19/18 1552 99 %     Weight 08/19/18 1553 260 lb (117.9 kg)     Height 08/19/18 1553 '5\' 7"'  (1.702 m)     Head Circumference --      Peak Flow --      Pain Score 08/19/18 1553 0     Pain Loc --      Pain Edu? --      Excl. in [place]? --    No data found.  Updated Vital Signs BP (!) 160/70 (BP Location: Left Arm)   Pulse 83   Temp 97.7 F (36.5 C) (Oral)   Resp 16   Ht '5\' 7"'  (1.702 m)   Wt 260 lb (117.9 kg)   SpO2 99%   BMI 40.72 kg/m   Visual Acuity Right Eye Distance:   Left Eye Distance:   Bilateral Distance:    Right Eye Near:   Left Eye Near:    Bilateral Near:     Physical Exam  Constitutional: She is oriented to person, place, and  time. She appears well-developed and well-nourished. No distress.  HENT:  Head: Normocephalic.  Eyes: Pupils are equal, round, and reactive to light. Right eye exhibits no discharge. Left eye exhibits no discharge.  Neck: Normal range of motion.  Abdominal: Soft. Bowel sounds are normal.  No CVA tenderness  Musculoskeletal: Normal range of motion.  Neurological: She is alert and oriented to person, place, and time.  Skin: Skin is warm and dry. She is not diaphoretic.  Psychiatric: She has a normal mood and affect. Her behavior is normal. Judgment and thought content normal.  Nursing note and vitals reviewed.    UC Treatments / Results  Labs (all labs ordered are listed, but only abnormal results are displayed) Labs Reviewed  URINALYSIS, COMPLETE (UACMP) WITH MICROSCOPIC - Abnormal; Notable for the following components:      Result Value   APPearance CLOUDY (*)    Hgb urine dipstick LARGE (*)    Ketones, ur TRACE (*)    Protein, ur 100 (*)    Nitrite POSITIVE (*)    Leukocytes, UA SMALL (*)    Bacteria, UA MANY (*)    All other components within normal limits  URINE CULTURE    EKG None  Radiology No results found.  Procedures Procedures (including critical care time)  Medications Ordered in UC Medications - No data to display  Initial Impression / Assessment and Plan / UC Course  I have reviewed the triage vital signs and the nursing notes.  Pertinent labs & imaging results that were available during my care of the patient were reviewed by me and considered in my medical decision making (see chart for details).    He is allergic to cephalexin, penicillins and sulfa drugs.  We will place her on Macrobid and also Pyridium for the burning.  Warned regarding orange  urine from the Pyridium.  We will culture the urine and that should be available in 2 days.  Encouraged her to increase her fluid intake.     Final Clinical Impressions(s) / UC Diagnoses   Final  diagnoses:  Cystitis     Discharge Instructions     Drink plenty of fluids.   ED Prescriptions    Medication Sig Dispense Auth. Provider   nitrofurantoin, macrocrystal-monohydrate, (MACROBID) 100 MG capsule Take 1 capsule (100 mg total) by mouth 2 (two) times daily. 10 capsule Crecencio Mc P, PA-C   phenazopyridine (PYRIDIUM) 200 MG tablet Take 1 tablet (200 mg total) by mouth 3 (three) times daily. 6 tablet Lorin Picket, PA-C     Controlled Substance Prescriptions [place] Controlled Substance Registry consulted? Not Applicable   Lorin Picket, PA-C 08/19/18 1721

## 2018-08-19 NOTE — ED Triage Notes (Signed)
Patient in today c/o 1 day history of dysuria. Patient denies fever. Patient has been drinking cranberry juice and taking cranberry capsules.

## 2018-08-22 LAB — URINE CULTURE: Culture: >=100000 — AB

## 2018-08-23 ENCOUNTER — Telehealth (HOSPITAL_COMMUNITY): Payer: Self-pay

## 2018-08-23 MED ORDER — CIPROFLOXACIN HCL 500 MG PO TABS: 500.0000 mg | ORAL_TABLET | Freq: Two times a day (BID) | ORAL | 0 refills | Status: AC

## 2018-08-23 NOTE — Telephone Encounter (Signed)
Urine culture positive for Klebsiella Pneumoniae. This is resistant to macrobid given to patient at visit. Rx for Cipro 500 mg BID x 3 days sent to pharmacy of record. Attempted to reach patient. No answer at this time.

## 2018-08-23 NOTE — Telephone Encounter (Signed)
Per Loura Halt FNP, if patient is still taking Glipizide, you should monitor your blood sugar while taking this medication.

## 2018-08-25 ENCOUNTER — Telehealth (HOSPITAL_COMMUNITY): Payer: Self-pay

## 2018-08-25 NOTE — Telephone Encounter (Signed)
Attempted to reach patient x 2. No answer. Voicemail left.

## 2018-08-25 NOTE — Telephone Encounter (Signed)
Pt aware of results and new rx

## 2018-10-14 DIAGNOSIS — H60549 Acute eczematoid otitis externa, unspecified ear: Secondary | ICD-10-CM | POA: Diagnosis not present

## 2018-10-14 DIAGNOSIS — R070 Pain in throat: Secondary | ICD-10-CM | POA: Diagnosis not present

## 2018-10-14 DIAGNOSIS — R42 Dizziness and giddiness: Secondary | ICD-10-CM | POA: Diagnosis not present

## 2018-10-14 DIAGNOSIS — J029 Acute pharyngitis, unspecified: Secondary | ICD-10-CM | POA: Diagnosis not present

## 2018-10-22 DIAGNOSIS — R42 Dizziness and giddiness: Secondary | ICD-10-CM | POA: Diagnosis not present

## 2018-10-29 ENCOUNTER — Other Ambulatory Visit: Payer: Self-pay | Admitting: Unknown Physician Specialty

## 2018-10-29 DIAGNOSIS — B379 Candidiasis, unspecified: Secondary | ICD-10-CM | POA: Diagnosis not present

## 2018-10-29 DIAGNOSIS — R42 Dizziness and giddiness: Secondary | ICD-10-CM | POA: Diagnosis not present

## 2018-10-29 DIAGNOSIS — B370 Candidal stomatitis: Secondary | ICD-10-CM | POA: Diagnosis not present

## 2018-11-11 DIAGNOSIS — R42 Dizziness and giddiness: Secondary | ICD-10-CM | POA: Diagnosis not present

## 2018-11-18 ENCOUNTER — Other Ambulatory Visit
Admission: RE | Admit: 2018-11-18 | Discharge: 2018-11-18 | Disposition: A | Discharge status: Home / Self Care | Payer: Medicare Other | Source: Home / Self Care | Attending: Unknown Physician Specialty | Admitting: Unknown Physician Specialty

## 2018-11-18 ENCOUNTER — Ambulatory Visit
Admission: RE | Admit: 2018-11-18 | Discharge: 2018-11-18 | Disposition: A | Discharge status: Home / Self Care | Payer: Medicare Other | Source: Ambulatory Visit | Attending: Unknown Physician Specialty | Admitting: Unknown Physician Specialty

## 2018-11-18 DIAGNOSIS — R42 Dizziness and giddiness: Secondary | ICD-10-CM | POA: Insufficient documentation

## 2018-11-18 LAB — BUN: BUN: 14 mg/dL (ref 8–23)

## 2018-11-18 LAB — CREATININE, SERUM
CREATININE: 0.94 mg/dL (ref 0.44–1.00)
GFR calc Af Amer: >60 mL/min (ref >60)
GFR calc non Af Amer: 59 mL/min — ABNORMAL LOW (ref >60)

## 2018-11-18 MED ORDER — GADOBUTROL 1 MMOL/ML IV SOLN
10.0000 mL | Freq: Once | INTRAVENOUS | Status: AC | PRN
  Administered 2018-11-18: 10 mL via INTRAVENOUS

## 2018-12-28 DIAGNOSIS — R42 Dizziness and giddiness: Secondary | ICD-10-CM | POA: Diagnosis not present

## 2019-01-05 DIAGNOSIS — R42 Dizziness and giddiness: Secondary | ICD-10-CM | POA: Diagnosis not present

## 2019-01-11 DIAGNOSIS — J340 Abscess, furuncle and carbuncle of nose: Secondary | ICD-10-CM | POA: Diagnosis not present

## 2019-01-11 DIAGNOSIS — J209 Acute bronchitis, unspecified: Secondary | ICD-10-CM | POA: Diagnosis not present

## 2019-01-16 ENCOUNTER — Other Ambulatory Visit: Payer: Self-pay

## 2019-01-16 ENCOUNTER — Encounter: Payer: Self-pay | Admitting: Gynecology

## 2019-01-16 ENCOUNTER — Ambulatory Visit
Admission: EM | Admit: 2019-01-16 | Discharge: 2019-01-16 | Disposition: A | Discharge status: Home / Self Care | Payer: Medicare Other | Attending: Emergency Medicine | Admitting: Emergency Medicine

## 2019-01-16 DIAGNOSIS — Z23 Encounter for immunization: Secondary | ICD-10-CM | POA: Diagnosis not present

## 2019-01-16 DIAGNOSIS — W6133XA Pecked by chicken, initial encounter: Secondary | ICD-10-CM | POA: Diagnosis not present

## 2019-01-16 DIAGNOSIS — S81859A Open bite, unspecified lower leg, initial encounter: Secondary | ICD-10-CM | POA: Diagnosis not present

## 2019-01-16 MED ORDER — TETANUS-DIPHTH-ACELL PERTUSSIS 5-2.5-18.5 LF-MCG/0.5 IM SUSP
0.5000 mL | Freq: Once | INTRAMUSCULAR | Status: AC
  Administered 2019-01-16: 0.5 mL via INTRAMUSCULAR

## 2019-01-16 MED ORDER — MUPIROCIN 2 % EX OINT: 1.0000 "application " | TOPICAL_OINTMENT | Freq: Three times a day (TID) | CUTANEOUS | 0 refills | Status: DC

## 2019-01-16 MED ORDER — DOXYCYCLINE HYCLATE 100 MG PO CAPS: 100.0000 mg | ORAL_CAPSULE | Freq: Two times a day (BID) | ORAL | 0 refills | Status: DC

## 2019-01-16 NOTE — ED Provider Notes (Addendum)
[facility]    CSN: [id number] Arrival date & time: 01/16/19  1507     History   Chief Complaint No chief complaint on file.   HPI Darlene Robbins is a 78 y.o. female.   HPI  78 year old female presents today for all small lacerations on her left lower extremity after a rooster attacked her repeatedly.  Finally beat the rooster to death to stop his attack.  He has 2 small lacerations over the anterior leg the one is larger measuring approximately 5 mm and the other but more of a puncture wound that is more proximal she has a scratch on the posterior distal calf that does not penetrate the dermis.  Received a tetanus toxoid injection here.  He is currently on long therapy taking 750 mg of Levaquin  for the next 9 days.  Was prescribed for bronchitis.  States she is already taken 5 pills        Past Medical History:  Diagnosis Date  . Abdominal pain    Resolved,MRSA and yeast likely colonization in setting of multiple antibiotics.  . Arthritis    uses celebrex  . Benign essential HTN 03/02/2015  . Cancer ([place])    skin ca  . COPD (chronic obstructive pulmonary disease) ([place])    followed by Dr.Fleming  . Heart valve disease 10/17/2015  . Hypertension 07/30/2012  . Kidney stones    laser surgery in the past  . Leg varices 04/25/2014  . Sleep apnea    CPAP 7?  . Thyroid disease   . Varicose veins 09/05/2015  . Vitamin D deficiency 07-11-2010   drisdol 50,000 units 1 weekly x 12 weeks no refill active    Patient Active Problem List   Diagnosis Date Noted  . Secondary DM without complications ([place]) 83/41/9622  . 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 Nausea with vomiting 12/03/2015  . COPD exacerbation ([place]) 10/30/2015  . Thrush 10/30/2015  . Mild persistent asthma with acute exacerbation 10/27/2015  . Edema leg 10/17/2015  . Heart valve disease 10/17/2015  . 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 Varicose veins 09/05/2015  .  Abdominal wall cramping 09/05/2015  . Benign essential HTN 03/02/2015  . Obstructive apnea 08/24/2014  . Severe obesity (BMI >= 40) ([place]) 06/29/2014  . Leg varices 04/25/2014  . Microscopic hematuria 04/06/2014  . Diabetes type 2, controlled ([place]) 06/16/2013  . Medicare annual wellness visit, subsequent 06/16/2013  . COPD (chronic obstructive pulmonary disease) ([place]) 03/21/2013  . Obesity 03/11/2013  . Breath shortness 03/11/2013  . Calculus of kidney 08/04/2012  . Urge incontinence of urine 08/04/2012  . Hypertension 07/30/2012  . Arthritis 09/04/2011  . Hypothyroidism 07/02/2011  . GERD (gastroesophageal reflux disease) 07/02/2011    Past Surgical History:  Procedure Laterality Date  . CATARACT EXTRACTION Bilateral    Dr. Rosana Berger  . laser surgery for kidney stones     followed by Dr.Humphries  . straighten nasal septum     Dr.McQueen  . TONSILLECTOMY    . VEIN SURGERY     Dr.Schnier    OB History   No obstetric history on file.      Home Medications    Prior to Admission medications   Medication Sig Start Date End Date Taking? Authorizing Provider  albuterol (PROVENTIL) (2.5 MG/3ML) 0.083% nebulizer solution USE 1 VIAL VIA NEBULIZER EVERY 6 HOURS AS NEEDED FOR WHEEZING OR SHORTNESS OF BREATH. 07/26/18  Yes Flora Lipps, MD  albuterol (VENTOLIN HFA) 108 (90 Base)  MCG/ACT inhaler Inhale 2 puffs into the lungs every 4 (four) hours as needed. 07/23/18  Yes Flora Lipps, MD  Blood Glucose Calibration (ACCU-CHEK AVIVA) SOLN 1 each by In Vitro route once. 05/17/13  Yes [name], MD  Blood Glucose Monitoring Suppl (ONE TOUCH ULTRA SYSTEM KIT) W/DEVICE KIT 1 kit by Does not apply route once. Use as directed. Test blood sugars 1-2 times daily. 02/05/15  Yes [name], MD  cetirizine (ZYRTEC ALLERGY) 10 MG tablet Take 1 tablet (10 mg total) by mouth daily. 01/26/18  Yes Flora Lipps, MD  diclofenac sodium (VOLTAREN) 1 % GEL Apply 1 application topically 2 (two)  times daily as needed.     Yes [provider]  Fluticasone-Salmeterol (ADVAIR DISKUS) 250-50 MCG/DOSE AEPB Inhale 1 puff into the lungs 2 (two) times daily. 01/26/18  Yes Flora Lipps, MD  furosemide (LASIX) 20 MG tablet Take 1 tablet (20 mg total) by mouth daily as needed. 09/06/12  Yes [name], MD  glipiZIDE (GLUCOTROL XL) 5 MG 24 hr tablet Take by mouth. 01/11/18  Yes [provider]  glucose blood test strip 1 each by Other route as needed for other. Use as instructed with One Touch Ultra Glucometer. Test blood sugars at home 1-2 times daily 02/05/15  Yes [name], MD  levofloxacin (LEVAQUIN) 750 MG tablet Take 750 mg by mouth daily.   Yes [provider]  levothyroxine (SYNTHROID, LEVOTHROID) 75 MCG tablet Take 1 tablet (75 mcg total) by mouth daily. 05/14/16  Yes [name], MD  nystatin (MYCOSTATIN/NYSTOP) 100000 UNIT/GM POWD Apply twice daily as needed 09/19/15  Yes [name], MD  ondansetron (ZOFRAN) 8 MG tablet Take 1 tablet (8 mg total) by mouth every 8 (eight) hours as needed for nausea or vomiting. 12/03/15  Yes [name], MD  pantoprazole (PROTONIX) 40 MG tablet Take 1 tablet (40 mg total) by mouth 2 (two) times daily. 05/14/16 01/16/19 Yes [name], MD  prednisoLONE (ORAPRED ODT) 10 MG disintegrating tablet Take 10 mg by mouth daily.   Yes [provider]  ACCU-CHEK FASTCLIX LANCETS MISC 1 kit by Does not apply route 2 (two) times daily. 01/30/16   [name], MD  gentamicin ointment (GARAMYCIN) 0.1 % Apply 1 application topically 3 (three) times daily. Patient taking differently: Apply 1 application topically 3 (three) times daily as needed.  08/22/15   [name], MD  hydrocortisone 2.5 % cream APPLY TO THE AFFECTED ON FACE BID PRN. 10/17/15   [provider]  losartan (COZAAR) 25 MG tablet Take 25 mg by mouth daily. 03/13/17 08/19/18  [provider]  metFORMIN  (GLUCOPHAGE) 500 MG tablet Take 1 tablet (500 mg total) by mouth 2 (two) times daily with a meal. 05/14/16 08/19/18  [name], MD  mupirocin ointment (BACTROBAN) 2 % Apply 1 application topically 3 (three) times daily. 01/16/19   Lorin Picket, PA-C  nitrofurantoin, macrocrystal-monohydrate, (MACROBID) 100 MG capsule Take 1 capsule (100 mg total) by mouth 2 (two) times daily. 08/19/18   Lorin Picket, PA-C  nystatin (MYCOSTATIN) 100000 UNIT/ML suspension  10/29/15   [provider]  phenazopyridine (PYRIDIUM) 200 MG tablet Take 1 tablet (200 mg total) by mouth 3 (three) times daily. 08/19/18   Lorin Picket, PA-C    Family History Family History  Problem Relation Age of Onset  . Peripheral vascular disease Mother   . Heart disease Mother   . COPD  Mother   . Dementia Father   . ALS Brother   . Heart disease Brother        s/p CABG  . Breast cancer Cousin   . Breast cancer Cousin     Social History Social History   Tobacco Use  . Smoking status: Never Smoker  . Smokeless tobacco: Never Used  Substance Use Topics  . Alcohol use: No    Alcohol/week: 0.0 standard drinks  . Drug use: No     Allergies   Aspirin; Cephalexin; Codeine; Penicillins; and Sulfa antibiotics   Review of Systems Review of Systems  Constitutional: Negative for activity change, appetite change, chills, fatigue and fever.  Skin: Positive for color change and wound.  All other systems reviewed and are negative.    Physical Exam Triage Vital Signs ED Triage Vitals  Enc Vitals Group     BP 01/16/19 1548 135/74     Pulse Rate 01/16/19 1548 82     Resp 01/16/19 1548 16     Temp 01/16/19 1548 97.9 F (36.6 C)     Temp src --      SpO2 01/16/19 1548 99 %     Weight --      Height --      Head Circumference --      Peak Flow --      Pain Score 01/16/19 1719 0     Pain Loc --      Pain Edu? --      Excl. in [place]? --    No data found.  Updated Vital Signs BP 135/74 (BP  Location: Left Arm)   Pulse 82   Temp 97.9 F (36.6 C)   Resp 16   SpO2 99%   Visual Acuity Right Eye Distance:   Left Eye Distance:   Bilateral Distance:    Right Eye Near:   Left Eye Near:    Bilateral Near:     Physical Exam Vitals signs and nursing note reviewed.  Constitutional:      General: She is not in acute distress.    Appearance: Normal appearance. She is obese. She is not ill-appearing, toxic-appearing or diaphoretic.  HENT:     Head: Normocephalic and atraumatic.  Eyes:     General:        Right eye: No discharge.        Left eye: No discharge.     Conjunctiva/sclera: Conjunctivae normal.  Neck:     Musculoskeletal: Normal range of motion and neck supple.  Pulmonary:     Effort: Pulmonary effort is normal.     Breath sounds: Normal breath sounds.  Musculoskeletal: Normal range of motion.        General: Tenderness and signs of injury present.  Skin:    General: Skin is warm and dry.     Findings: Erythema present.     Comments: Exam of the left lower leg for description refer to HPI.  Also refer to attached photographs.  Neurological:     General: No focal deficit present.     Mental Status: She is alert and oriented to person, place, and time.  Psychiatric:        Mood and Affect: Mood normal.        Behavior: Behavior normal.        Thought Content: Thought content normal.        Judgment: Judgment normal.          UC Treatments / Results  Labs (all labs ordered are listed, but only abnormal results are displayed) Labs Reviewed - No data to display  EKG None  Radiology No results found.  Procedures Procedures (including critical care time)  Medications Ordered in UC Medications  Tdap (BOOSTRIX) injection 0.5 mL (0.5 mLs Intramuscular Given 01/16/19 1555)    Initial Impression / Assessment and Plan / UC Course  I have reviewed the triage vital signs and the nursing notes.  Pertinent labs & imaging results that were available  during my care of the patient were reviewed by me and considered in my medical decision making (see chart for details).   I have told the patient that the wounds would not be closed due to the increased risk of infection.  Apply Bactroban ointment 3 times daily after washing with soap and water.  May cover with a dry dressing.  Discussed signs and symptoms of infection in detail and to return if any these occur.  I would recommend she return to our clinic in 2 days for follow-up.  Reviewed "up-to-date" and it recommends Levaquin as a suitable alternative for animal bites.  We will just keep her on the Levaquin.   Final Clinical Impressions(s) / UC Diagnoses   Final diagnoses:  Animal bite of lower leg, initial encounter     Discharge Instructions     Bactroban ointment to the wounds 3 times daily after cleansing with soap and water.  Any signs or symptoms of infection occur as we discussed return to our clinic or go to the emergency room immediately.   ED Prescriptions    Medication Sig Dispense Auth. Provider   doxycycline (VIBRAMYCIN) 100 MG capsule  (Status: Discontinued) Take 1 capsule (100 mg total) by mouth 2 (two) times daily. 6 capsule Crecencio Mc P, PA-C   mupirocin ointment (BACTROBAN) 2 % Apply 1 application topically 3 (three) times daily. 22 g Lorin Picket, PA-C     Controlled Substance Prescriptions [place] Controlled Substance Registry consulted? Not Applicable   Lorin Picket, PA-C 01/16/19 1813    Lorin Picket, PA-C 01/16/19 1815

## 2019-01-16 NOTE — ED Triage Notes (Signed)
Patient c/o a rooster attack her at her left leg x today. Per patient with laceration to her left leg

## 2019-01-16 NOTE — ED Notes (Signed)
Wounds dressed with bacitracin and telfa placed over wounds. Wrapped in [place]

## 2019-01-16 NOTE — Discharge Instructions (Addendum)
Bactroban ointment to the wounds 3 times daily after cleansing with soap and water.  Any signs or symptoms of infection occur as we discussed return to our clinic or go to the emergency room immediately.

## 2019-01-18 ENCOUNTER — Other Ambulatory Visit: Payer: Self-pay

## 2019-01-18 ENCOUNTER — Ambulatory Visit
Admission: EM | Admit: 2019-01-18 | Discharge: 2019-01-18 | Disposition: A | Discharge status: Home / Self Care | Payer: Medicare Other | Attending: Family Medicine | Admitting: Family Medicine

## 2019-01-18 DIAGNOSIS — S81859D Open bite, unspecified lower leg, subsequent encounter: Secondary | ICD-10-CM

## 2019-01-18 DIAGNOSIS — Z5189 Encounter for other specified aftercare: Secondary | ICD-10-CM | POA: Diagnosis not present

## 2019-01-18 NOTE — ED Provider Notes (Signed)
[facility]    CSN: [id number] Arrival date & time: 01/18/19  1127     History   Chief Complaint Chief Complaint  Patient presents with  . Wound Check    HPI Darlene Robbins is a 78 y.o. female.   HPI  Returns today stating that she is here for a wound check because of her left leg.  She had been packed by a rooster she states that the area has been improving.  She has had no drainage.  She has some mild swelling over the anterior most severe laceration.  No fever or chills.  She is continuing to take her Levaquin.      Past Medical History:  Diagnosis Date  . Abdominal pain    Resolved,MRSA and yeast likely colonization in setting of multiple antibiotics.  . Arthritis    uses celebrex  . Benign essential HTN 03/02/2015  . Cancer ([place])    skin ca  . COPD (chronic obstructive pulmonary disease) ([place])    followed by Dr.Fleming  . Heart valve disease 10/17/2015  . Hypertension 07/30/2012  . Kidney stones    laser surgery in the past  . Leg varices 04/25/2014  . Sleep apnea    CPAP 7?  . Thyroid disease   . Varicose veins 09/05/2015  . Vitamin D deficiency 07-11-2010   drisdol 50,000 units 1 weekly x 12 weeks no refill active    Patient Active Problem List   Diagnosis Date Noted  . Secondary DM without complications ([place]) 65/68/1275  . 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 Nausea with vomiting 12/03/2015  . COPD exacerbation ([place]) 10/30/2015  . Thrush 10/30/2015  . Mild persistent asthma with acute exacerbation 10/27/2015  . Edema leg 10/17/2015  . Heart valve disease 10/17/2015  . 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 Varicose veins 09/05/2015  . Abdominal wall cramping 09/05/2015  . Benign essential HTN 03/02/2015  . Obstructive apnea 08/24/2014  . Severe obesity (BMI >= 40) ([place]) 06/29/2014  . Leg varices 04/25/2014  . Microscopic hematuria 04/06/2014  . Diabetes type 2, controlled ([place]) 06/16/2013  . Medicare annual  wellness visit, subsequent 06/16/2013  . COPD (chronic obstructive pulmonary disease) ([place]) 03/21/2013  . Obesity 03/11/2013  . Breath shortness 03/11/2013  . Calculus of kidney 08/04/2012  . Urge incontinence of urine 08/04/2012  . Hypertension 07/30/2012  . Arthritis 09/04/2011  . Hypothyroidism 07/02/2011  . GERD (gastroesophageal reflux disease) 07/02/2011    Past Surgical History:  Procedure Laterality Date  . CATARACT EXTRACTION Bilateral    Dr. Rosana Berger  . laser surgery for kidney stones     followed by Dr.Humphries  . straighten nasal septum     Dr.McQueen  . TONSILLECTOMY    . VEIN SURGERY     Dr.Schnier    OB History   No obstetric history on file.      Home Medications    Prior to Admission medications   Medication Sig Start Date End Date Taking? Authorizing Provider  ACCU-CHEK FASTCLIX LANCETS MISC 1 kit by Does not apply route 2 (two) times daily. 01/30/16  Yes [name], MD  albuterol (PROVENTIL) (2.5 MG/3ML) 0.083% nebulizer solution USE 1 VIAL VIA NEBULIZER EVERY 6 HOURS AS NEEDED FOR WHEEZING OR SHORTNESS OF BREATH. 07/26/18  Yes Kasa, Kurian, MD  albuterol (VENTOLIN HFA) 108 (90 Base) MCG/ACT inhaler Inhale 2 puffs into the lungs every 4 (four) hours as needed. 07/23/18  Yes Flora Lipps, MD  Blood Glucose Calibration (  ACCU-CHEK AVIVA) SOLN 1 each by In Vitro route once. 05/17/13  Yes [name], MD  Blood Glucose Monitoring Suppl (ONE TOUCH ULTRA SYSTEM KIT) W/DEVICE KIT 1 kit by Does not apply route once. Use as directed. Test blood sugars 1-2 times daily. 02/05/15  Yes [name], MD  cetirizine (ZYRTEC ALLERGY) 10 MG tablet Take 1 tablet (10 mg total) by mouth daily. 01/26/18  Yes Flora Lipps, MD  diclofenac sodium (VOLTAREN) 1 % GEL Apply 1 application topically 2 (two) times daily as needed.     Yes [provider]  Fluticasone-Salmeterol (ADVAIR DISKUS) 250-50 MCG/DOSE AEPB Inhale 1 puff into the lungs 2 (two) times daily.  01/26/18  Yes Flora Lipps, MD  furosemide (LASIX) 20 MG tablet Take 1 tablet (20 mg total) by mouth daily as needed. 09/06/12  Yes [name], MD  gentamicin ointment (GARAMYCIN) 0.1 % Apply 1 application topically 3 (three) times daily. Patient taking differently: Apply 1 application topically 3 (three) times daily as needed.  08/22/15  Yes [name], MD  glipiZIDE (GLUCOTROL XL) 5 MG 24 hr tablet Take by mouth. 01/11/18  Yes [provider]  glucose blood test strip 1 each by Other route as needed for other. Use as instructed with One Touch Ultra Glucometer. Test blood sugars at home 1-2 times daily 02/05/15  Yes [name], MD  hydrocortisone 2.5 % cream APPLY TO THE AFFECTED ON FACE BID PRN. 10/17/15  Yes [provider]  levofloxacin (LEVAQUIN) 750 MG tablet Take 750 mg by mouth daily.   Yes [provider]  levothyroxine (SYNTHROID, LEVOTHROID) 75 MCG tablet Take 1 tablet (75 mcg total) by mouth daily. 05/14/16  Yes [name], MD  mupirocin ointment (BACTROBAN) 2 % Apply 1 application topically 3 (three) times daily. 01/16/19  Yes Lorin Picket, PA-C  nitrofurantoin, macrocrystal-monohydrate, (MACROBID) 100 MG capsule Take 1 capsule (100 mg total) by mouth 2 (two) times daily. 08/19/18  Yes Lorin Picket, PA-C  nystatin (MYCOSTATIN) 100000 UNIT/ML suspension  10/29/15  Yes [provider]  nystatin (MYCOSTATIN/NYSTOP) 100000 UNIT/GM POWD Apply twice daily as needed 09/19/15  Yes [name], MD  ondansetron (ZOFRAN) 8 MG tablet Take 1 tablet (8 mg total) by mouth every 8 (eight) hours as needed for nausea or vomiting. 12/03/15  Yes [name], MD  prednisoLONE (ORAPRED ODT) 10 MG disintegrating tablet Take 10 mg by mouth daily.   Yes [provider]  losartan (COZAAR) 25 MG tablet Take 25 mg by mouth daily. 03/13/17 08/19/18  [provider]  metFORMIN (GLUCOPHAGE) 500 MG tablet Take 1 tablet (500  mg total) by mouth 2 (two) times daily with a meal. 05/14/16 08/19/18  [name], MD  pantoprazole (PROTONIX) 40 MG tablet Take 1 tablet (40 mg total) by mouth 2 (two) times daily. 05/14/16 01/16/19  [name], MD  phenazopyridine (PYRIDIUM) 200 MG tablet Take 1 tablet (200 mg total) by mouth 3 (three) times daily. 08/19/18   Lorin Picket, PA-C    Family History Family History  Problem Relation Age of Onset  . Peripheral vascular disease Mother   . Heart disease Mother   . COPD Mother   . Dementia Father   . ALS Brother   . Heart disease Brother        s/p CABG  . Breast cancer Cousin   . Breast cancer Cousin     Social History Social History  Tobacco Use  . Smoking status: Never Smoker  . Smokeless tobacco: Never Used  Substance Use Topics  . Alcohol use: No    Alcohol/week: 0.0 standard drinks  . Drug use: No     Allergies   Aspirin; Cephalexin; Codeine; Penicillins; and Sulfa antibiotics   Review of Systems Review of Systems  Constitutional: Negative for activity change, appetite change, chills, fatigue and fever.  Skin: Positive for wound.  All other systems reviewed and are negative.    Physical Exam Triage Vital Signs ED Triage Vitals  Enc Vitals Group     BP 01/18/19 1148 (!) 154/72     Pulse Rate 01/18/19 1148 69     Resp 01/18/19 1148 16     Temp 01/18/19 1148 97.9 F (36.6 C)     Temp Source 01/18/19 1148 Oral     SpO2 01/18/19 1148 99 %     Weight 01/18/19 1146 260 lb (117.9 kg)     Height 01/18/19 1146 '5\' 7"'$  (1.702 m)     Head Circumference --      Peak Flow --      Pain Score 01/18/19 1146 0     Pain Loc --      Pain Edu? --      Excl. in [place]? --    No data found.  Updated Vital Signs BP (!) 154/72 (BP Location: Right Arm)   Pulse 69   Temp 97.9 F (36.6 C) (Oral)   Resp 16   Ht '5\' 7"'$  (1.702 m)   Wt 260 lb (117.9 kg)   SpO2 99%   BMI 40.72 kg/m   Visual Acuity Right Eye Distance:   Left Eye Distance:     Bilateral Distance:    Right Eye Near:   Left Eye Near:    Bilateral Near:     Physical Exam Vitals signs and nursing note reviewed.  Constitutional:      General: She is not in acute distress.    Appearance: Normal appearance. She is obese. She is not ill-appearing, toxic-appearing or diaphoretic.  Skin:    Findings: Lesion present.     Comments: All wounds appear clean and dry there is no significant drainage.  There is no evidence of infection.  Neurological:     Mental Status: She is alert.      UC Treatments / Results  Labs (all labs ordered are listed, but only abnormal results are displayed) Labs Reviewed - No data to display  EKG None  Radiology No results found.  Procedures Procedures (including critical care time)  Medications Ordered in UC Medications - No data to display  Initial Impression / Assessment and Plan / UC Course  I have reviewed the triage vital signs and the nursing notes.  Pertinent labs & imaging results that were available during my care of the patient were reviewed by me and considered in my medical decision making (see chart for details).   Patient is doing well on the course of action that we have started.  We will continue  Bactroban ointment.  She will finish the Levaquin as prescribed by another physician.  He has any change has any signs or symptoms of increased infection she will return to our clinic immediately or go to the emergency room   Final Clinical Impressions(s) / UC Diagnoses   Final diagnoses:  Visit for wound check   Discharge Instructions   None    ED Prescriptions    None  Controlled Substance Prescriptions [place] Controlled Substance Registry consulted? Not Applicable   Lorin Picket, PA-C 01/18/19 2118

## 2019-01-18 NOTE — ED Triage Notes (Signed)
Patient states that she is here today for a wound check on her left leg. Patient states that area has been improving.

## 2019-02-26 ENCOUNTER — Other Ambulatory Visit: Payer: Self-pay | Admitting: Internal Medicine

## 2019-02-26 DIAGNOSIS — J309 Allergic rhinitis, unspecified: Secondary | ICD-10-CM

## 2019-03-09 ENCOUNTER — Other Ambulatory Visit: Payer: Self-pay | Admitting: Internal Medicine

## 2019-05-18 ENCOUNTER — Other Ambulatory Visit: Payer: Self-pay

## 2019-05-18 DIAGNOSIS — J309 Allergic rhinitis, unspecified: Secondary | ICD-10-CM

## 2019-05-18 MED ORDER — CETIRIZINE HCL 10 MG PO TABS: ORAL_TABLET | ORAL | 0 refills | Status: AC

## 2019-06-28 DIAGNOSIS — R945 Abnormal results of liver function studies: Secondary | ICD-10-CM | POA: Diagnosis not present

## 2019-06-28 DIAGNOSIS — J449 Chronic obstructive pulmonary disease, unspecified: Secondary | ICD-10-CM | POA: Diagnosis not present

## 2019-06-28 DIAGNOSIS — J4531 Mild persistent asthma with (acute) exacerbation: Secondary | ICD-10-CM | POA: Diagnosis not present

## 2019-06-28 DIAGNOSIS — E039 Hypothyroidism, unspecified: Secondary | ICD-10-CM | POA: Diagnosis not present

## 2019-06-28 DIAGNOSIS — K219 Gastro-esophageal reflux disease without esophagitis: Secondary | ICD-10-CM | POA: Diagnosis not present

## 2019-06-28 DIAGNOSIS — I10 Essential (primary) hypertension: Secondary | ICD-10-CM | POA: Diagnosis not present

## 2019-06-28 DIAGNOSIS — G4733 Obstructive sleep apnea (adult) (pediatric): Secondary | ICD-10-CM | POA: Diagnosis not present

## 2019-06-28 DIAGNOSIS — E139 Other specified diabetes mellitus without complications: Secondary | ICD-10-CM | POA: Diagnosis not present

## 2019-06-28 DIAGNOSIS — E785 Hyperlipidemia, unspecified: Secondary | ICD-10-CM | POA: Diagnosis not present

## 2019-07-10 ENCOUNTER — Ambulatory Visit
Admission: EM | Admit: 2019-07-10 | Discharge: 2019-07-10 | Disposition: A | Discharge status: Home / Self Care | Payer: Medicare Other | Attending: Emergency Medicine | Admitting: Emergency Medicine

## 2019-07-10 ENCOUNTER — Other Ambulatory Visit: Payer: Self-pay

## 2019-07-10 ENCOUNTER — Encounter: Payer: Self-pay | Admitting: Emergency Medicine

## 2019-07-10 DIAGNOSIS — N390 Urinary tract infection, site not specified: Secondary | ICD-10-CM | POA: Diagnosis not present

## 2019-07-10 DIAGNOSIS — R319 Hematuria, unspecified: Secondary | ICD-10-CM | POA: Insufficient documentation

## 2019-07-10 LAB — URINALYSIS, COMPLETE (UACMP) WITH MICROSCOPIC
Glucose, UA: NEGATIVE mg/dL
Ketones, ur: 15 mg/dL — AB
Nitrite: NEGATIVE
Protein, ur: 100 mg/dL — AB
Specific Gravity, Urine: >1.03 — ABNORMAL HIGH (ref 1.005–1.030)
WBC, UA: >50 WBC/hpf (ref 0–5)
pH: 5 (ref 5.0–8.0)

## 2019-07-10 MED ORDER — PHENAZOPYRIDINE HCL 200 MG PO TABS: 200.0000 mg | ORAL_TABLET | Freq: Three times a day (TID) | ORAL | 0 refills | Status: DC | PRN

## 2019-07-10 MED ORDER — FLUCONAZOLE 150 MG PO TABS: 150.0000 mg | ORAL_TABLET | Freq: Once | ORAL | 1 refills | Status: AC

## 2019-07-10 MED ORDER — CEPHALEXIN 500 MG PO CAPS: 500.0000 mg | ORAL_CAPSULE | Freq: Two times a day (BID) | ORAL | 0 refills | Status: DC

## 2019-07-10 NOTE — ED Triage Notes (Signed)
Patient c/o burning when urinating that started last night.

## 2019-07-10 NOTE — ED Provider Notes (Signed)
HPI  SUBJECTIVE:  Darlene Robbins is a 78 y.o. female who presents with UTI-like symptoms.  She reports dysuria, urinary frequency starting last night.  No urinary urgency, cloudy, odorous urine, hematuria.  No fevers, nausea, vomiting, body aches, abdominal, back, pelvic pain.  No vaginal bleeding, odor, rash, discharge, vaginal itching.  She has not been sexually active since her husband died 7 years ago.  No antibiotics in the past month.  No antipyretic in the past 4 to 6 hours.  She does not take bubble baths.  Symptoms are worse with urinating.  No alleviating factors.  She has not tried anything for this.  She has a past medical history of nephrolithiasis, UTI, diabetes, hypertension, vaginal yeast infections.  No history of pyelonephritis, chronic kidney disease, BV.  PMD: [facility], [facility]   Past Medical History:  Diagnosis Date  . Abdominal pain    Resolved,MRSA and yeast likely colonization in setting of multiple antibiotics.  . Arthritis    uses celebrex  . Benign essential HTN 03/02/2015  . Cancer ([place])    skin ca  . COPD (chronic obstructive pulmonary disease) ([place])    followed by Dr.Fleming  . Heart valve disease 10/17/2015  . Hypertension 07/30/2012  . Kidney stones    laser surgery in the past  . Leg varices 04/25/2014  . Sleep apnea    CPAP 7?  . Thyroid disease   . Varicose veins 09/05/2015  . Vitamin D deficiency 07-11-2010   drisdol 50,000 units 1 weekly x 12 weeks no refill active    Past Surgical History:  Procedure Laterality Date  . CATARACT EXTRACTION Bilateral    Dr. Rosana Berger  . laser surgery for kidney stones     followed by Dr.Humphries  . straighten nasal septum     Dr.McQueen  . TONSILLECTOMY    . VEIN SURGERY     Dr.Schnier    Family History  Problem Relation Age of Onset  . Peripheral vascular disease Mother   . Heart disease Mother   . COPD Mother   . Dementia Father   . ALS Brother   . Heart disease Brother        s/p CABG  .  Breast cancer Cousin   . Breast cancer Cousin     Social History   Tobacco Use  . Smoking status: Never Smoker  . Smokeless tobacco: Never Used  Substance Use Topics  . Alcohol use: No    Alcohol/week: 0.0 standard drinks  . Drug use: No    No current facility-administered medications for this encounter.   Current Outpatient Medications:  .  cetirizine (ZYRTEC) 10 MG tablet, TAKE 1 TABLET(10 MG) BY MOUTH DAILY, Disp: 90 tablet, Rfl: 0 .  furosemide (LASIX) 20 MG tablet, Take 1 tablet (20 mg total) by mouth daily as needed., Disp: 90 tablet, Rfl: 4 .  levothyroxine (SYNTHROID, LEVOTHROID) 75 MCG tablet, Take 1 tablet (75 mcg total) by mouth daily., Disp: 90 tablet, Rfl: 3 .  losartan (COZAAR) 25 MG tablet, Take 25 mg by mouth daily., Disp: , Rfl:  .  metFORMIN (GLUCOPHAGE) 500 MG tablet, Take 1 tablet (500 mg total) by mouth 2 (two) times daily with a meal., Disp: 180 tablet, Rfl: 3 .  pantoprazole (PROTONIX) 40 MG tablet, Take 1 tablet (40 mg total) by mouth 2 (two) times daily., Disp: 180 tablet, Rfl: 3 .  ACCU-CHEK FASTCLIX LANCETS MISC, 1 kit by Does not apply route 2 (two) times daily., Disp: 102 each,  Rfl: 12 .  albuterol (PROVENTIL) (2.5 MG/3ML) 0.083% nebulizer solution, USE 1 VIAL VIA NEBULIZER EVERY 6 HOURS AS NEEDED FOR WHEEZING OR SHORTNESS OF BREATH., Disp: 75 mL, Rfl: 0 .  albuterol (VENTOLIN HFA) 108 (90 Base) MCG/ACT inhaler, Inhale 2 puffs into the lungs every 4 (four) hours as needed., Disp: 1 Inhaler, Rfl: 6 .  Blood Glucose Calibration (ACCU-CHEK AVIVA) SOLN, 1 each by In Vitro route once., Disp: 1 each, Rfl: 2 .  Blood Glucose Monitoring Suppl (ONE TOUCH ULTRA SYSTEM KIT) W/DEVICE KIT, 1 kit by Does not apply route once. Use as directed. Test blood sugars 1-2 times daily., Disp: 1 each, Rfl: 0 .  cephALEXin (KEFLEX) 500 MG capsule, Take 1 capsule (500 mg total) by mouth 2 (two) times daily., Disp: 14 capsule, Rfl: 0 .  diclofenac sodium (VOLTAREN) 1 % GEL, Apply 1  application topically 2 (two) times daily as needed.  , Disp: , Rfl:  .  fluconazole (DIFLUCAN) 150 MG tablet, Take 1 tablet (150 mg total) by mouth once for 1 dose. 1 tab po x 1. May repeat in 72 hours if no improvement, Disp: 2 tablet, Rfl: 1 .  gentamicin ointment (GARAMYCIN) 0.1 %, Apply 1 application topically 3 (three) times daily. (Patient taking differently: Apply 1 application topically 3 (three) times daily as needed. ), Disp: 30 g, Rfl: 3 .  glipiZIDE (GLUCOTROL XL) 5 MG 24 hr tablet, Take by mouth., Disp: , Rfl:  .  glucose blood test strip, 1 each by Other route as needed for other. Use as instructed with One Touch Ultra Glucometer. Test blood sugars at home 1-2 times daily, Disp: 100 each, Rfl: 3 .  mupirocin ointment (BACTROBAN) 2 %, Apply 1 application topically 3 (three) times daily., Disp: 22 g, Rfl: 0 .  nystatin (MYCOSTATIN) 100000 UNIT/ML suspension, , Disp: , Rfl:  .  nystatin (MYCOSTATIN/NYSTOP) 100000 UNIT/GM POWD, Apply twice daily as needed, Disp: 60 g, Rfl: 3 .  ondansetron (ZOFRAN) 8 MG tablet, Take 1 tablet (8 mg total) by mouth every 8 (eight) hours as needed for nausea or vomiting., Disp: 30 tablet, Rfl: 3 .  phenazopyridine (PYRIDIUM) 200 MG tablet, Take 1 tablet (200 mg total) by mouth 3 (three) times daily as needed for pain., Disp: 6 tablet, Rfl: 0 .  prednisoLONE (ORAPRED ODT) 10 MG disintegrating tablet, Take 10 mg by mouth daily., Disp: , Rfl:  .  WIXELA INHUB 250-50 MCG/DOSE AEPB, INHALE 1 PUFF INTO THE LUNGS TWICE DAILY, Disp: 180 each, Rfl: 3  Allergies  Allergen Reactions  . Aspirin     Other reaction(s): Other (See Comments) History of gastritis/ulcers on EGD- pt advised to avoid aspirin  . Cephalexin Other (See Comments) and Rash  . Codeine Nausea And Vomiting    Violent vomiting  . Penicillins Rash  . Sulfa Antibiotics Rash     ROS  As noted in HPI.   Physical Exam  BP (!) 151/71 (BP Location: Right Arm)   Pulse 70   Temp 98.1 F (36.7  C) (Oral)   Resp 16   Ht '5\' 8"'$  (1.727 m)   Wt 113.4 kg   SpO2 100%   BMI 38.01 kg/m   Constitutional: Well developed, well nourished, no acute distress Eyes:  EOMI, conjunctiva normal bilaterally HENT: Normocephalic, atraumatic,mucus membranes moist Respiratory: Normal inspiratory effort Cardiovascular: Normal rate GI: nondistended soft, nontender.  No suprapubic, flank tenderness. Back: No CVAT skin: No rash, skin intact Musculoskeletal: no deformities Neurologic: Alert & oriented  x 3, no focal neuro deficits Psychiatric: Speech and behavior appropriate   ED Course   Medications - No data to display  Orders Placed This Encounter  Procedures  . Urine culture    Standing Status:   Standing    Number of Occurrences:   1    Order Specific Question:   Patient immune status    Answer:   Normal  . Urinalysis, Complete w Microscopic    Standing Status:   Standing    Number of Occurrences:   1    Results for orders placed or performed during the hospital encounter of 07/10/19 (from the past 24 hour(s))  Urinalysis, Complete w Microscopic     Status: Abnormal   Collection Time: 07/10/19  1:53 PM  Result Value Ref Range   Color, Urine YELLOW YELLOW   APPearance CLOUDY (A) CLEAR   Specific Gravity, Urine >1.030 (H) 1.005 - 1.030   pH 5.0 5.0 - 8.0   Glucose, UA NEGATIVE NEGATIVE mg/dL   Hgb urine dipstick MODERATE (A) NEGATIVE   Bilirubin Urine SMALL (A) NEGATIVE   Ketones, ur 15 (A) NEGATIVE mg/dL   Protein, ur 100 (A) NEGATIVE mg/dL   Nitrite NEGATIVE NEGATIVE   Leukocytes,Ua SMALL (A) NEGATIVE   Squamous Epithelial / LPF 11-20 0 - 5   WBC, UA >50 0 - 5 WBC/hpf   RBC / HPF 6-10 0 - 5 RBC/hpf   Bacteria, UA MANY (A) NONE SEEN   No results found.  ED Clinical Impression  1. Urinary tract infection with hematuria, site unspecified      ED Assessment/Plan  Patient with a contaminated sample however she does have small esterase and many bacteria.  Moderate blood  with some proteinuria and ketones.  Given her symptoms will treat as UTI and send off urine culture.  She will need to follow-up with her doctor in several days if she is not getting any better, go to the ER if she gets worse.  Previous urine culture reviewed.  Klebsiella pneumonia UTI last October that was resistant to Macrobid, sensitive to cephalosporins.  Patient denies rash or anaphylaxis with Keflex, states that she gets "yeast infections" with this.  We will try her on Keflex, send her home with some Pyridium and Diflucan in case she gets a yeast infection.  Discussed labs, MDM, treatment plan, and plan for follow-up with patient. Discussed sn/sx that should prompt return to the ED. patient agrees with plan.   Meds ordered this encounter  Medications  . cephALEXin (KEFLEX) 500 MG capsule    Sig: Take 1 capsule (500 mg total) by mouth 2 (two) times daily.    Dispense:  14 capsule    Refill:  0  . phenazopyridine (PYRIDIUM) 200 MG tablet    Sig: Take 1 tablet (200 mg total) by mouth 3 (three) times daily as needed for pain.    Dispense:  6 tablet    Refill:  0  . fluconazole (DIFLUCAN) 150 MG tablet    Sig: Take 1 tablet (150 mg total) by mouth once for 1 dose. 1 tab po x 1. May repeat in 72 hours if no improvement    Dispense:  2 tablet    Refill:  1    *This clinic note was created using [employer]. Therefore, there may be occasional mistakes despite careful proofreading.   ?    Melynda Ripple, MD 07/10/19 [phone]

## 2019-07-10 NOTE — Discharge Instructions (Addendum)
Go immediately to the emergency room for fevers above 100.4, an inability to keep fluids down, severe abdominal or back pain, blood in your urine, or for any other concerns.  The Keflex will treat urinary tract infection, the Pyridium will help with the pain when you urinate.  It will turn your urine orange.  I have given you a Diflucan in case you get a yeast infection that is not taking care of with yogurt.

## 2019-07-12 ENCOUNTER — Telehealth (HOSPITAL_COMMUNITY): Payer: Self-pay | Admitting: Emergency Medicine

## 2019-07-12 LAB — URINE CULTURE: Culture: >=100000 — AB

## 2019-07-12 NOTE — Telephone Encounter (Signed)
Urine culture was positive for e coli and was given keflex  at urgent care visit. Attempted to reach patient. No answer at this time.   

## 2019-07-16 IMAGING — MG MM DIGITAL SCREENING BILAT W/ CAD
4 series · 4 of 4 positions shown · non-contrast
Comparison: Previous exam(s).

CLINICAL DATA: Screening.

EXAM:
DIGITAL SCREENING BILATERAL MAMMOGRAM WITH CAD

[L CC]
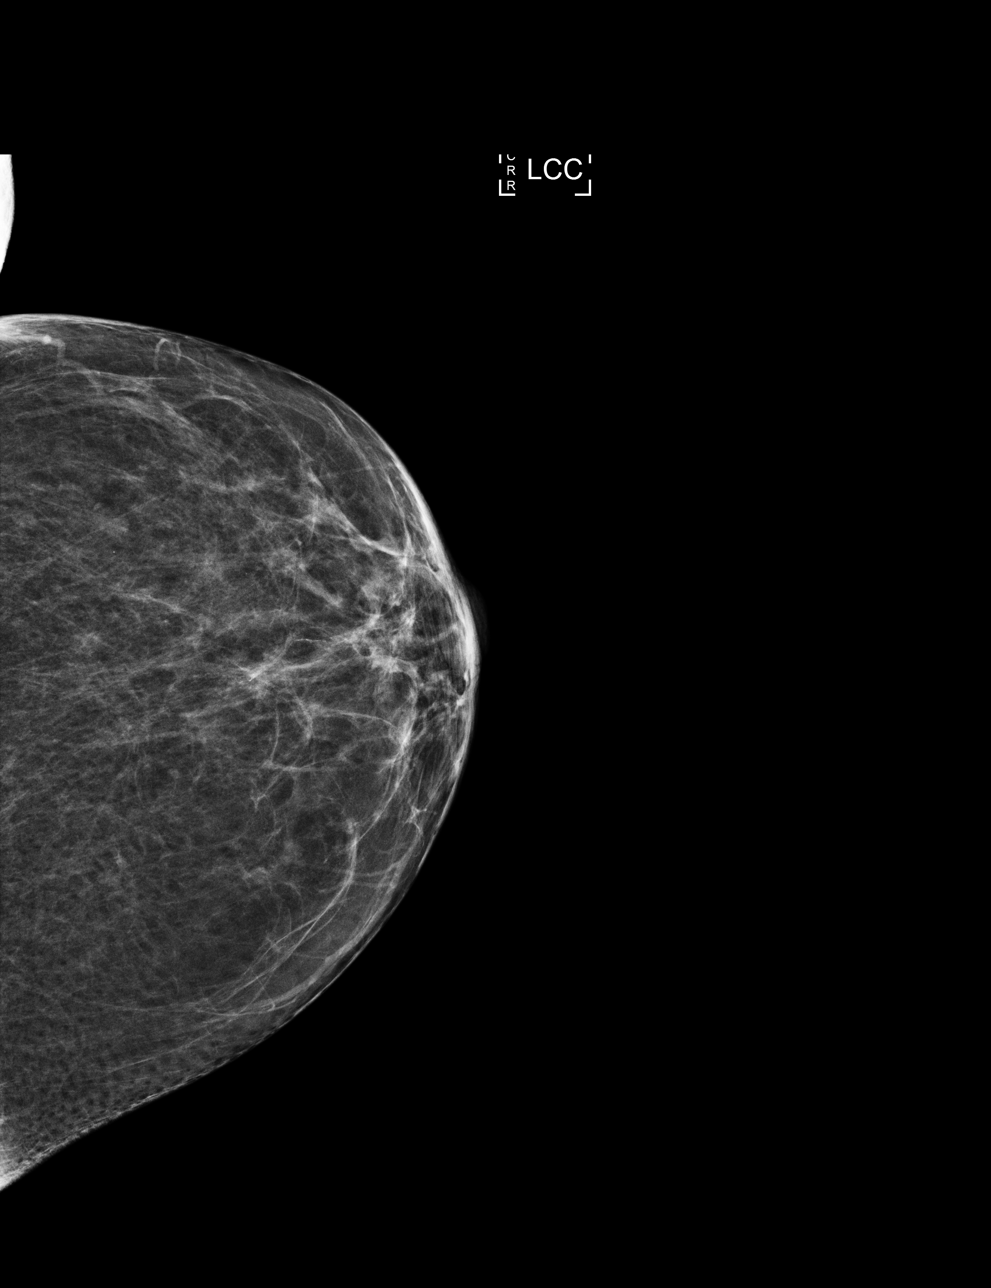

[R MLO]
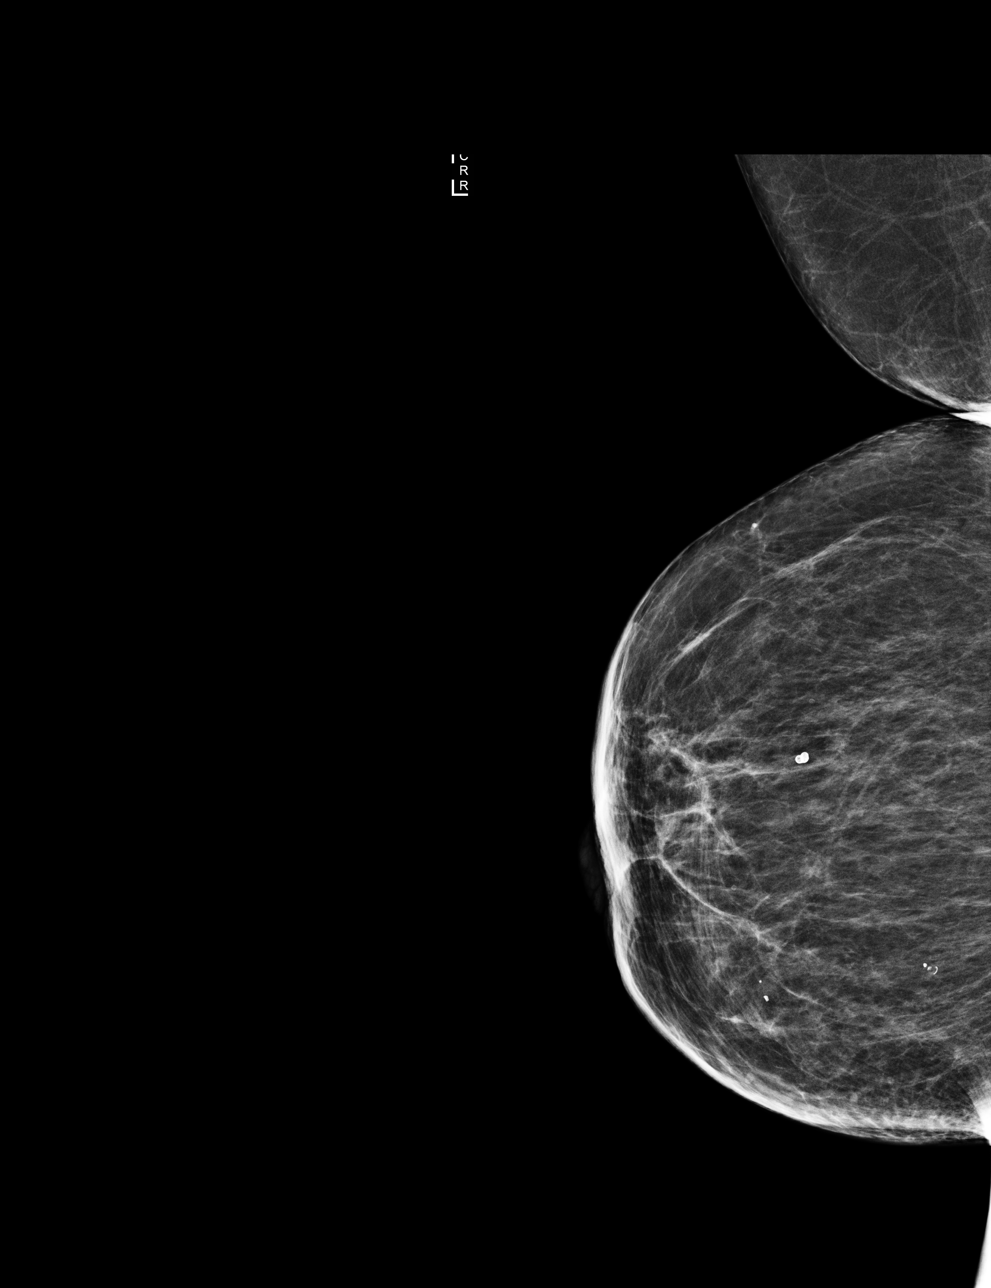

[R CC]
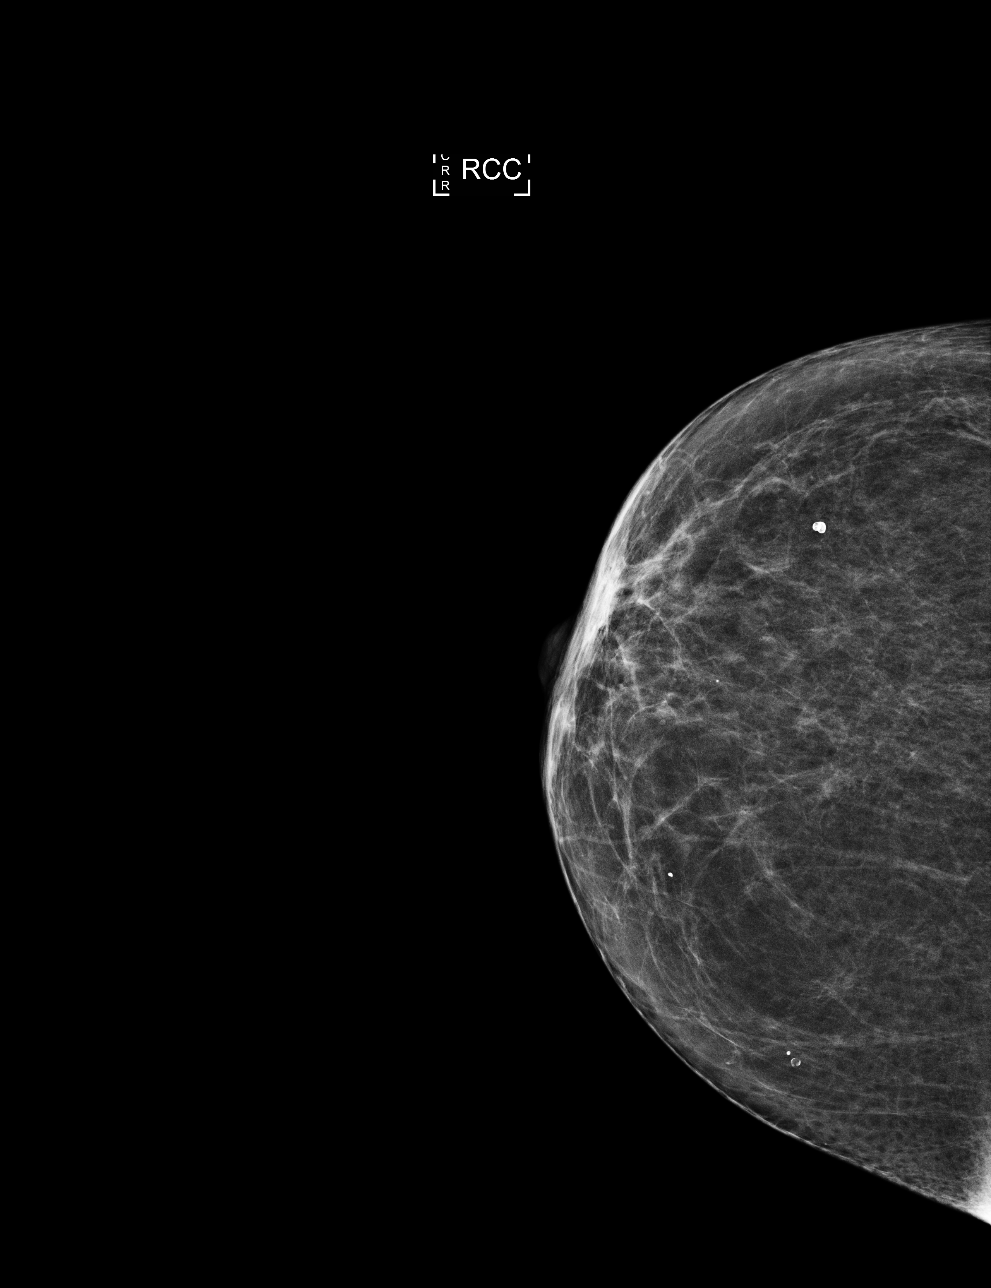

[L MLO]
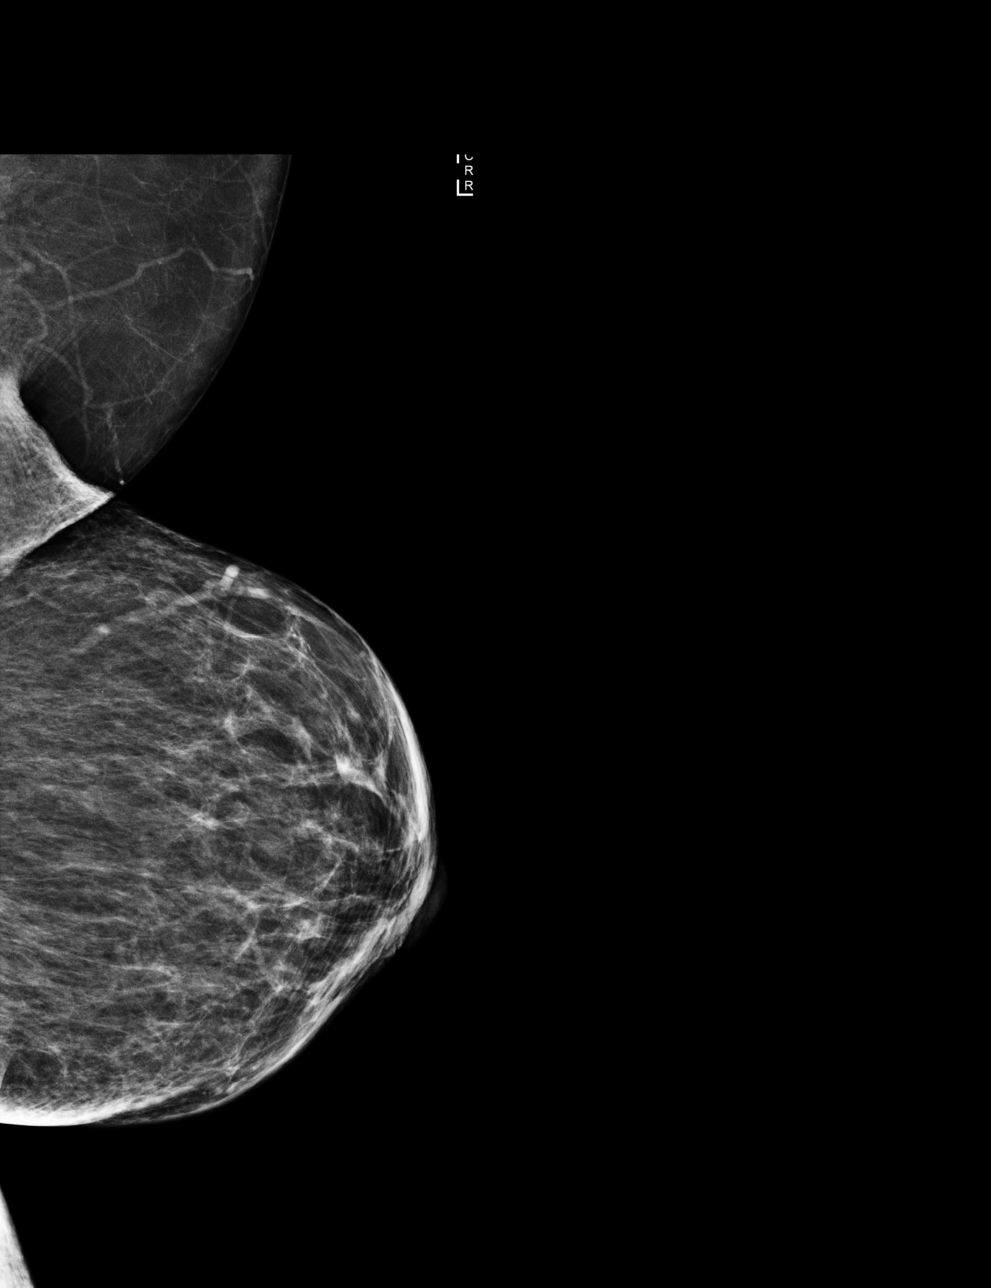

[4 of 4 positions shown; findings below may reference images not displayed]

ACR Breast Density Category b: There are scattered areas of
fibroglandular density.
FINDINGS: There are no findings suspicious for malignancy. Images were
processed with CAD.
IMPRESSION: No mammographic evidence of malignancy. A result letter of this
screening mammogram will be mailed directly to the patient.

RECOMMENDATION:
Screening mammogram in one year. (Code:AS-G-LCT)

BI-RADS CATEGORY  1: Negative.

## 2019-08-04 DIAGNOSIS — K760 Fatty (change of) liver, not elsewhere classified: Secondary | ICD-10-CM | POA: Diagnosis not present

## 2019-08-04 DIAGNOSIS — R945 Abnormal results of liver function studies: Secondary | ICD-10-CM | POA: Diagnosis not present

## 2019-08-04 DIAGNOSIS — R160 Hepatomegaly, not elsewhere classified: Secondary | ICD-10-CM | POA: Diagnosis not present

## 2019-08-24 DIAGNOSIS — H0100A Unspecified blepharitis right eye, upper and lower eyelids: Secondary | ICD-10-CM | POA: Diagnosis not present

## 2019-09-05 DIAGNOSIS — M25461 Effusion, right knee: Secondary | ICD-10-CM | POA: Diagnosis not present

## 2019-09-05 DIAGNOSIS — E669 Obesity, unspecified: Secondary | ICD-10-CM | POA: Diagnosis not present

## 2019-09-05 DIAGNOSIS — M1712 Unilateral primary osteoarthritis, left knee: Secondary | ICD-10-CM | POA: Diagnosis not present

## 2019-09-05 DIAGNOSIS — M25462 Effusion, left knee: Secondary | ICD-10-CM | POA: Diagnosis not present

## 2019-09-21 DIAGNOSIS — M1712 Unilateral primary osteoarthritis, left knee: Secondary | ICD-10-CM | POA: Diagnosis not present

## 2019-09-26 DIAGNOSIS — E119 Type 2 diabetes mellitus without complications: Secondary | ICD-10-CM | POA: Diagnosis not present

## 2019-10-27 ENCOUNTER — Ambulatory Visit
Admission: EM | Admit: 2019-10-27 | Discharge: 2019-10-27 | Disposition: A | Discharge status: Home / Self Care | Payer: Medicare Other | Attending: Family Medicine | Admitting: Family Medicine

## 2019-10-27 ENCOUNTER — Other Ambulatory Visit: Payer: Self-pay

## 2019-10-27 DIAGNOSIS — Z825 Family history of asthma and other chronic lower respiratory diseases: Secondary | ICD-10-CM | POA: Diagnosis not present

## 2019-10-27 DIAGNOSIS — Z880 Allergy status to penicillin: Secondary | ICD-10-CM | POA: Insufficient documentation

## 2019-10-27 DIAGNOSIS — J029 Acute pharyngitis, unspecified: Secondary | ICD-10-CM | POA: Insufficient documentation

## 2019-10-27 DIAGNOSIS — Z20828 Contact with and (suspected) exposure to other viral communicable diseases: Secondary | ICD-10-CM | POA: Insufficient documentation

## 2019-10-27 DIAGNOSIS — Z8249 Family history of ischemic heart disease and other diseases of the circulatory system: Secondary | ICD-10-CM | POA: Diagnosis not present

## 2019-10-27 DIAGNOSIS — H6593 Unspecified nonsuppurative otitis media, bilateral: Secondary | ICD-10-CM | POA: Insufficient documentation

## 2019-10-27 DIAGNOSIS — Z881 Allergy status to other antibiotic agents status: Secondary | ICD-10-CM | POA: Diagnosis not present

## 2019-10-27 DIAGNOSIS — Z885 Allergy status to narcotic agent status: Secondary | ICD-10-CM | POA: Diagnosis not present

## 2019-10-27 DIAGNOSIS — Z882 Allergy status to sulfonamides status: Secondary | ICD-10-CM | POA: Insufficient documentation

## 2019-10-27 DIAGNOSIS — R590 Localized enlarged lymph nodes: Secondary | ICD-10-CM | POA: Diagnosis not present

## 2019-10-27 DIAGNOSIS — Z886 Allergy status to analgesic agent status: Secondary | ICD-10-CM | POA: Insufficient documentation

## 2019-10-27 LAB — RAPID STREP SCREEN (MED CTR MEBANE ONLY): Streptococcus, Group A Screen (Direct): NEGATIVE

## 2019-10-27 MED ORDER — AZITHROMYCIN 250 MG PO TABS: 250.0000 mg | ORAL_TABLET | Freq: Every day | ORAL | 0 refills | Status: DC

## 2019-10-27 NOTE — Discharge Instructions (Addendum)
It was very nice seeing you today in clinic. Thank you for entrusting me with your care.   Rest and stay HYDRATED. Water and electrolyte containing beverages (Gatorade, Pedialyte) are best to prevent dehydration and electrolyte abnormalities.  Recommend warm salt water gargles, hard candies/lozenges, and hot tea with honey/lemon to help soothe the throat and reduce irritation.  May use Tylenol and/or Ibuprofen as needed for pain/fever.  Please utilize the medications that we discussed. Your prescriptions has been called in to your pharmacy.   You were tested for SARS-CoV-2 (novel coronavirus) today. Testing is performed by an outside lab ([facility]) and has variable turn around times ranging between 2-5 days. Current recommendations from the the [facility] and [facility] DHHS require that you remain  at home until negative test results are have been received. In the event that your test results are positive, you will be contacted with further directives. These measures are being implemented out of an abundance of caution to prevent transmission and spread during the current SARS-CoV-2 pandemic.  Make arrangements to follow up with your regular doctor in 1 week for re-evaluation if not improving. If your symptoms/condition worsens, please seek follow up care either here or in the ER. Please remember, our [facility] providers are "right here with you" when you need [place].   Again, it was my pleasure to take care of you today. Thank you for choosing our clinic. I hope that you start to feel better quickly.   Honor Loh, MSN, APRN, FNP-C, CEN Advanced Practice Provider [facility]

## 2019-10-27 NOTE — ED Triage Notes (Signed)
Pt. States she has had her sore throat for 2 weeks on & off, spradically. States her doctor will only see her with a COVID test.

## 2019-10-28 LAB — NOVEL CORONAVIRUS, NAA (HOSP ORDER, SEND-OUT TO REF LAB; TAT 18-24 HRS): SARS-CoV-2, NAA: NOT DETECTED

## 2019-10-28 NOTE — ED Provider Notes (Signed)
[place], [place]   Name: Rohnda Trimarchi DOB: May 12, 1941 MRN: [id number] CSN: [id number] PCP: [facility]  Arrival date and time:  10/27/19 1325  Chief Complaint:  Sore Throat   NOTE: Prior to seeing the patient today, I have reviewed the triage nursing documentation and vital signs. Clinical staff has updated patient's PMH/PSHx, current medication list, and drug allergies/intolerances to ensure comprehensive history available to assist in medical decision making.   History:   HPI: Javia Giliberto is a 78 y.o. female who presents today with complaints of sore throat that started approximately 2 weeks ago. Pain has waxed and waned. She has been hoarse. Patient denies associated fevers. She denies any cough and shortness of breath, however has had some minor wheezing. She denies that she has experienced any nausea, vomiting, diarrhea, or abdominal pain. She is eating and drinking well. Patient denies any perceived alterations to her sense of taste or smell. Patient denies being in close contact with anyone known to be ill; no one else is her home has experienced a similar symptom constellation. She has never been tested for SARS-CoV-2 (novel coronavirus) in the past per her report. Patient has not been vaccinated for influenza this season. Despite her symptoms, patient has not taken any over the counter interventions to help improve/relieve her reported symptoms at home. Patient has contacted her PCP in order to be seen, however she was advised that she could not be seen without testing negative for SARS-CoV-2.   Past Medical History:  Diagnosis Date  . Abdominal pain    Resolved,MRSA and yeast likely colonization in setting of multiple antibiotics.  . Arthritis    uses celebrex  . Benign essential HTN 03/02/2015  . Cancer ([place])    skin ca  . COPD (chronic obstructive pulmonary disease) ([place])    followed by Dr.Fleming  . Heart valve disease 10/17/2015  . Hypertension  07/30/2012  . Kidney stones    laser surgery in the past  . Leg varices 04/25/2014  . Sleep apnea    CPAP 7?  . Thyroid disease   . Varicose veins 09/05/2015  . Vitamin D deficiency 07-11-2010   drisdol 50,000 units 1 weekly x 12 weeks no refill active    Past Surgical History:  Procedure Laterality Date  . CATARACT EXTRACTION Bilateral    Dr. Rosana Berger  . laser surgery for kidney stones     followed by Dr.Humphries  . straighten nasal septum     Dr.McQueen  . TONSILLECTOMY    . VEIN SURGERY     Dr.Schnier    Family History  Problem Relation Age of Onset  . Peripheral vascular disease Mother   . Heart disease Mother   . COPD Mother   . Dementia Father   . ALS Brother   . Heart disease Brother        s/p CABG  . Breast cancer Cousin   . Breast cancer Cousin     Social History   Tobacco Use  . Smoking status: Never Smoker  . Smokeless tobacco: Never Used  Substance Use Topics  . Alcohol use: No    Alcohol/week: 0.0 standard drinks  . Drug use: No    Patient Active Problem List   Diagnosis Date Noted  . Secondary DM without complications ([place]) 123456  . 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 Nausea with vomiting 12/03/2015  . COPD exacerbation ([place]) 10/30/2015  . Thrush 10/30/2015  . Mild persistent asthma with acute exacerbation 10/27/2015  . Edema  leg 10/17/2015  . Heart valve disease 10/17/2015  . 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 Varicose veins 09/05/2015  . Abdominal wall cramping 09/05/2015  . Benign essential HTN 03/02/2015  . Obstructive apnea 08/24/2014  . Severe obesity (BMI >= 40) ([place]) 06/29/2014  . Leg varices 04/25/2014  . Microscopic hematuria 04/06/2014  . Diabetes type 2, controlled ([place]) 06/16/2013  . Medicare annual wellness visit, subsequent 06/16/2013  . COPD (chronic obstructive pulmonary disease) ([place]) 03/21/2013  . Obesity 03/11/2013  . Breath shortness 03/11/2013  . Calculus of kidney 08/04/2012  .  Urge incontinence of urine 08/04/2012  . Hypertension 07/30/2012  . Arthritis 09/04/2011  . Hypothyroidism 07/02/2011  . GERD (gastroesophageal reflux disease) 07/02/2011    Home Medications:    No outpatient medications have been marked as taking for the 10/27/19 encounter [facility] Encounter).    Allergies:   Aspirin, Cephalexin, Codeine, Penicillins, and Sulfa antibiotics  Review of Systems (ROS): Review of Systems  Constitutional: Negative for fatigue and fever.  HENT: Positive for sore throat and voice change. Negative for congestion, ear pain, postnasal drip, rhinorrhea, sinus pressure, sinus pain, sneezing and trouble swallowing.   Eyes: Negative for pain, discharge and redness.  Respiratory: Positive for wheezing. Negative for cough, chest tightness and shortness of breath.   Cardiovascular: Negative for chest pain and palpitations.  Gastrointestinal: Negative for abdominal pain, diarrhea, nausea and vomiting.  Musculoskeletal: Negative for arthralgias, back pain, myalgias and neck pain.  Skin: Negative for color change, pallor and rash.  Neurological: Negative for dizziness, syncope, weakness and headaches.  Hematological: Negative for adenopathy.     Vital Signs: Today's Vitals   10/27/19 1337 10/27/19 1339 10/27/19 1341  BP:   (!) 178/78  Pulse:   69  Resp:   20  Temp:   98 F (36.7 C)  TempSrc:   Oral  SpO2:   99%  Weight:  245 lb (111.1 kg)   PainSc: 3       Physical Exam: Physical Exam  Constitutional: She is oriented to person, place, and time and well-developed, well-nourished, and in no distress.  HENT:  Head: Normocephalic and atraumatic.  Right Ear: Hearing normal. Tympanic membrane is injected. Tympanic membrane is not bulging. A middle ear effusion is present.  Left Ear: Hearing normal. Tympanic membrane is injected. Tympanic membrane is not bulging. A middle ear effusion is present.  Nose: Nose normal.  Mouth/Throat: Uvula is midline and mucous  membranes are normal. Posterior oropharyngeal edema (mild) and posterior oropharyngeal erythema present. No oropharyngeal exudate.  No dysphagia; able to handle oral secretions and thin liquids. No SOB, stridor, or wheezing. Phonation hoarse.   Eyes: Pupils are equal, round, and reactive to light.  Neck: No tracheal deviation present.  Cardiovascular: Normal rate, regular rhythm, normal heart sounds and intact distal pulses.  Pulmonary/Chest: Effort normal. She has wheezes (faint expiratory; scattered).  Musculoskeletal:     Cervical back: Normal range of motion and neck supple.  Lymphadenopathy:       Head (right side): Submandibular adenopathy present.  Neurological: She is alert and oriented to person, place, and time. Gait normal.  Skin: Skin is warm and dry. No rash noted. She is not diaphoretic.  Psychiatric: Mood, memory, affect and judgment normal.  Nursing note and vitals reviewed.   Urgent Care Treatments / Results:   Orders Placed This Encounter  Procedures  . Novel Coronavirus, NAA (Hosp order, Send-out to Ref Lab; TAT 18-24 hrs  . Rapid Strep  Screen ([facility] ONLY)  . Culture, group A strep    LABS: PLEASE NOTE: all labs that were ordered this encounter are listed, however only abnormal results are displayed. Labs Reviewed  NOVEL CORONAVIRUS, NAA (HOSP ORDER, SEND-OUT TO REF LAB; TAT 18-24 HRS)  RAPID STREP SCREEN ([facility] ONLY)  CULTURE, GROUP A STREP [facility])    EKG: -None  RADIOLOGY: No results found.  PROCEDURES: Procedures  MEDICATIONS RECEIVED THIS VISIT: Medications - No data to display  PERTINENT CLINICAL COURSE NOTES/UPDATES:   Initial Impression / Assessment and Plan / Urgent Care Course:  Pertinent labs & imaging results that were available during my care of the patient were personally reviewed by me and considered in my medical decision making (see lab/imaging section of note for values and interpretations).  Paysley Volkmer  is a 78 y.o. female who [facility] to [facility] today with complaints of Sore Throat   Patient overall well appearing and in no acute distress today in clinic. Presenting symptoms (see HPI) and exam as documented above. She presents with symptoms associated with SARS-CoV-2 (novel coronavirus). Discussed typical symptom constellation. Reviewed potential for infection and need for testing. Patient amenable to being tested. SARS-CoV-2 swab collected by certified clinical staff. Discussed variable turn around times associated with testing, as swabs are being processed at [facility], and have been taking between 2-5 days to come back. She was advised to self quarantine, per [facility] DHHS guidelines, until negative results received. These measures are being implemented out of an abundance of caution to prevent transmission and spread during the current SARS-CoV-2 pandemic.  Rapid streptococcal throat swab (-); reflex culture sent.  Discussed that, until ruled out with confirmatory lab testing, SARS-CoV-2 remains part of the differential. Her testing is pending at this time. Given exam and chronicity of symptoms, suspect bacterial etiology; ?? NGAS. Patient allergic to PCN and cephalosporins. Will pursue treatment using macrolide therapy. Prescription sent in for a 5 day course of azithromycin. Discussed supportive care measures at home during acute phase of illness. Patient to rest as much as possible. She was encouraged to ensure adequate hydration (water and ORS) to prevent dehydration and electrolyte derangements. Patient may use APAP and/or IBU on an as needed basis for pain/fever. Recommended warm salt water gargles, hard candies/lozenges, and hot tea with honey/lemon to help soothe the throat and reduce irritation.   Discussed follow up with primary care physician in 1 week for re-evaluation. I have reviewed the follow up and strict return precautions for any new or worsening symptoms. Patient is aware of  symptoms that would be deemed urgent/emergent, and would thus require further evaluation either here or in the emergency department. At the time of discharge, she verbalized understanding and consent with the discharge plan as it was reviewed with her. All questions were fielded by provider and/or clinic staff prior to patient discharge.    Final Clinical Impressions / Urgent Care Diagnoses:   Final diagnoses:  Acute pharyngitis, unspecified etiology  Fluid level behind tympanic membrane of both ears  LAD (lymphadenopathy), submandibular    New Prescriptions:  [place] Controlled Substance Registry consulted? Not Applicable  Meds ordered this encounter  Medications  . azithromycin (ZITHROMAX) 250 MG tablet    Sig: Take 1 tablet (250 mg total) by mouth daily. Take first 2 tablets together, then 1 every day until finished.    Dispense:  6 tablet    Refill:  0    Recommended Follow up Care:  Patient  encouraged to follow up with the following provider within the specified time frame, or sooner as dictated by the severity of her symptoms. As always, she was instructed that for any urgent/emergent care needs, she should seek care either here or in the emergency department for more immediate evaluation.  Follow-up Information    [facility] In 1 week.   Specialty: Family Medicine Why: General reassessment of symptoms if not improving Contact information: [address] [place] [address] [phone]         NOTE: This note was prepared using Dragon dictation software along with smaller phrase technology. Despite my best ability to proofread, there is the potential that transcriptional errors may still occur from this process, and are completely unintentional.    Karen Kitchens, NP 10/28/19 2109

## 2019-10-30 LAB — CULTURE, GROUP A STREP (THRC)

## 2019-12-06 ENCOUNTER — Other Ambulatory Visit: Payer: Self-pay | Admitting: Specialist

## 2019-12-06 DIAGNOSIS — Z1231 Encounter for screening mammogram for malignant neoplasm of breast: Secondary | ICD-10-CM

## 2020-01-02 ENCOUNTER — Other Ambulatory Visit: Payer: Self-pay | Admitting: Unknown Physician Specialty

## 2020-01-03 ENCOUNTER — Other Ambulatory Visit: Payer: Self-pay | Admitting: Unknown Physician Specialty

## 2020-01-03 DIAGNOSIS — R1310 Dysphagia, unspecified: Secondary | ICD-10-CM

## 2020-01-03 DIAGNOSIS — K219 Gastro-esophageal reflux disease without esophagitis: Secondary | ICD-10-CM

## 2020-01-10 ENCOUNTER — Other Ambulatory Visit: Payer: Self-pay

## 2020-01-10 ENCOUNTER — Ambulatory Visit
Admission: RE | Admit: 2020-01-10 | Discharge: 2020-01-10 | Disposition: A | Discharge status: Home / Self Care | Payer: Medicare Other | Source: Ambulatory Visit | Attending: Unknown Physician Specialty | Admitting: Unknown Physician Specialty

## 2020-01-10 DIAGNOSIS — R1314 Dysphagia, pharyngoesophageal phase: Secondary | ICD-10-CM | POA: Diagnosis present

## 2020-01-10 DIAGNOSIS — R1310 Dysphagia, unspecified: Secondary | ICD-10-CM | POA: Insufficient documentation

## 2020-01-10 DIAGNOSIS — K219 Gastro-esophageal reflux disease without esophagitis: Secondary | ICD-10-CM | POA: Insufficient documentation

## 2020-01-10 NOTE — Therapy (Addendum)
[place], [place], [address] Phone: [phone]   Fax:     Modified Barium Swallow  Patient Details  Name: Darlene Robbins MRN: [id number] Date of Birth: 01/17/1941 No data recorded  Encounter Date: 01/10/2020  End of Session - 01/10/20 1534    Visit Number  1    Number of Visits  1    Date for SLP Re-Evaluation  01/10/20    SLP Start Time  66    SLP Stop Time   1400    SLP Time Calculation (min)  60 min    Activity Tolerance  Patient tolerated treatment well       Past Medical History:  Diagnosis Date  . Abdominal pain    Resolved,MRSA and yeast likely colonization in setting of multiple antibiotics.  . Arthritis    uses celebrex  . Benign essential HTN 03/02/2015  . Cancer ([place])    skin ca  . COPD (chronic obstructive pulmonary disease) ([place])    followed by Dr.Fleming  . Heart valve disease 10/17/2015  . Hypertension 07/30/2012  . Kidney stones    laser surgery in the past  . Leg varices 04/25/2014  . Sleep apnea    CPAP 7?  . Thyroid disease   . Varicose veins 09/05/2015  . Vitamin D deficiency 07-11-2010   drisdol 50,000 units 1 weekly x 12 weeks no refill active    Past Surgical History:  Procedure Laterality Date  . CATARACT EXTRACTION Bilateral    Dr. Rosana Berger  . laser surgery for kidney stones     followed by Dr.Humphries  . straighten nasal septum     Dr.McQueen  . TONSILLECTOMY    . VEIN SURGERY     Dr.Schnier    There were no vitals filed for this visit.      Subjective: Patient behavior: (alertness, ability to follow instructions, etc.): pt endorsed difficulty w/ "choking on my own saliva" both during the daytime and nighttime -- no consistency or particular frequency, but she stated it "feels like it happens more often now". She did not endorse any difficulty swallowing foods/liquids though she stated she had "gotten choked on chicken breast, french  fries, and biscuits". She stated she has Not changed her diet recently nor lost weight. She endorsed s/s of dry mouth and often chew gum to help. Pt has a Baseline of [id number]) and Bell's Palsy affecting Left facial/labial tone(from several years ago"). No report of Neurological event or pneumonia in chart notes. Pt was recently Dx'd and treated for Thrush per ENT note Feb 2021. OM exam: grossly WFL w/ No lingual weakness or asymmetry. Slight asymmetry of Left facial/labial corner of mouth; eye(impact from Bell's Palsy per pt). Cough strong. Missing back molars. Chief complaint: dysphagia   Objective:  Radiological Procedure: A videoflouroscopic evaluation of oral-preparatory, reflex initiation, and pharyngeal phases of the swallow was performed; as well as a screening of the upper esophageal phase.  I. POSTURE: upright II. VIEW: lateral III. COMPENSATORY STRATEGIES: f/u, Dry swallow to aid bolus clearing IV. BOLUSES ADMINISTERED:  Thin Liquid: 6 trials  Nectar-thick Liquid: 1 trial  Honey-thick Liquid: NT  Puree: 3 trials  Mechanical Soft: 1 trial V. RESULTS OF EVALUATION: A. ORAL PREPARATORY PHASE: (The lips, tongue, and velum are observed for strength and coordination)       **Overall Severity Rating: WFL. Adequate oral phase bolus control; timely A-P transfer of boluses for swallow. No premature spillage/loss noted. Mastication  of solids appeared appropriate w/ rotary movements. Oral clearing achieved w/ all trials. Pt is missing back molars.   B. SWALLOW INITIATION/REFLEX: (The reflex is normal if "triggered" by the time the bolus reached the base of the tongue)  **Overall Severity Rating: [facility]. Timely pharyngeal swallow initiation w/ all trial consistencies. Trigger of the pharyngeal swallow appeared to occur b/t the BOT and Valleculae w/ all consistencies. NO aspiration or laryngeal penetration occurred during trials.  C. PHARYNGEAL PHASE: (Pharyngeal function is normal if the bolus  shows rapid, smooth, and continuous transit through the pharynx and there is no pharyngeal residue after the swallow)  **Overall Severity Rating: gorssly WFL. Pt exhibited slight+, diffuse BOT-Valleculae residue post initial swallow and followed w/ a 2nd, DRY swallow which cleared any bolus residue remaining.   D. LARYNGEAL PENETRATION: (Material entering into the laryngeal inlet/vestibule but not aspirated): NONE E. ASPIRATION: NONE F. ESOPHAGEAL PHASE: (Screening of the upper esophagus): noted apparent prominent cricopharyngeus muscle w/ beginnings of a CP bar -- both posterior and anterior protrusion noted. Bolus trials given at this evaluation cleared the UES and Cervical Esophagus but suspect increased textured foods such as Meats could pose dysmotility risk.    ASSESSMENT: Pt appears to present w/ adequate oropharyngeal phase swallowing function w/ No gross oropharyngeal phase dysphagia noted at this evaluation. No Neuromuscular deficits apparent. Pt does exhibit an apparent prominent Cricopharyngeus muscle w/ beginnings of a CP bar -- both posterior and anterior protrusion noted. Bolus trials given at this evaluation cleared the UES and Cervical Esophagus, but suspect increased textured foods such as Meats could pose dysmotility risk. Could suspect impact of Thrush in the oral cavity/BOT and/or Reflux irritation on the lingual/pharyngeal tissue thus impacting pharyngeal sensation even of increased saliva in the throat which seemed to be her main complaint("choking on my own saliva"). Educated pt on being aware of need to Dry swallow extra times during the day to help minimize, clear saliva. Pt is aware she needs to eat less dry, bulky meats such as "baked chicken" and thick breads such as "biscuit" for less discomfort.  Oral phase was [facility]. Adequate oral phase bolus control noted w/ timely A-P transfer of boluses for swallow. No premature spillage/loss noted. Mastication of solids appeared appropriate  w/ rotary movement. Oral clearing achieved w/ all trials. Pt is missing back molars for most effective mastication success. Pharyngeal phase was grossly WFL. Timely pharyngeal swallow initiation noted w/ all bolus consistencies. Triggering of the pharyngeal swallow appeared to occur b/t the BOT and Valleculae w/ all consistencies. NO aspiration or laryngeal penetration occurred during trials. Pt exhibited slight+, diffuse BOT-Valleculae residue post initial swallow, however, this was cleared completely w/ a 2nd, DRY swallow which pt appeared to do independently. Unsure if this is the effect of long-term Reflux impacting pharyngeal tissue; this could also explain possible pooling of saliva which pt complains "chokes" her intermittently.  Pt was educated on using a f/u, Dry swallows during meals AND more frequently during the day in order to clear any pooling saliva. Recommended sleeping w/ HOB elevated to 30 degrees; general Reflux precautions. Discussed general aspiration precautions including small bites/sips, slowly at meals.    PLAN/RECOMMENDATIONS:  A. Diet: regular-mech soft meats diet w/ thin liquids -- avoiding problematic foods such as tough chicken, breads. Recommend moistening foods well.  B. Swallowing Precautions: general aspiration precautions to include Pills Whole in Puree if easier for swallowing(pt is already doing this w/ larger pills in banana); GERD/REFLUX precautions Baseline.   C.  Recommended consultation to: continue f/u for GERD management w/ GI; Dry mouth/increased Saliva issues/concerns w/ ENT  D. Therapy recommendations: None at this time  E. Results and recommendations were discussed w/ pt, video viewed and questions answered. Discussed options for foods; moistening foods well to include soups at meals; keeping ice chips handy for dry mouth.            Dysphagia, pharyngoesophageal phase  Dysphagia, unspecified type - Plan: DG SWALLOW FUNC OP MEDICARE SPEECH PATH, DG  SWALLOW FUNC OP MEDICARE SPEECH PATH  Gastroesophageal reflux disease without esophagitis - Plan: DG SWALLOW FUNC OP MEDICARE SPEECH PATH, DG SWALLOW FUNC OP MEDICARE SPEECH PATH        Problem List Patient Active Problem List   Diagnosis Date Noted  . Secondary DM without complications ([place]) 123456  . 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 Nausea with vomiting 12/03/2015  . COPD exacerbation ([place]) 10/30/2015  . Thrush 10/30/2015  . Mild persistent asthma with acute exacerbation 10/27/2015  . Edema leg 10/17/2015  . Heart valve disease 10/17/2015  . 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 Varicose veins 09/05/2015  . Abdominal wall cramping 09/05/2015  . Benign essential HTN 03/02/2015  . Obstructive apnea 08/24/2014  . Severe obesity (BMI >= 40) ([place]) 06/29/2014  . Leg varices 04/25/2014  . Microscopic hematuria 04/06/2014  . Diabetes type 2, controlled ([place]) 06/16/2013  . Medicare annual wellness visit, subsequent 06/16/2013  . COPD (chronic obstructive pulmonary disease) ([place]) 03/21/2013  . Obesity 03/11/2013  . Breath shortness 03/11/2013  . Calculus of kidney 08/04/2012  . Urge incontinence of urine 08/04/2012  . Hypertension 07/30/2012  . Arthritis 09/04/2011  . Hypothyroidism 07/02/2011  . GERD (gastroesophageal reflux disease) 07/02/2011       Orinda Kenner, MS, CCC-SLP Imajean Mcdermid 01/10/2020, 3:35 PM  [facility] [place], [place], [address] Phone: [phone]   Fax:     Name: Darlene Robbins MRN: [id number] Date of Birth: June 22, 1941

## 2020-05-01 ENCOUNTER — Other Ambulatory Visit: Payer: Self-pay

## 2020-05-01 ENCOUNTER — Ambulatory Visit
Admission: RE | Admit: 2020-05-01 | Discharge: 2020-05-01 | Disposition: A | Discharge status: Home / Self Care | Payer: Medicare Other | Source: Ambulatory Visit | Attending: Specialist | Admitting: Specialist

## 2020-05-01 DIAGNOSIS — Z1231 Encounter for screening mammogram for malignant neoplasm of breast: Secondary | ICD-10-CM | POA: Insufficient documentation

## 2020-07-13 ENCOUNTER — Other Ambulatory Visit: Payer: Self-pay

## 2020-07-13 ENCOUNTER — Ambulatory Visit
Admission: EM | Admit: 2020-07-13 | Discharge: 2020-07-13 | Disposition: A | Discharge status: Home / Self Care | Payer: Medicare Other | Attending: Emergency Medicine | Admitting: Emergency Medicine

## 2020-07-13 DIAGNOSIS — Z0189 Encounter for other specified special examinations: Secondary | ICD-10-CM | POA: Diagnosis not present

## 2020-07-13 DIAGNOSIS — Z20822 Contact with and (suspected) exposure to covid-19: Secondary | ICD-10-CM | POA: Insufficient documentation

## 2020-07-13 NOTE — ED Triage Notes (Signed)
Patient here for COVID test.  Patient denies any symptoms.

## 2020-07-13 NOTE — Discharge Instructions (Signed)

## 2020-07-14 LAB — SARS CORONAVIRUS 2 (TAT 6-24 HRS): SARS Coronavirus 2: NEGATIVE

## 2020-08-16 ENCOUNTER — Other Ambulatory Visit: Payer: Self-pay

## 2020-08-16 ENCOUNTER — Ambulatory Visit
Admission: EM | Admit: 2020-08-16 | Discharge: 2020-08-16 | Disposition: A | Discharge status: Home / Self Care | Payer: Medicare Other | Attending: Internal Medicine | Admitting: Internal Medicine

## 2020-08-16 ENCOUNTER — Encounter: Payer: Self-pay | Admitting: Emergency Medicine

## 2020-08-16 ENCOUNTER — Ambulatory Visit (INDEPENDENT_AMBULATORY_CARE_PROVIDER_SITE_OTHER): Payer: Medicare Other

## 2020-08-16 DIAGNOSIS — Z20822 Contact with and (suspected) exposure to covid-19: Secondary | ICD-10-CM

## 2020-08-16 DIAGNOSIS — R059 Cough, unspecified: Secondary | ICD-10-CM

## 2020-08-16 DIAGNOSIS — R5383 Other fatigue: Secondary | ICD-10-CM | POA: Diagnosis not present

## 2020-08-16 DIAGNOSIS — R062 Wheezing: Secondary | ICD-10-CM | POA: Insufficient documentation

## 2020-08-16 HISTORY — DX: Unspecified asthma, uncomplicated: J45.909

## 2020-08-16 LAB — SARS CORONAVIRUS 2 (TAT 6-24 HRS): SARS Coronavirus 2: POSITIVE — AB

## 2020-08-16 NOTE — ED Provider Notes (Signed)
[facility]    CSN: [id number] Arrival date & time: 08/16/20  0807      History   Chief Complaint Chief Complaint  Patient presents with  . Covid Exposure  . Cough  . Diarrhea    HPI Darlene Robbins is a 79 y.o. female who developed cough and diarrhea 2 nights ago, also having ST and rhinits. Denies body aches more than her usual aches. Chest tightness hit her yesterday. She only had diarrhea x 1 yesterday, and none today. Has been having night sweats. Denies more SOB than her usual from her asthma. Was treated for bronchitis 9/24 with Levaquin and prednisone. Her cough had resolved before this new sickness. Since yesterday she has been easily fatigued with activity. Has had bilateral lower leg swelling for the past week, but is getting better. She also states she had full cardiology work up with nl stress test and echo one year ago. Her son tested positive for covid this week.  She had covid injections this year.     Past Medical History:  Diagnosis Date  . Abdominal pain    Resolved,MRSA and yeast likely colonization in setting of multiple antibiotics.  . Arthritis    uses celebrex  . Asthma   . Benign essential HTN 03/02/2015  . Cancer ([place])    skin ca  . COPD (chronic obstructive pulmonary disease) ([place])    followed by Dr.Fleming  . Heart valve disease 10/17/2015  . Hypertension 07/30/2012  . Kidney stones    laser surgery in the past  . Leg varices 04/25/2014  . Sleep apnea    CPAP 7?  . Thyroid disease   . Varicose veins 09/05/2015  . Vitamin D deficiency 07-11-2010   drisdol 50,000 units 1 weekly x 12 weeks no refill active    Patient Active Problem List   Diagnosis Date Noted  . Secondary DM without complications ([place]) 00/17/4944  . 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 Nausea with vomiting 12/03/2015  . COPD exacerbation ([place]) 10/30/2015  . Thrush 10/30/2015  . Mild persistent asthma with acute exacerbation 10/27/2015  . Edema leg 10/17/2015    . Heart valve disease 10/17/2015  . 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 Varicose veins 09/05/2015  . Abdominal wall cramping 09/05/2015  . Benign essential HTN 03/02/2015  . Obstructive apnea 08/24/2014  . Severe obesity (BMI >= 40) ([place]) 06/29/2014  . Leg varices 04/25/2014  . Microscopic hematuria 04/06/2014  . Diabetes type 2, controlled ([place]) 06/16/2013  . Medicare annual wellness visit, subsequent 06/16/2013  . COPD (chronic obstructive pulmonary disease) ([place]) 03/21/2013  . Obesity 03/11/2013  . Breath shortness 03/11/2013  . Calculus of kidney 08/04/2012  . Urge incontinence of urine 08/04/2012  . Hypertension 07/30/2012  . Arthritis 09/04/2011  . Hypothyroidism 07/02/2011  . GERD (gastroesophageal reflux disease) 07/02/2011    Past Surgical History:  Procedure Laterality Date  . CATARACT EXTRACTION Bilateral    Dr. Rosana Berger  . laser surgery for kidney stones     followed by Dr.Humphries  . straighten nasal septum     Dr.McQueen  . TONSILLECTOMY    . VEIN SURGERY     Dr.Schnier    OB History   No obstetric history on file.      Home Medications    Prior to Admission medications   Medication Sig Start Date End Date Taking? Authorizing Provider  ACCU-CHEK FASTCLIX LANCETS MISC 1 kit by Does not apply route 2 (two) times daily. 01/30/16  [name], MD  albuterol (PROVENTIL) (2.5 MG/3ML) 0.083% nebulizer solution USE 1 VIAL VIA NEBULIZER EVERY 6 HOURS AS NEEDED FOR WHEEZING OR SHORTNESS OF BREATH. 07/26/18   Flora Lipps, MD  albuterol (VENTOLIN HFA) 108 (90 Base) MCG/ACT inhaler Inhale 2 puffs into the lungs every 4 (four) hours as needed. 07/23/18   Flora Lipps, MD  Blood Glucose Calibration (ACCU-CHEK AVIVA) SOLN 1 each by In Vitro route once. 05/17/13   [name], MD  Blood Glucose Monitoring Suppl (ONE TOUCH ULTRA SYSTEM KIT) W/DEVICE KIT 1 kit by Does not apply route once. Use as directed. Test blood sugars 1-2  times daily. 02/05/15   [name], MD  cetirizine (ZYRTEC) 10 MG tablet TAKE 1 TABLET(10 MG) BY MOUTH DAILY 05/18/19   Flora Lipps, MD  diclofenac sodium (VOLTAREN) 1 % GEL Apply 1 application topically 2 (two) times daily as needed.      [provider]  furosemide (LASIX) 20 MG tablet Take 1 tablet (20 mg total) by mouth daily as needed. 09/06/12   [name], MD  gentamicin ointment (GARAMYCIN) 0.1 % Apply 1 application topically 3 (three) times daily. Patient taking differently: Apply 1 application topically 3 (three) times daily as needed.  08/22/15   [name], MD  glipiZIDE (GLUCOTROL XL) 5 MG 24 hr tablet Take by mouth. 01/11/18   [provider]  glucose blood test strip 1 each by Other route as needed for other. Use as instructed with One Touch Ultra Glucometer. Test blood sugars at home 1-2 times daily 02/05/15   [name], MD  levothyroxine (SYNTHROID, LEVOTHROID) 75 MCG tablet Take 1 tablet (75 mcg total) by mouth daily. 05/14/16   [name], MD  losartan (COZAAR) 25 MG tablet Take 25 mg by mouth daily. 03/13/17 07/10/19  [provider]  metFORMIN (GLUCOPHAGE) 500 MG tablet Take 1 tablet (500 mg total) by mouth 2 (two) times daily with a meal. 05/14/16 07/10/19  [name], MD  nystatin (MYCOSTATIN) 100000 UNIT/ML suspension  10/29/15   [provider]  nystatin (MYCOSTATIN/NYSTOP) 100000 UNIT/GM POWD Apply twice daily as needed 09/19/15   [name], MD  ondansetron (ZOFRAN) 8 MG tablet Take 1 tablet (8 mg total) by mouth every 8 (eight) hours as needed for nausea or vomiting. 12/03/15   [name], MD  pantoprazole (PROTONIX) 40 MG tablet Take 1 tablet (40 mg total) by mouth 2 (two) times daily. 05/14/16 07/10/19  [name], MD  prednisoLONE (ORAPRED ODT) 10 MG disintegrating tablet Take 10 mg by mouth daily.    [provider]  Grant Ruts INHUB 250-50 MCG/DOSE AEPB INHALE 1 PUFF INTO  THE LUNGS TWICE DAILY 03/09/19   Flora Lipps, MD    Family History Family History  Problem Relation Age of Onset  . Peripheral vascular disease Mother   . Heart disease Mother   . COPD Mother   . Dementia Father   . ALS Brother   . Heart disease Brother        s/p CABG  . Breast cancer Cousin   . Breast cancer Cousin     Social History Social History   Tobacco Use  . Smoking status: Never Smoker  . Smokeless tobacco: Never Used  Vaping Use  . Vaping Use: Never used  Substance Use Topics  . Alcohol use: No    Alcohol/week: 0.0 standard drinks  . Drug use: No     Allergies  Aspirin, Cephalexin, Codeine, Penicillins, and Sulfa antibiotics   Review of Systems Review of Systems  Constitutional: Positive for diaphoresis and fatigue. Negative for appetite change, chills and fever.  HENT: Positive for rhinorrhea and sore throat. Negative for trouble swallowing.   Eyes: Negative for discharge.  Respiratory: Positive for cough, chest tightness and wheezing. Negative for shortness of breath.   Cardiovascular: Negative for chest pain.  Gastrointestinal: Positive for diarrhea. Negative for abdominal pain, nausea and vomiting.  Endocrine:       Her glucose have been up to 200 since on the predniosone  Skin: Negative for rash.  Neurological: Negative for headaches.  Hematological: Negative for adenopathy.     Physical Exam Triage Vital Signs ED Triage Vitals  Enc Vitals Group     BP 08/16/20 0826 (!) 153/83     Pulse Rate 08/16/20 0826 80     Resp 08/16/20 0826 18     Temp 08/16/20 0826 98.3 F (36.8 C)     Temp Source 08/16/20 0826 Oral     SpO2 08/16/20 0826 97 %     Weight --      Height --      Head Circumference --      Peak Flow --      Pain Score 08/16/20 0818 0     Pain Loc --      Pain Edu? --      Excl. in [place]? --    No data found.  Updated Vital Signs BP (!) 153/83 (BP Location: Left Arm)   Pulse 80   Temp 98.3 F (36.8 C) (Oral)   Resp 18    SpO2 97%   Visual Acuity Right Eye Distance:   Left Eye Distance:   Bilateral Distance:    Right Eye Near:   Left Eye Near:    Bilateral Near:     Physical Exam Physical Exam Vitals signs and nursing note reviewed.  Constitutional:      General: She is not in acute distress.    Appearance: Normal appearance. She is not ill-appearing, toxic-appearing or diaphoretic.  HENT:     Head: Normocephalic.     Right Ear: Tympanic membrane, ear canal and external ear normal.     Left Ear: Tympanic membrane, ear canal and external ear normal.     Nose: Nose normal.     Mouth/Throat: with mild erythema and no exudate. Uvula is mid line    Mouth: Mucous membranes are moist.  Eyes:     General: No scleral icterus.       Right eye: No discharge.        Left eye: No discharge.     Conjunctiva/sclera: Conjunctivae normal.  Neck:     Musculoskeletal: Neck supple. No neck rigidity.  Cardiovascular:     Rate and Rhythm: Normal rate and regular rhythm.     Heart sounds: No murmur. +1/4 pitting edema of ankles.  Pulmonary:     Effort: Pulmonary effort is normal.     Breath sounds: Normal breath sounds.  Abdominal:     General: Bowel sounds are normal. There is no distension.     Palpations: Abdomen is soft. There is no mass.     Tenderness: There is no abdominal tenderness. There is no guarding or rebound.     Hernia: No hernia is present.  Musculoskeletal: Normal range of motion.  Lymphadenopathy:     Cervical: No cervical adenopathy.  Skin:    General: Skin is warm and  dry.     Coloration: Skin is not jaundiced.     Findings: No rash.  Neurological:     Mental Status: She is alert and oriented to person, place, and time.     Gait: Gait normal.  Psychiatric:        Mood and Affect: Mood normal.        Behavior: Behavior normal.        Thought Content: Thought content normal.        Judgment: Judgment normal.   UC Treatments / Results  Labs (all labs ordered are listed, but only  abnormal results are displayed) Labs Reviewed  SARS CORONAVIRUS 2 (TAT 6-24 HRS)    EKG NSR, normal EKG  Radiology DG Chest 2 View  Result Date: 08/16/2020 CLINICAL DATA:  Cough and fatigue EXAM: CHEST - 2 VIEW COMPARISON:  10/26/2015 FINDINGS: The heart size and mediastinal contours are within normal limits. Aortic atherosclerotic calcifications noted. Both lungs are clear. Mild thoracic spondylosis. IMPRESSION: No active cardiopulmonary disease. Electronically Signed   By: Kerby Moors M.D.   On: 08/16/2020 09:06    Procedures Procedures (including critical care time)  Medications Ordered in UC Medications - No data to display  Initial Impression / Assessment and Plan / UC Course  I have reviewed the triage vital signs and the nursing notes. Covid test is pending. She is interested in the MAB infusion and will check with [facility] since she is taking her son there for infusion if they are willing to give it to her without the test results being  Back. She was hesitant about getting labs drawn since she is a hard stick and has to head home to take her son to [facility].  See instructions.  Pertinent labs & imaging results that were available during my care of the patient were reviewed by me and considered in my medical decision making (see chart for details).   Final Clinical Impressions(s) / UC Diagnoses   Final diagnoses:  Exposure to confirmed case of COVID-19  Wheezing     Discharge Instructions     Due to your chronic conditions and if your covid test is positive, you qualify to receive Monoclonal Antibody infusion and if you don't hear from anyone within 24 hours of finding your results, call [phone] If your Covid test ends up positive you may take the following supplements to help your immune system be stronger to fight this viral infection Take Quarcetin 500 mg three times a day x 7 days with Zinc 50 mg ones a day x 7 days. The quarcetin is an antiviral and  anti-inflammatory supplement which helps open the zinc channels in the cell to absorb Zinc. Zinc helps decrease the virus load in your body. Take Melatonin 6-10 mg at bed time which also helps support your immune system.  Also make sure to take Vit D 5,000 IU per day with a fatty meal and Vit C 1000 mg a day until you are completely better. Stay on Vitamin D 2,000  and C the rest of the season.  Don't lay around, keep active and walk as much as you are able to to prevent worsening of your symptoms.  Follow up with your family Dr next week.  If you get short of breath and you are able to check  your oxygen with a pulse oxygen meter, if it gets to 92% or less, you need to go to the hospital to be admitted. If you dont have one, come  back here and we will assess you.      ED Prescriptions    None     PDMP not reviewed this encounter.   Shelby Mattocks, [place] 08/16/20 [phone]

## 2020-08-16 NOTE — ED Notes (Signed)
Called xray

## 2020-08-16 NOTE — ED Triage Notes (Signed)
Pt c/o covid exposure in her home, her son tested positive on Sunday. She has had a cough, diarrhea, and chest tightness. Pt states she has a hx of asthma. She would like to be tested. Pt is wheezing during triage.

## 2020-08-16 NOTE — Discharge Instructions (Addendum)
Due to your chronic conditions and if your covid test is positive, you qualify to receive Monoclonal Antibody infusion and if you don't hear from anyone within 24 hours of finding your results, call [phone] If your Covid test ends up positive you may take the following supplements to help your immune system be stronger to fight this viral infection Take Quarcetin 500 mg three times a day x 7 days with Zinc 50 mg ones a day x 7 days. The quarcetin is an antiviral and anti-inflammatory supplement which helps open the zinc channels in the cell to absorb Zinc. Zinc helps decrease the virus load in your body. Take Melatonin 6-10 mg at bed time which also helps support your immune system.  Also make sure to take Vit D 5,000 IU per day with a fatty meal and Vit C 1000 mg a day until you are completely better. Stay on Vitamin D 2,000  and C the rest of the season.  Don't lay around, keep active and walk as much as you are able to to prevent worsening of your symptoms.  Follow up with your family Dr next week.  If you get short of breath and you are able to check  your oxygen with a pulse oxygen meter, if it gets to 92% or less, you need to go to the hospital to be admitted. If you dont have one, come back here and we will assess you.   

## 2020-10-19 ENCOUNTER — Ambulatory Visit (INDEPENDENT_AMBULATORY_CARE_PROVIDER_SITE_OTHER): Payer: Medicare Other

## 2020-10-19 ENCOUNTER — Other Ambulatory Visit: Payer: Self-pay

## 2020-10-19 ENCOUNTER — Ambulatory Visit
Admission: EM | Admit: 2020-10-19 | Discharge: 2020-10-19 | Disposition: A | Discharge status: Home / Self Care | Payer: Medicare Other | Attending: Emergency Medicine | Admitting: Emergency Medicine

## 2020-10-19 ENCOUNTER — Encounter: Payer: Self-pay | Admitting: Emergency Medicine

## 2020-10-19 DIAGNOSIS — L089 Local infection of the skin and subcutaneous tissue, unspecified: Secondary | ICD-10-CM

## 2020-10-19 DIAGNOSIS — M7989 Other specified soft tissue disorders: Secondary | ICD-10-CM | POA: Diagnosis not present

## 2020-10-19 DIAGNOSIS — R609 Edema, unspecified: Secondary | ICD-10-CM

## 2020-10-19 DIAGNOSIS — T148XXA Other injury of unspecified body region, initial encounter: Secondary | ICD-10-CM | POA: Diagnosis not present

## 2020-10-19 DIAGNOSIS — S8012XA Contusion of left lower leg, initial encounter: Secondary | ICD-10-CM | POA: Diagnosis not present

## 2020-10-19 DIAGNOSIS — R030 Elevated blood-pressure reading, without diagnosis of hypertension: Secondary | ICD-10-CM

## 2020-10-19 MED ORDER — DOXYCYCLINE HYCLATE 100 MG PO CAPS: 100.0000 mg | ORAL_CAPSULE | Freq: Two times a day (BID) | ORAL | 0 refills | Status: DC

## 2020-10-19 NOTE — ED Triage Notes (Signed)
Patient states that she slipped on the ladder step and injured her left lower leg on Saturday.  Patient c/o tenderness, redness and swelling at the site.

## 2020-10-19 NOTE — Discharge Instructions (Addendum)
Cleanse daily with soap and water.  Complete course of antibiotics.  Please take your daily blood pressure medication.  I do feel that a few days of lasix will help with your swelling.  Please follow up with your PCP for recheck of your BP.

## 2020-10-19 NOTE — ED Provider Notes (Signed)
[facility]    CSN: [id number] Arrival date & time: 10/19/20  1020      History   Chief Complaint Chief Complaint  Patient presents with  . Leg Injury    left    HPI Darlene Robbins is a 79 y.o. female.   Darlene Robbins presents with complaints of pain and swelling to left lower leg. 6 days ago she was attempting to climb a ladder and her foot slipped, causing her left lower leg to strike the step. Wound to anterior lower leg. Pain, redness and swelling have been worsening since. No drainage. No fevers. She is diabetic. History of HTN, states she takes her BP medication at night and did take it last night.      Past Medical History:  Diagnosis Date  . Abdominal pain    Resolved,MRSA and yeast likely colonization in setting of multiple antibiotics.  . Arthritis    uses celebrex  . Asthma   . Benign essential HTN 03/02/2015  . Cancer ([place])    skin ca  . COPD (chronic obstructive pulmonary disease) ([place])    followed by Dr.Fleming  . Heart valve disease 10/17/2015  . Hypertension 07/30/2012  . Kidney stones    laser surgery in the past  . Leg varices 04/25/2014  . Sleep apnea    CPAP 7?  . Thyroid disease   . Varicose veins 09/05/2015  . Vitamin D deficiency 07-11-2010   drisdol 50,000 units 1 weekly x 12 weeks no refill active    Patient Active Problem List   Diagnosis Date Noted  . Secondary DM without complications ([place]) 22/48/2500  . 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 Nausea with vomiting 12/03/2015  . COPD exacerbation ([place]) 10/30/2015  . Thrush 10/30/2015  . Mild persistent asthma with acute exacerbation 10/27/2015  . Edema leg 10/17/2015  . Heart valve disease 10/17/2015  . 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 Varicose veins 09/05/2015  . Abdominal wall cramping 09/05/2015  . Benign essential HTN 03/02/2015  . Obstructive apnea 08/24/2014  . Severe obesity (BMI >= 40) ([place]) 06/29/2014  . Leg varices  04/25/2014  . Microscopic hematuria 04/06/2014  . Diabetes type 2, controlled ([place]) 06/16/2013  . Medicare annual wellness visit, subsequent 06/16/2013  . COPD (chronic obstructive pulmonary disease) ([place]) 03/21/2013  . Obesity 03/11/2013  . Breath shortness 03/11/2013  . Calculus of kidney 08/04/2012  . Urge incontinence of urine 08/04/2012  . Hypertension 07/30/2012  . Arthritis 09/04/2011  . Hypothyroidism 07/02/2011  . GERD (gastroesophageal reflux disease) 07/02/2011    Past Surgical History:  Procedure Laterality Date  . CATARACT EXTRACTION Bilateral    Dr. Rosana Berger  . laser surgery for kidney stones     followed by Dr.Humphries  . straighten nasal septum     Dr.McQueen  . TONSILLECTOMY    . VEIN SURGERY     Dr.Schnier    OB History   No obstetric history on file.      Home Medications    Prior to Admission medications   Medication Sig Start Date End Date Taking? Authorizing Provider  albuterol (VENTOLIN HFA) 108 (90 Base) MCG/ACT inhaler Inhale 2 puffs into the lungs every 4 (four) hours as needed. 07/23/18  Yes Flora Lipps, MD  furosemide (LASIX) 20 MG tablet Take 1 tablet (20 mg total) by mouth daily as needed. 09/06/12  Yes [name], MD  glipiZIDE (GLUCOTROL XL) 5 MG 24 hr tablet Take by mouth. 01/11/18  Yes  [provider]  levothyroxine (SYNTHROID, LEVOTHROID) 75 MCG tablet Take 1 tablet (75 mcg total) by mouth daily. 05/14/16  Yes [name], MD  losartan (COZAAR) 25 MG tablet Take 25 mg by mouth daily. 03/13/17 10/19/20 Yes [provider]  metFORMIN (GLUCOPHAGE) 500 MG tablet Take 1 tablet (500 mg total) by mouth 2 (two) times daily with a meal. 05/14/16 07/10/19 Yes [name], MD  pantoprazole (PROTONIX) 40 MG tablet Take 1 tablet (40 mg total) by mouth 2 (two) times daily. 05/14/16 07/10/19 Yes [name], MD  ACCU-CHEK FASTCLIX LANCETS MISC 1 kit by Does not apply route 2 (two) times daily. 01/30/16   [name], MD  albuterol (PROVENTIL) (2.5 MG/3ML) 0.083% nebulizer solution USE 1 VIAL VIA NEBULIZER EVERY 6 HOURS AS NEEDED FOR WHEEZING OR SHORTNESS OF BREATH. 07/26/18   Flora Lipps, MD  Blood Glucose Calibration (ACCU-CHEK AVIVA) SOLN 1 each by In Vitro route once. 05/17/13   [name], MD  Blood Glucose Monitoring Suppl (ONE TOUCH ULTRA SYSTEM KIT) W/DEVICE KIT 1 kit by Does not apply route once. Use as directed. Test blood sugars 1-2 times daily. 02/05/15   [name], MD  cetirizine (ZYRTEC) 10 MG tablet TAKE 1 TABLET(10 MG) BY MOUTH DAILY 05/18/19   Flora Lipps, MD  diclofenac sodium (VOLTAREN) 1 % GEL Apply 1 application topically 2 (two) times daily as needed.      [provider]  doxycycline (VIBRAMYCIN) 100 MG capsule Take 1 capsule (100 mg total) by mouth 2 (two) times daily. 10/19/20   Zigmund Gottron, NP  gentamicin ointment (GARAMYCIN) 0.1 % Apply 1 application topically 3 (three) times daily. Patient taking differently: Apply 1 application topically 3 (three) times daily as needed.  08/22/15   [name], MD  glucose blood test strip 1 each by Other route as needed for other. Use as instructed with One Touch Ultra Glucometer. Test blood sugars at home 1-2 times daily 02/05/15   [name], MD  nystatin (MYCOSTATIN) 100000 UNIT/ML suspension  10/29/15   [provider]  nystatin (MYCOSTATIN/NYSTOP) 100000 UNIT/GM POWD Apply twice daily as needed 09/19/15   [name], MD  ondansetron (ZOFRAN) 8 MG tablet Take 1 tablet (8 mg total) by mouth every 8 (eight) hours as needed for nausea or vomiting. 12/03/15   [name], MD  prednisoLONE (ORAPRED ODT) 10 MG disintegrating tablet Take 10 mg by mouth daily.    [provider]  Grant Ruts INHUB 250-50 MCG/DOSE AEPB INHALE 1 PUFF INTO THE LUNGS TWICE DAILY 03/09/19   Flora Lipps, MD    Family History Family History  Problem Relation Age of Onset  . Peripheral vascular  disease Mother   . Heart disease Mother   . COPD Mother   . Dementia Father   . ALS Brother   . Heart disease Brother        s/p CABG  . Breast cancer Cousin   . Breast cancer Cousin     Social History Social History   Tobacco Use  . Smoking status: Never Smoker  . Smokeless tobacco: Never Used  Vaping Use  . Vaping Use: Never used  Substance Use Topics  . Alcohol use: No    Alcohol/week: 0.0 standard drinks  . Drug use: No     Allergies   Aspirin, Cephalexin, Codeine, Penicillins, and Sulfa antibiotics   Review of Systems Review of Systems   Physical Exam Triage Vital  Signs ED Triage Vitals  Enc Vitals Group     BP 10/19/20 1117 (S) (!) 191/82     Pulse Rate 10/19/20 1117 76     Resp 10/19/20 1117 16     Temp 10/19/20 1117 98 F (36.7 C)     Temp Source 10/19/20 1117 Oral     SpO2 10/19/20 1117 100 %     Weight 10/19/20 1114 240 lb 1.3 oz (108.9 kg)     Height --      Head Circumference --      Peak Flow --      Pain Score 10/19/20 1114 8     Pain Loc --      Pain Edu? --      Excl. in [place]? --    No data found.  Updated Vital Signs BP (S) (!) 191/82 (BP Location: Left Arm)   Pulse 76   Temp 98 F (36.7 C) (Oral)   Resp 16   Wt 240 lb 1.3 oz (108.9 kg)   SpO2 100%   BMI 36.50 kg/m   Visual Acuity Right Eye Distance:   Left Eye Distance:   Bilateral Distance:    Right Eye Near:   Left Eye Near:    Bilateral Near:     Physical Exam Constitutional:      General: She is not in acute distress.    Appearance: She is well-developed.  Cardiovascular:     Rate and Rhythm: Normal rate.  Pulmonary:     Effort: Pulmonary effort is normal.  Musculoskeletal:     Right lower leg: 2+ Pitting Edema present.     Left lower leg: Laceration, tenderness and bony tenderness present. 2+ Pitting Edema present.       Legs:     Comments: Healing wound with scab to left anterior shin, approximately 1 cm in length; with surrounding redness and tenderness;  no drainage   Skin:    General: Skin is warm and dry.  Neurological:     Mental Status: She is alert and oriented to person, place, and time.      UC Treatments / Results  Labs (all labs ordered are listed, but only abnormal results are displayed) Labs Reviewed - No data to display  EKG   Radiology DG Tibia/Fibula Left  Result Date: 10/19/2020 CLINICAL DATA:  Contusion. Patient slipped on a ladder injuring the left lower leg. Redness and swelling at the injury site. EXAM: LEFT TIBIA AND FIBULA - 2 VIEW COMPARISON:  None. FINDINGS: No fracture or bone lesion. Knee and ankle joints are normally aligned. There is diffuse subcutaneous soft tissue edema. No radiopaque foreign body. No soft tissue air. IMPRESSION: 1. No fracture or dislocation. 2. Soft tissue swelling. No radiopaque foreign body or soft tissue air. Electronically Signed   By: Lajean Manes M.D.   On: 10/19/2020 11:50    Procedures Procedures (including critical care time)  Medications Ordered in UC Medications - No data to display  Initial Impression / Assessment and Plan / UC Course  I have reviewed the triage vital signs and the nursing notes.  Pertinent labs & imaging results that were available during my care of the patient were reviewed by me and considered in my medical decision making (see chart for details).    Concern for wound infection/ cellulitis with doxycycline initiated in this diabetic patient. Noted hypertension and pitting edema. Encouraged use of her prescribed lasix. Patient states that she does check her BP at home, and  today's reading is unusual for her. No chest pain , no shortness of breath today. She is agreeable to monitoring her BP at home with return precautions. Encouraged follow up with her PCP for recheck of her BP. Patient verbalized understanding and agreeable to plan.   Final Clinical Impressions(s) / UC Diagnoses   Final diagnoses:  Post-traumatic wound infection  Peripheral  edema  Elevated blood pressure reading     Discharge Instructions     Cleanse daily with soap and water.  Complete course of antibiotics.  Please take your daily blood pressure medication.  I do feel that a few days of lasix will help with your swelling.  Please follow up with your PCP for recheck of your BP.     ED Prescriptions    Medication Sig Dispense Auth. Provider   doxycycline (VIBRAMYCIN) 100 MG capsule Take 1 capsule (100 mg total) by mouth 2 (two) times daily. 20 capsule Zigmund Gottron, NP     PDMP not reviewed this encounter.   Zigmund Gottron, NP 10/19/20 [phone]

## 2023-10-05 ENCOUNTER — Ambulatory Visit: Payer: Medicare Other

## 2023-10-05 ENCOUNTER — Ambulatory Visit
Admission: RE | Admit: 2023-10-05 | Discharge: 2023-10-05 | Disposition: A | Discharge status: Home / Self Care | Payer: Medicare Other | Source: Ambulatory Visit | Attending: Internal Medicine | Admitting: Internal Medicine

## 2023-10-05 VITALS — BP 181/76 | HR 71 | Temp 98.1°F | Resp 19

## 2023-10-05 DIAGNOSIS — M79675 Pain in left toe(s): Secondary | ICD-10-CM | POA: Diagnosis not present

## 2023-10-05 DIAGNOSIS — N3000 Acute cystitis without hematuria: Secondary | ICD-10-CM | POA: Diagnosis not present

## 2023-10-05 LAB — URINALYSIS, W/ REFLEX TO CULTURE (INFECTION SUSPECTED)
Bilirubin Urine: NEGATIVE
Glucose, UA: NEGATIVE mg/dL
Hgb urine dipstick: NEGATIVE
Ketones, ur: NEGATIVE mg/dL
Nitrite: NEGATIVE
Protein, ur: NEGATIVE mg/dL
Specific Gravity, Urine: 1.02 (ref 1.005–1.030)
pH: 5.5 (ref 5.0–8.0)

## 2023-10-05 MED ORDER — DOXYCYCLINE HYCLATE 100 MG PO CAPS: 100.0000 mg | ORAL_CAPSULE | Freq: Two times a day (BID) | ORAL | 0 refills | Status: AC

## 2023-10-05 NOTE — ED Triage Notes (Signed)
Patient states that she was putting her pj's bottoms on and missed the leg and hit her left big toe. Happened Sat night. She buddy taped her toes and took it off this morning.   UTI sx x burning when she's finishing voiding.

## 2023-10-05 NOTE — Discharge Instructions (Signed)
The clinic will contact you with results of the urine culture done today if positive.  Start doxycycline twice daily for 7 days.  May keep your left third and fourth toes buddy taped together for support.  Elevate, ice and take over-the-counter Tylenol or ibuprofen as needed.  Please follow-up with your PCP if your symptoms do not improve.  Please go to the ER for any worsening symptoms.  I hope you feel better soon!

## 2023-10-05 NOTE — ED Provider Notes (Addendum)
[facility]    CSN: [id number] Arrival date & time: 10/05/23  1434      History   Chief Complaint Chief Complaint  Patient presents with   Toe Pain   burning with urination    HPI Darlene Robbins is a 82 y.o. female presents for dysuria and toe pain.  Patient reports 1 week of urinary frequency, burning.  Denies hematuria, urgency, fevers, nausea/vomiting, flank pain.  Does report history of chronic UTIs and does see urology.  She did a home test and states it was positive.  In addition she reports 2 days ago while putting on her pajama bottoms she hit her left third and fourth toe on the bed.  Since then she has been having pain and swelling that is intermittent.  No numbness or tingling.  No history of injuries or surgeries to the left toes or feet.  She has been taking Tylenol OTC for symptoms.  No other concerns at this time.   Toe Pain    Past Medical History:  Diagnosis Date   Abdominal pain    Resolved,MRSA and yeast likely colonization in setting of multiple antibiotics.   Arthritis    uses celebrex   Asthma    Benign essential HTN 03/02/2015   Cancer ([facility])    skin ca   COPD (chronic obstructive pulmonary disease) ([facility])    followed by Dr.Fleming   Heart valve disease 10/17/2015   Hypertension 07/30/2012   Kidney stones    laser surgery in the past   Leg varices 04/25/2014   Sleep apnea    CPAP 7?   Thyroid disease    Varicose veins 09/05/2015   Vitamin D deficiency 07-11-2010   drisdol 50,000 units 1 weekly x 12 weeks no refill active    Patient Active Problem List   Diagnosis Date Noted   Secondary DM without complications ([facility]) 01/14/2017   Central obesity 03/24/2016   Nausea with vomiting 12/03/2015   COPD exacerbation ([facility]) 10/30/2015   Thrush 10/30/2015   Mild persistent asthma with acute exacerbation 10/27/2015   Edema leg 10/17/2015   Heart valve disease 10/17/2015   Combined fat and carbohydrate induced hyperlipemia 09/25/2015    Varicose veins 09/05/2015   Abdominal wall cramping 09/05/2015   Benign essential HTN 03/02/2015   Obstructive apnea 08/24/2014   Severe obesity (BMI >= 40) ([facility]) 06/29/2014   Leg varices 04/25/2014   Microscopic hematuria 04/06/2014   Diabetes type 2, controlled ([facility]) 06/16/2013   Medicare annual wellness visit, subsequent 06/16/2013   COPD (chronic obstructive pulmonary disease) ([facility]) 03/21/2013   Obesity 03/11/2013   Breath shortness 03/11/2013   Calculus of kidney 08/04/2012   Urge incontinence of urine 08/04/2012   Hypertension 07/30/2012   Arthritis 09/04/2011   Hypothyroidism 07/02/2011   GERD (gastroesophageal reflux disease) 07/02/2011    Past Surgical History:  Procedure Laterality Date   CATARACT EXTRACTION Bilateral    Dr. Regino Bellow   laser surgery for kidney stones     followed by Dr.Humphries   straighten nasal septum     Dr.McQueen   TONSILLECTOMY     VEIN SURGERY     Dr.Schnier    OB History   No obstetric history on file.      Home Medications    Prior to Admission medications   Medication Sig Start Date End Date Taking? Authorizing Provider  ACCU-CHEK FASTCLIX LANCETS MISC 1 kit by Does not apply route 2 (two) times daily. 01/30/16  Yes Shelia Media,  MD  amLODipine (NORVASC) 5 MG tablet Take 1 tablet by mouth daily. 10/16/22 10/16/23 Yes [provider]  BREO ELLIPTA 100-25 MCG/ACT AEPB Inhale into the lungs. 09/09/23  Yes [provider]  doxycycline (VIBRAMYCIN) 100 MG capsule Take 1 capsule (100 mg total) by mouth 2 (two) times daily for 7 days. 10/05/23 10/12/23 Yes Radford Pax, NP  dupilumab (DUPIXENT) 300 MG/2ML prefilled syringe  02/27/22  Yes [provider]  furosemide (LASIX) 20 MG tablet Take 1 tablet (20 mg total) by mouth daily as needed. 09/06/12  Yes Shelia Media, MD  glipiZIDE (GLUCOTROL XL) 5 MG 24 hr tablet Take by mouth. 01/11/18  Yes [provider]  levothyroxine (SYNTHROID,  LEVOTHROID) 75 MCG tablet Take 1 tablet (75 mcg total) by mouth daily. 05/14/16  Yes Shelia Media, MD  metoprolol succinate (TOPROL-XL) 25 MG 24 hr tablet Take 1 tablet by mouth daily. 04/16/23  Yes [provider]  albuterol (PROVENTIL) (2.5 MG/3ML) 0.083% nebulizer solution USE 1 VIAL VIA NEBULIZER EVERY 6 HOURS AS NEEDED FOR WHEEZING OR SHORTNESS OF BREATH. 07/26/18   Erin Fulling, MD  albuterol (VENTOLIN HFA) 108 (90 Base) MCG/ACT inhaler Inhale 2 puffs into the lungs every 4 (four) hours as needed. 07/23/18   Erin Fulling, MD  Blood Glucose Calibration (ACCU-CHEK AVIVA) SOLN 1 each by In Vitro route once. 05/17/13   Shelia Media, MD  Blood Glucose Monitoring Suppl (ONE TOUCH ULTRA SYSTEM KIT) W/DEVICE KIT 1 kit by Does not apply route once. Use as directed. Test blood sugars 1-2 times daily. 02/05/15   Shelia Media, MD  cetirizine (ZYRTEC) 10 MG tablet TAKE 1 TABLET(10 MG) BY MOUTH DAILY 05/18/19   Erin Fulling, MD  diclofenac sodium (VOLTAREN) 1 % GEL Apply 1 application topically 2 (two) times daily as needed.      [provider]  gentamicin ointment (GARAMYCIN) 0.1 % Apply 1 application topically 3 (three) times daily. Patient taking differently: Apply 1 application topically 3 (three) times daily as needed.  08/22/15   Shelia Media, MD  glucose blood test strip 1 each by Other route as needed for other. Use as instructed with One Touch Ultra Glucometer. Test blood sugars at home 1-2 times daily 02/05/15   Shelia Media, MD  losartan (COZAAR) 25 MG tablet Take 25 mg by mouth daily. 03/13/17 10/19/20  [provider]  metFORMIN (GLUCOPHAGE) 500 MG tablet Take 1 tablet (500 mg total) by mouth 2 (two) times daily with a meal. 05/14/16 07/10/19  Shelia Media, MD  nystatin (MYCOSTATIN) 100000 UNIT/ML suspension  10/29/15   [provider]  nystatin (MYCOSTATIN/NYSTOP) 100000 UNIT/GM POWD Apply twice daily as needed 09/19/15   Shelia Media,  MD  ondansetron (ZOFRAN) 8 MG tablet Take 1 tablet (8 mg total) by mouth every 8 (eight) hours as needed for nausea or vomiting. 12/03/15   Shelia Media, MD  pantoprazole (PROTONIX) 40 MG tablet Take 1 tablet (40 mg total) by mouth 2 (two) times daily. 05/14/16 07/10/19  Shelia Media, MD  prednisoLONE (ORAPRED ODT) 10 MG disintegrating tablet Take 10 mg by mouth daily.    [provider]  Robbie Louis 250-50 MCG/DOSE AEPB INHALE 1 PUFF INTO THE LUNGS TWICE DAILY 03/09/19   Erin Fulling, MD    Family History Family History  Problem Relation Age of Onset   Peripheral vascular disease Mother    Heart disease Mother    COPD Mother  Dementia Father    ALS Brother    Heart disease Brother        s/p CABG   Breast cancer Cousin    Breast cancer Cousin     Social History Social History   Tobacco Use   Smoking status: Never   Smokeless tobacco: Never  Vaping Use   Vaping status: Never Used  Substance Use Topics   Alcohol use: No    Alcohol/week: 0.0 standard drinks of alcohol   Drug use: No     Allergies   Aspirin, Cephalexin, Codeine, Penicillins, and Sulfa antibiotics   Review of Systems Review of Systems  Musculoskeletal:        Left toe pain      Physical Exam Triage Vital Signs ED Triage Vitals [10/05/23 1454]  Encounter Vitals Group     BP (!) 181/76     Systolic BP Percentile      Diastolic BP Percentile      Pulse Rate 71     Resp 19     Temp 98.1 F (36.7 C)     Temp src      SpO2 97 %     Weight      Height      Head Circumference      Peak Flow      Pain Score      Pain Loc      Pain Education      Exclude from Growth Chart    No data found.  Updated Vital Signs BP (!) 181/76 (BP Location: Right Wrist)   Pulse 71   Temp 98.1 F (36.7 C)   Resp 19   SpO2 97%   Visual Acuity Right Eye Distance:   Left Eye Distance:   Bilateral Distance:    Right Eye Near:   Left Eye Near:    Bilateral Near:     Physical  Exam Vitals and nursing note reviewed.  Constitutional:      General: She is not in acute distress.    Appearance: Normal appearance. She is not ill-appearing.  HENT:     Head: Normocephalic and atraumatic.  Eyes:     Pupils: Pupils are equal, round, and reactive to light.  Cardiovascular:     Rate and Rhythm: Normal rate.  Pulmonary:     Effort: Pulmonary effort is normal.  Abdominal:     Tenderness: There is no right CVA tenderness or left CVA tenderness.  Musculoskeletal:       Feet:  Feet:     Comments: No swelling left third and fourth toes.  There is tenderness to palpation to the third and fourth MTP joint that extends slightly to the mid toe.  Cap refill +2.  No tenderness to fifth, second, first toe.  Pain with dorsiflexion of the foot.  DP +2. Skin:    General: Skin is warm and dry.  Neurological:     General: No focal deficit present.     Mental Status: She is alert and oriented to person, place, and time.  Psychiatric:        Mood and Affect: Mood normal.        Behavior: Behavior normal.      UC Treatments / Results  Labs (all labs ordered are listed, but only abnormal results are displayed) Labs Reviewed  URINALYSIS, W/ REFLEX TO CULTURE (INFECTION SUSPECTED) - Abnormal; Notable for the following components:      Result Value   APPearance HAZY (*)  Leukocytes,Ua SMALL (*)    Bacteria, UA FEW (*)    All other components within normal limits  URINE CULTURE    EKG   Radiology DG Foot Complete Left  Result Date: 10/05/2023 CLINICAL DATA:  Third and fourth toe pain after injury. EXAM: LEFT FOOT - COMPLETE 3+ VIEW COMPARISON:  None Available. FINDINGS: There appears to be a mildly displaced fracture involving the distal portion of the third proximal phalanx. Mild posterior calcaneal spurring is noted. Mild degenerative changes are seen involving several tarsal metatarsal joints. IMPRESSION: Probable mildly displaced fracture involving third proximal  phalanx. Electronically Signed   By: Lupita Raider M.D.   On: 10/05/2023 16:30    Procedures Procedures (including critical care time)  Medications Ordered in UC Medications - No data to display  Initial Impression / Assessment and Plan / UC Course  I have reviewed the triage vital signs and the nursing notes.  Pertinent labs & imaging results that were available during my care of the patient were reviewed by me and considered in my medical decision making (see chart for details).     UA positive for UTI, will culture.  Start doxycycline.  Patient does have a lot of medication allergies.  Reviewed foot x-ray, no obvious fractures.  Will contact for any positive results based on radiology overread once available.  Discussed likely toe sprain and RICE therapy.  Toes were buddy taped.  Advised OTC analgesics as needed.  PCP follow-up if symptoms do not improve.  ER precautions reviewed.   Addendum 1640: Radiology overread shows possible third proximal phalanx fracture.LVM for pt to call clinic.   Addendum 11/26 1120: Contacted patient who verified date of birth.  Discussed radiology results.  Advised does not change treatment and to continue to buddy tape, RICE therapy, OTC analgesics as needed.  Advised PCP or podiatry follow-up next week.  ER precautions reviewed. Final Clinical Impressions(s) / UC Diagnoses   Final diagnoses:  Toe pain, left  Acute cystitis without hematuria     Discharge Instructions      The clinic will contact you with results of the urine culture done today if positive.  Start doxycycline twice daily for 7 days.  May keep your left third and fourth toes buddy taped together for support.  Elevate, ice and take over-the-counter Tylenol or ibuprofen as needed.  Please follow-up with your PCP if your symptoms do not improve.  Please go to the ER for any worsening symptoms.  I hope you feel better soon!     ED Prescriptions     Medication Sig Dispense Auth.  Provider   doxycycline (VIBRAMYCIN) 100 MG capsule Take 1 capsule (100 mg total) by mouth 2 (two) times daily for 7 days. 14 capsule Radford Pax, NP      PDMP not reviewed this encounter.   Radford Pax, NP 10/05/23 1613    Radford Pax, NP 10/05/23 1613    Radford Pax, NP 10/05/23 1614    Radford Pax, NP 10/05/23 1642    Radford Pax, NP 10/06/23 1121

## 2023-10-06 LAB — URINE CULTURE: Culture: NO GROWTH
